# Patient Record
Sex: Male | Born: 1955
Health system: Southern US, Community
[De-identification: ages and names within clinical notes are randomized; demographics above are authoritative.]

## PROBLEM LIST (undated history)

## (undated) DIAGNOSIS — T7840XA Allergy, unspecified, initial encounter: Secondary | ICD-10-CM

## (undated) DIAGNOSIS — I10 Essential (primary) hypertension: Secondary | ICD-10-CM

## (undated) DIAGNOSIS — I700 Atherosclerosis of aorta: Secondary | ICD-10-CM

## (undated) DIAGNOSIS — IMO0002 Reserved for concepts with insufficient information to code with codable children: Secondary | ICD-10-CM

## (undated) HISTORY — DX: Allergy, unspecified, initial encounter: T78.40XA

## (undated) HISTORY — DX: Reserved for concepts with insufficient information to code with codable children: IMO0002

## (undated) HISTORY — DX: Atherosclerosis of aorta: I70.0

## (undated) HISTORY — DX: Essential (primary) hypertension: I10

---
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
## 2016-01-31 DIAGNOSIS — E785 Hyperlipidemia, unspecified: Secondary | ICD-10-CM

## 2016-01-31 HISTORY — DX: Hyperlipidemia, unspecified: E78.5

## 2020-11-24 ENCOUNTER — Ambulatory Visit (INDEPENDENT_AMBULATORY_CARE_PROVIDER_SITE_OTHER): Payer: Medicare HMO | Admitting: Family Medicine

## 2020-11-24 ENCOUNTER — Other Ambulatory Visit: Payer: Self-pay

## 2020-11-24 ENCOUNTER — Ambulatory Visit (INDEPENDENT_AMBULATORY_CARE_PROVIDER_SITE_OTHER): Payer: Medicare HMO

## 2020-11-24 ENCOUNTER — Encounter: Payer: Self-pay | Admitting: Family Medicine

## 2020-11-24 VITALS — BP 132/86 | HR 70 | Temp 98.5°F | Ht 69.0 in | Wt 209.6 lb

## 2020-11-24 DIAGNOSIS — R252 Cramp and spasm: Secondary | ICD-10-CM

## 2020-11-24 DIAGNOSIS — M19012 Primary osteoarthritis, left shoulder: Secondary | ICD-10-CM | POA: Diagnosis not present

## 2020-11-24 DIAGNOSIS — G5603 Carpal tunnel syndrome, bilateral upper limbs: Secondary | ICD-10-CM | POA: Diagnosis not present

## 2020-11-24 DIAGNOSIS — K219 Gastro-esophageal reflux disease without esophagitis: Secondary | ICD-10-CM

## 2020-11-24 DIAGNOSIS — G8929 Other chronic pain: Secondary | ICD-10-CM

## 2020-11-24 DIAGNOSIS — E785 Hyperlipidemia, unspecified: Secondary | ICD-10-CM | POA: Diagnosis not present

## 2020-11-24 DIAGNOSIS — Z114 Encounter for screening for human immunodeficiency virus [HIV]: Secondary | ICD-10-CM

## 2020-11-24 DIAGNOSIS — M25512 Pain in left shoulder: Secondary | ICD-10-CM | POA: Diagnosis not present

## 2020-11-24 DIAGNOSIS — Z125 Encounter for screening for malignant neoplasm of prostate: Secondary | ICD-10-CM | POA: Diagnosis not present

## 2020-11-24 DIAGNOSIS — Z1159 Encounter for screening for other viral diseases: Secondary | ICD-10-CM | POA: Diagnosis not present

## 2020-11-24 DIAGNOSIS — M1990 Unspecified osteoarthritis, unspecified site: Secondary | ICD-10-CM | POA: Diagnosis not present

## 2020-11-24 HISTORY — DX: Carpal tunnel syndrome, bilateral upper limbs: G56.03

## 2020-11-24 HISTORY — DX: Cramp and spasm: R25.2

## 2020-11-24 HISTORY — DX: Unspecified osteoarthritis, unspecified site: M19.90

## 2020-11-24 HISTORY — DX: Gastro-esophageal reflux disease without esophagitis: K21.9

## 2020-11-24 HISTORY — DX: Other chronic pain: G89.29

## 2020-11-24 MED ORDER — PANTOPRAZOLE SODIUM 40 MG PO TBEC
40.0000 mg | DELAYED_RELEASE_TABLET | Freq: Every day | ORAL | 1 refills | Status: DC
Start: 2020-11-24 — End: 2021-01-29

## 2020-11-24 MED ORDER — CYCLOBENZAPRINE HCL 10 MG PO TABS: 10.0000 mg | ORAL_TABLET | Freq: Every day | ORAL | 2 refills | Status: DC | PRN

## 2020-11-24 MED ORDER — MELOXICAM 15 MG PO TABS: 15.0000 mg | ORAL_TABLET | Freq: Every day | ORAL | 1 refills | Status: DC

## 2020-11-24 NOTE — Progress Notes (Signed)
New Patient Office Visit  Assessment & Plan:  1. Gastroesophageal reflux disease, unspecified whether esophagitis present Well controlled on current regimen.  - pantoprazole (PROTONIX) 40 MG tablet; Take 1 tablet (40 mg total) by mouth daily.  Dispense: 90 tablet; Refill: 1 - CMP14+EGFR; Future  2. Arthritis Well controlled on current regimen.  - meloxicam (MOBIC) 15 MG tablet; Take 1 tablet (15 mg total) by mouth daily.  Dispense: 90 tablet; Refill: 1 - CMP14+EGFR; Future  3. Leg cramps - cyclobenzaprine (FLEXERIL) 10 MG tablet; Take 1 tablet (10 mg total) by mouth daily as needed for muscle spasms.  Dispense: 30 tablet; Refill: 2 - CMP14+EGFR; Future - TSH; Future - Magnesium; Future - Anemia Profile B; Future  4. Bilateral carpal tunnel syndrome - Ambulatory referral to Orthopedic Surgery  5. Chronic left shoulder pain Exercises provided for patient to complete at home. - DG Shoulder Left - Ambulatory referral to Orthopedic Surgery - Ambulatory referral to Physical Therapy  6. Hyperlipidemia LDL goal <100 Labs to assess. - Lipid panel; Future  7. Prostate cancer screening - PSA, total and free; Future  8. Encounter for hepatitis C screening test for low risk patient - Hepatitis C antibody; Future  9. Encounter for screening for HIV - HIV antibody (with reflex); Future   Follow-up: Return in about 6 weeks (around 01/05/2021) for follow-up of chronic medication conditions.   Hendricks Limes, MSN, APRN, FNP-C [facility]  Subjective:  [name], male    DOB: July 21, 1956  Age: 65 y.o. MRN: [id number]  Patient Care Team: Loman Brooklyn, FNP as PCP - General (Family Medicine)  CC:  Chief Complaint  Patient presents with  . New Patient (Initial Visit)    [facility]   . Establish Care  . Shoulder Pain    Patient states he has been having ongoing left shoulder pain that has been ongoing- no known injury      HPI James Davenport presents to establish care.   GERD: Controlled with Protonix.  Patient states he takes Flexeril due to leg cramps that have been going on for years.  He takes meloxicam for arthritis pains in his back, shoulders, hands, and knees.  He does report his knees have improved greatly as he has lost 32 pounds in the past 3 months.  He has done this by cutting back on soda and portion control.  Patient reports he has carpal tunnel syndrome bilaterally but has never seen an orthopedic and was unaware surgery was an option.  He is also having chronic left shoulder pain that has been going on for years.  He reports he has probably fallen on it 10 times over the years working in [employer].  He has pain with both resting and activity.  He reports the day before yesterday was the worst day ever for this pain.  He has never done physical therapy.   Review of Systems  Constitutional: Positive for weight loss (intentional). Negative for chills, fever and malaise/fatigue.  HENT: Negative for congestion, ear discharge, ear pain, nosebleeds, sinus pain, sore throat and tinnitus.   Eyes: Negative for blurred vision, double vision, pain, discharge and redness.  Respiratory: Negative for cough, shortness of breath and wheezing.   Cardiovascular: Negative for chest pain, palpitations and leg swelling.  Gastrointestinal: Positive for heartburn. Negative for abdominal pain, constipation, diarrhea, nausea and vomiting.  Genitourinary: Negative for dysuria, frequency and urgency.       Denies trouble initiating a  urine stream, weak stream, split stream, nocturia, and dribbling.   Musculoskeletal: Positive for back pain and joint pain. Negative for myalgias.  Skin: Negative for rash.  Neurological: Negative for dizziness, seizures, weakness and headaches.  Psychiatric/Behavioral: Negative for depression, substance abuse and suicidal ideas. The patient is not nervous/anxious.     Current  Outpatient Medications:  .  acetaminophen (TYLENOL) 500 MG tablet, Take 500 mg by mouth every 6 (six) hours as needed., Disp: , Rfl:  .  aspirin 81 MG chewable tablet, Chew 1 tablet by mouth daily at 2 PM., Disp: , Rfl:  .  cyclobenzaprine (FLEXERIL) 10 MG tablet, Take 1 tablet (10 mg total) by mouth daily as needed for muscle spasms., Disp: 30 tablet, Rfl: 2 .  meloxicam (MOBIC) 15 MG tablet, Take 1 tablet (15 mg total) by mouth daily., Disp: 90 tablet, Rfl: 1 .  pantoprazole (PROTONIX) 40 MG tablet, Take 1 tablet (40 mg total) by mouth daily., Disp: 90 tablet, Rfl: 1  Allergies  Allergen Reactions  . Tramadol Nausea And Vomiting    Past Medical History:  Diagnosis Date  . Hypertension     Past Surgical History:  Procedure Laterality Date  . LEG SURGERY Right 1975    Family History  Problem Relation Age of Onset  . Lung disease Mother   . Hypertension Mother   . Asthma Daughter     Social History   Socioeconomic History  . Marital status: Single    Spouse name: Not on file  . Number of children: Not on file  . Years of education: Not on file  . Highest education level: Not on file  Occupational History  . Not on file  Tobacco Use  . Smoking status: Former Smoker    Types: Cigarettes    Quit date: 1990    Years since quitting: 32.1  . Smokeless tobacco: Never Used  Vaping Use  . Vaping Use: Never used  Substance and Sexual Activity  . Alcohol use: Yes    Comment: occ  . Drug use: Never  . Sexual activity: Not on file  Other Topics Concern  . Not on file  Social History Narrative  . Not on file   Social Determinants of Health   Financial Resource Strain: Not on file  Food Insecurity: Not on file  Transportation Needs: Not on file  Physical Activity: Not on file  Stress: Not on file  Social Connections: Not on file  Intimate Partner Violence: Not on file    Objective:   Today's Vitals: BP 132/86 Comment: manual  Pulse 70   Temp 98.5 F (36.9 C)  (Temporal)   Ht 5' 9" (1.753 m)   Wt 209 lb 9.6 oz (95.1 kg)   SpO2 93%   BMI 30.95 kg/m   Physical Exam Vitals reviewed.  Constitutional:      General: He is not in acute distress.    Appearance: Normal appearance. He is obese. He is not ill-appearing, toxic-appearing or diaphoretic.  HENT:     Head: Normocephalic and atraumatic.  Eyes:     General: No scleral icterus.       Right eye: No discharge.        Left eye: No discharge.     Conjunctiva/sclera: Conjunctivae normal.  Cardiovascular:     Rate and Rhythm: Normal rate and regular rhythm.     Heart sounds: Normal heart sounds. No murmur heard. No friction rub. No gallop.   Pulmonary:     Effort:  Pulmonary effort is normal. No respiratory distress.     Breath sounds: Normal breath sounds. No stridor. No wheezing, rhonchi or rales.  Musculoskeletal:     Left shoulder: Tenderness present. No swelling, deformity, effusion or laceration. Decreased range of motion.     Cervical back: Normal range of motion.  Skin:    General: Skin is warm and dry.  Neurological:     Mental Status: He is alert and oriented to person, place, and time. Mental status is at baseline.  Psychiatric:        Mood and Affect: Mood normal.        Behavior: Behavior normal.        Thought Content: Thought content normal.        Judgment: Judgment normal.

## 2020-11-24 NOTE — Patient Instructions (Signed)
Shoulder Exercises Ask your health care provider which exercises are safe for you. Do exercises exactly as told by your health care provider and adjust them as directed. It is normal to feel mild stretching, pulling, tightness, or discomfort as you do these exercises. Stop right away if you feel sudden pain or your pain gets worse. Do not begin these exercises until told by your health care provider. Stretching exercises External rotation and abduction This exercise is sometimes called corner stretch. This exercise rotates your arm outward (external rotation) and moves your arm out from your body (abduction). 1. Stand in a doorway with one of your feet slightly in front of the other. This is called a staggered stance. If you cannot reach your forearms to the door frame, stand facing a corner of a room. 2. Choose one of the following positions as told by your health care provider: ? Place your hands and forearms on the door frame above your head. ? Place your hands and forearms on the door frame at the height of your head. ? Place your hands on the door frame at the height of your elbows. 3. Slowly move your weight onto your front foot until you feel a stretch across your chest and in the front of your shoulders. Keep your head and chest upright and keep your abdominal muscles tight. 4. Hold for __________ seconds. 5. To release the stretch, shift your weight to your back foot. Repeat __________ times. Complete this exercise __________ times a day.   Extension, standing 1. Stand and hold a broomstick, a cane, or a similar object behind your back. ? Your hands should be a little wider than shoulder width apart. ? Your palms should face away from your back. 2. Keeping your elbows straight and your shoulder muscles relaxed, move the stick away from your body until you feel a stretch in your shoulders (extension). ? Avoid shrugging your shoulders while you move the stick. Keep your shoulder blades  tucked down toward the middle of your back. 3. Hold for __________ seconds. 4. Slowly return to the starting position. Repeat __________ times. Complete this exercise __________ times a day. Range-of-motion exercises Pendulum 1. Stand near a wall or a surface that you can hold onto for balance. 2. Bend at the waist and let your left / right arm hang straight down. Use your other arm to support you. Keep your back straight and do not lock your knees. 3. Relax your left / right arm and shoulder muscles, and move your hips and your trunk so your left / right arm swings freely. Your arm should swing because of the motion of your body, not because you are using your arm or shoulder muscles. 4. Keep moving your hips and trunk so your arm swings in the following directions, as told by your health care provider: ? Side to side. ? Forward and backward. ? In clockwise and counterclockwise circles. 5. Continue each motion for __________ seconds, or for as long as told by your health care provider. 6. Slowly return to the starting position. Repeat __________ times. Complete this exercise __________ times a day.   Shoulder flexion, standing 1. Stand and hold a broomstick, a cane, or a similar object. Place your hands a little more than shoulder width apart on the object. Your left / right hand should be palm up, and your other hand should be palm down. 2. Keep your elbow straight and your shoulder muscles relaxed. Push the stick up with your healthy   arm to raise your left / right arm in front of your body, and then over your head until you feel a stretch in your shoulder (flexion). ? Avoid shrugging your shoulder while you raise your arm. Keep your shoulder blade tucked down toward the middle of your back. 3. Hold for __________ seconds. 4. Slowly return to the starting position. Repeat __________ times. Complete this exercise __________ times a day.   Shoulder abduction, standing 1. Stand and hold a  broomstick, a cane, or a similar object. Place your hands a little more than shoulder width apart on the object. Your left / right hand should be palm up, and your other hand should be palm down. 2. Keep your elbow straight and your shoulder muscles relaxed. Push the object across your body toward your left / right side. Raise your left / right arm to the side of your body (abduction) until you feel a stretch in your shoulder. ? Do not raise your arm above shoulder height unless your health care provider tells you to do that. ? If directed, raise your arm over your head. ? Avoid shrugging your shoulder while you raise your arm. Keep your shoulder blade tucked down toward the middle of your back. 3. Hold for __________ seconds. 4. Slowly return to the starting position. Repeat __________ times. Complete this exercise __________ times a day. Internal rotation 1. Place your left / right hand behind your back, palm up. 2. Use your other hand to dangle an exercise band, a towel, or a similar object over your shoulder. Grasp the band with your left / right hand so you are holding on to both ends. 3. Gently pull up on the band until you feel a stretch in the front of your left / right shoulder. The movement of your arm toward the center of your body is called internal rotation. ? Avoid shrugging your shoulder while you raise your arm. Keep your shoulder blade tucked down toward the middle of your back. 4. Hold for __________ seconds. 5. Release the stretch by letting go of the band and lowering your hands. Repeat __________ times. Complete this exercise __________ times a day.   Strengthening exercises External rotation 1. Sit in a stable chair without armrests. 2. Secure an exercise band to a stable object at elbow height on your left / right side. 3. Place a soft object, such as a folded towel or a small pillow, between your left / right upper arm and your body to move your elbow about 4 inches (10  cm) away from your side. 4. Hold the end of the exercise band so it is tight and there is no slack. 5. Keeping your elbow pressed against the soft object, slowly move your forearm out, away from your abdomen (external rotation). Keep your body steady so only your forearm moves. 6. Hold for __________ seconds. 7. Slowly return to the starting position. Repeat __________ times. Complete this exercise __________ times a day.   Shoulder abduction 1. Sit in a stable chair without armrests, or stand up. 2. Hold a __________ weight in your left / right hand, or hold an exercise band with both hands. 3. Start with your arms straight down and your left / right palm facing in, toward your body. 4. Slowly lift your left / right hand out to your side (abduction). Do not lift your hand above shoulder height unless your health care provider tells you that this is safe. ? Keep your arms straight. ? Avoid   shrugging your shoulder while you do this movement. Keep your shoulder blade tucked down toward the middle of your back. 5. Hold for __________ seconds. 6. Slowly lower your arm, and return to the starting position. Repeat __________ times. Complete this exercise __________ times a day.   Shoulder extension 1. Sit in a stable chair without armrests, or stand up. 2. Secure an exercise band to a stable object in front of you so it is at shoulder height. 3. Hold one end of the exercise band in each hand. Your palms should face each other. 4. Straighten your elbows and lift your hands up to shoulder height. 5. Step back, away from the secured end of the exercise band, until the band is tight and there is no slack. 6. Squeeze your shoulder blades together as you pull your hands down to the sides of your thighs (extension). Stop when your hands are straight down by your sides. Do not let your hands go behind your body. 7. Hold for __________ seconds. 8. Slowly return to the starting position. Repeat __________  times. Complete this exercise __________ times a day. Shoulder row 1. Sit in a stable chair without armrests, or stand up. 2. Secure an exercise band to a stable object in front of you so it is at waist height. 3. Hold one end of the exercise band in each hand. Position your palms so that your thumbs are facing the ceiling (neutral position). 4. Bend each of your elbows to a 90-degree angle (right angle) and keep your upper arms at your sides. 5. Step back until the band is tight and there is no slack. 6. Slowly pull your elbows back behind you. 7. Hold for __________ seconds. 8. Slowly return to the starting position. Repeat __________ times. Complete this exercise __________ times a day. Shoulder press-ups 1. Sit in a stable chair that has armrests. Sit upright, with your feet flat on the floor. 2. Put your hands on the armrests so your elbows are bent and your fingers are pointing forward. Your hands should be about even with the sides of your body. 3. Push down on the armrests and use your arms to lift yourself off the chair. Straighten your elbows and lift yourself up as much as you comfortably can. ? Move your shoulder blades down, and avoid letting your shoulders move up toward your ears. ? Keep your feet on the ground. As you get stronger, your feet should support less of your body weight as you lift yourself up. 4. Hold for __________ seconds. 5. Slowly lower yourself back into the chair. Repeat __________ times. Complete this exercise __________ times a day.   Wall push-ups 1. Stand so you are facing a stable wall. Your feet should be about one arm-length away from the wall. 2. Lean forward and place your palms on the wall at shoulder height. 3. Keep your feet flat on the floor as you bend your elbows and lean forward toward the wall. 4. Hold for __________ seconds. 5. Straighten your elbows to push yourself back to the starting position. Repeat __________ times. Complete this  exercise __________ times a day.   This information is not intended to replace advice given to you by your health care provider. Make sure you discuss any questions you have with your health care provider. Document Revised: 01/15/2019 Document Reviewed: 10/23/2018 Elsevier Patient Education  2021 [employer]  

## 2020-11-26 ENCOUNTER — Encounter: Payer: Self-pay | Admitting: Family Medicine

## 2020-11-26 DIAGNOSIS — I700 Atherosclerosis of aorta: Secondary | ICD-10-CM | POA: Insufficient documentation

## 2020-11-29 ENCOUNTER — Other Ambulatory Visit: Payer: Self-pay

## 2020-11-29 ENCOUNTER — Other Ambulatory Visit: Payer: Medicare HMO

## 2020-11-29 DIAGNOSIS — Z1159 Encounter for screening for other viral diseases: Secondary | ICD-10-CM

## 2020-11-29 DIAGNOSIS — Z114 Encounter for screening for human immunodeficiency virus [HIV]: Secondary | ICD-10-CM | POA: Diagnosis not present

## 2020-11-29 DIAGNOSIS — Z125 Encounter for screening for malignant neoplasm of prostate: Secondary | ICD-10-CM | POA: Diagnosis not present

## 2020-11-29 DIAGNOSIS — R252 Cramp and spasm: Secondary | ICD-10-CM

## 2020-11-29 DIAGNOSIS — M1990 Unspecified osteoarthritis, unspecified site: Secondary | ICD-10-CM | POA: Diagnosis not present

## 2020-11-29 DIAGNOSIS — K219 Gastro-esophageal reflux disease without esophagitis: Secondary | ICD-10-CM

## 2020-11-29 DIAGNOSIS — E785 Hyperlipidemia, unspecified: Secondary | ICD-10-CM | POA: Diagnosis not present

## 2020-11-30 LAB — ANEMIA PROFILE B
Basophils Absolute: 0.1 10*3/uL (ref 0.0–0.2)
Basos: 1 %
EOS (ABSOLUTE): 0.2 10*3/uL (ref 0.0–0.4)
Eos: 2 %
Ferritin: 104 ng/mL (ref 30–400)
Folate: 4.5 ng/mL (ref >3.0)
Hematocrit: 48.7 % (ref 37.5–51.0)
Hemoglobin: 16.4 g/dL (ref 13.0–17.7)
Immature Grans (Abs): 0 10*3/uL (ref 0.0–0.1)
Immature Granulocytes: 0 %
Iron Saturation: 28 % (ref 15–55)
Iron: 83 ug/dL (ref 38–169)
Lymphocytes Absolute: 2.2 10*3/uL (ref 0.7–3.1)
Lymphs: 33 %
MCH: 29.1 pg (ref 26.6–33.0)
MCHC: 33.7 g/dL (ref 31.5–35.7)
MCV: 87 fL (ref 79–97)
Monocytes Absolute: 0.6 10*3/uL (ref 0.1–0.9)
Monocytes: 9 %
Neutrophils Absolute: 3.6 10*3/uL (ref 1.4–7.0)
Neutrophils: 55 %
Platelets: 219 10*3/uL (ref 150–450)
RBC: 5.63 x10E6/uL (ref 4.14–5.80)
RDW: 13 % (ref 11.6–15.4)
Retic Ct Pct: 1.8 % (ref 0.6–2.6)
Total Iron Binding Capacity: 297 ug/dL (ref 250–450)
UIBC: 214 ug/dL (ref 111–343)
Vitamin B-12: 375 pg/mL (ref 232–1245)
WBC: 6.7 10*3/uL (ref 3.4–10.8)

## 2020-11-30 LAB — LIPID PANEL
Chol/HDL Ratio: 3.8 ratio (ref 0.0–5.0)
Cholesterol, Total: 188 mg/dL (ref 100–199)
HDL: 50 mg/dL (ref >39)
LDL Chol Calc (NIH): 119 mg/dL — ABNORMAL HIGH (ref 0–99)
Triglycerides: 105 mg/dL (ref 0–149)
VLDL Cholesterol Cal: 19 mg/dL (ref 5–40)

## 2020-11-30 LAB — PSA, TOTAL AND FREE
PSA, Free Pct: 24.3 %
PSA, Free: 0.17 ng/mL
Prostate Specific Ag, Serum: 0.7 ng/mL (ref 0.0–4.0)

## 2020-11-30 LAB — CMP14+EGFR
ALT: 27 IU/L (ref 0–44)
AST: 27 IU/L (ref 0–40)
Albumin/Globulin Ratio: 1.7 (ref 1.2–2.2)
Albumin: 4.2 g/dL (ref 3.8–4.8)
Alkaline Phosphatase: 81 IU/L (ref 44–121)
BUN/Creatinine Ratio: 20 (ref 10–24)
BUN: 20 mg/dL (ref 8–27)
Bilirubin Total: 0.4 mg/dL (ref 0.0–1.2)
CO2: 20 mmol/L (ref 20–29)
Calcium: 8.8 mg/dL (ref 8.6–10.2)
Chloride: 104 mmol/L (ref 96–106)
Creatinine, Ser: 0.98 mg/dL (ref 0.76–1.27)
GFR calc Af Amer: 93 mL/min/{1.73_m2} (ref >59)
GFR calc non Af Amer: 81 mL/min/{1.73_m2} (ref >59)
Globulin, Total: 2.5 g/dL (ref 1.5–4.5)
Glucose: 100 mg/dL — ABNORMAL HIGH (ref 65–99)
Potassium: 4.5 mmol/L (ref 3.5–5.2)
Sodium: 142 mmol/L (ref 134–144)
Total Protein: 6.7 g/dL (ref 6.0–8.5)

## 2020-11-30 LAB — TSH: TSH: 1.26 u[IU]/mL (ref 0.450–4.500)

## 2020-11-30 LAB — HIV ANTIBODY (ROUTINE TESTING W REFLEX): HIV Screen 4th Generation wRfx: NONREACTIVE

## 2020-11-30 LAB — HEPATITIS C ANTIBODY: Hep C Virus Ab: <0.1 s/co ratio (ref 0.0–0.9)

## 2020-11-30 LAB — MAGNESIUM: Magnesium: 1.9 mg/dL (ref 1.6–2.3)

## 2020-12-01 ENCOUNTER — Telehealth: Payer: Self-pay | Admitting: Family Medicine

## 2020-12-01 DIAGNOSIS — E785 Hyperlipidemia, unspecified: Secondary | ICD-10-CM

## 2020-12-01 MED ORDER — ATORVASTATIN CALCIUM 10 MG PO TABS: 10.0000 mg | ORAL_TABLET | Freq: Every day | ORAL | 2 refills | Status: DC

## 2020-12-01 NOTE — Telephone Encounter (Signed)
Rx'd Atorvastatin.

## 2020-12-01 NOTE — Telephone Encounter (Signed)
Lmtcb.

## 2020-12-01 NOTE — Telephone Encounter (Signed)
Pt calling to let Britney know that he is willing to try whichever cholesterol medication she thinks what be best for him

## 2020-12-05 ENCOUNTER — Encounter: Payer: Self-pay | Admitting: Orthopedic Surgery

## 2020-12-05 ENCOUNTER — Ambulatory Visit (INDEPENDENT_AMBULATORY_CARE_PROVIDER_SITE_OTHER): Payer: Medicare HMO | Admitting: Orthopedic Surgery

## 2020-12-05 ENCOUNTER — Other Ambulatory Visit: Payer: Self-pay

## 2020-12-05 VITALS — BP 175/101 | HR 79 | Ht 69.0 in | Wt 210.0 lb

## 2020-12-05 DIAGNOSIS — M19012 Primary osteoarthritis, left shoulder: Secondary | ICD-10-CM

## 2020-12-05 DIAGNOSIS — G5603 Carpal tunnel syndrome, bilateral upper limbs: Secondary | ICD-10-CM

## 2020-12-05 NOTE — Progress Notes (Signed)
New Patient Visit  Assessment: James Davenport is a 65 y.o. male with the following: Left shoulder glenohumeral arthritis Bilateral carpal tunnel syndrome; right worse than left, symptoms currently mild  Plan: James Davenport has multiple complaints in clinic today.  He states that his left shoulder is bothering him the most.  We reviewed the radiographs which demonstrates glenohumeral arthritis.  He is taking the appropriate medications, and continues to be active.  We briefly discussed proceeding with a steroid injection should his symptoms worsen.  He can contact the clinic if he wishes to proceed with an injection at any time.  Regarding his numbness and tingling in bilateral hands, the right is currently worse than the left.  His description of symptoms are overall mild, but consistent with carpal tunnel syndrome.  We discussed trying a cock-up wrist splint to be worn at night, and he is elected to proceed.  He was fitted for this brace today.  Should he have any issues in the future, I have asked him to contact the clinic for follow-up appointment.   Follow-up: No follow-ups on file.  Subjective:  Chief Complaint  Patient presents with  . Hand Pain    Bilateral hand pain, Patient reports hands have been worse in last few weeks.   . Shoulder Pain    Left shoulder pain, Patient reports that left shoulder has been having some numbness and tingling worse in the last week,     History of Present Illness: James Davenport is a 65 y.o. male who has been referred to clinic today by Delight Ovens, [place] for evaluation of bilateral hand pain.  In addition, he has been having left shoulder pain.  His left shoulder is currently most problematic.  He states approximately 1 week ago, he he had an acute worsening.  Otherwise, he has had pain in his left shoulder for several years.  No specific injury.  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  He has been taking NSAIDs, as well as Tylenol,  which does help with some of his pain.  Occasionally, will keep him up at night.  He localizes the pain to the superior aspect of the shoulder, and notes that it is deep within the shoulder joint.  In regards to his hands, he notes some sharp pains, specifically when he is doing lots of work with his hands.  In addition, he has some numbness and tingling.  Occasionally, this will be worse at night.  He also notes worsening of his symptoms if he is driving for extended periods of time.  If he is doing work, he has been wearing a brace, primarily stabilizing his right thumb.  No brace at nighttime.   Review of Systems: No fevers or chills + numbness or tingling No chest pain No shortness of breath No bowel or bladder dysfunction No GI distress No headaches   Medical History:  Past Medical History:  Diagnosis Date  . Aortic atherosclerosis ([place])   . Arthritis 11/24/2020  . Bilateral carpal tunnel syndrome 11/24/2020  . Chronic left shoulder pain 11/24/2020  . GERD (gastroesophageal reflux disease) 11/24/2020  . Hyperlipidemia LDL goal <100 01/31/2016  . Hypertension   . Leg cramps 11/24/2020    Past Surgical History:  Procedure Laterality Date  . LEG SURGERY Right 1975    Family History  Problem Relation Age of Onset  . Lung disease Mother   . Hypertension Mother   . Asthma Daughter    Social History   Tobacco  Use  . Smoking status: Former Smoker    Types: Cigarettes    Quit date: 1990    Years since quitting: 32.1  . Smokeless tobacco: Never Used  Vaping Use  . Vaping Use: Never used  Substance Use Topics  . Alcohol use: Yes    Comment: occ  . Drug use: Never    Allergies  Allergen Reactions  . Tramadol Nausea And Vomiting    Current Meds  Medication Sig  . acetaminophen (TYLENOL) 500 MG tablet Take 500 mg by mouth every 6 (six) hours as needed.  Marland Kitchen aspirin 81 MG chewable tablet Chew 1 tablet by mouth daily at 2 PM.  . atorvastatin (LIPITOR) 10 MG tablet Take 1  tablet (10 mg total) by mouth daily.  . cyclobenzaprine (FLEXERIL) 10 MG tablet Take 1 tablet (10 mg total) by mouth daily as needed for muscle spasms.  . meloxicam (MOBIC) 15 MG tablet Take 1 tablet (15 mg total) by mouth daily.  . pantoprazole (PROTONIX) 40 MG tablet Take 1 tablet (40 mg total) by mouth daily.    Objective: BP (!) 175/101   Pulse 79   Ht 5\' 9"  (1.753 m)   Wt 210 lb (95.3 kg)   BMI 31.01 kg/m   Physical Exam:  General: Alert and oriented, no acute distress Gait: Normal  Evaluation of his left shoulder demonstrates no deformity.  No atrophy is appreciated.  Forward flexion limited to 130 degrees, with some discomfort.  Internal rotation of the lumbar spine.  External rotation at his side limited to 25 degrees with some obvious discomfort.  Negative belly press.  5/5 infraspinatus.  4+/5 supraspinatus.  Evaluation of bilateral wrist demonstrates no deformity.  Grip strength is even bilaterally.  Mild positive Tinel's bilaterally.  Mild symptoms elicited with carpal tunnel compression testing.  He does have some arthritis throughout his fingers.  Sensation is intact otherwise.  Fingers are warm and well perfused.    IMAGING: I personally reviewed images previously obtained in clinic   A single AP view of the left shoulder was evaluated and demonstrates moderate glenohumeral arthritis with severe loss of joint space in the inferior aspect of the glenohumeral joint.  There is also inferior osteophytes on the humeral head, as well as the glenoid.   New Medications:  No orders of the defined types were placed in this encounter.     Mordecai Rasmussen, MD  12/05/2020 9:44 AM

## 2020-12-26 ENCOUNTER — Other Ambulatory Visit: Payer: Self-pay | Admitting: Family Medicine

## 2020-12-26 DIAGNOSIS — E785 Hyperlipidemia, unspecified: Secondary | ICD-10-CM

## 2020-12-26 NOTE — Telephone Encounter (Signed)
Pt has been seen since this telephone call, will close encounter.

## 2021-01-09 ENCOUNTER — Ambulatory Visit (INDEPENDENT_AMBULATORY_CARE_PROVIDER_SITE_OTHER): Payer: Medicare HMO | Admitting: Family Medicine

## 2021-01-09 ENCOUNTER — Encounter: Payer: Self-pay | Admitting: Family Medicine

## 2021-01-09 ENCOUNTER — Other Ambulatory Visit: Payer: Self-pay

## 2021-01-09 VITALS — BP 132/84 | HR 68 | Temp 97.9°F | Ht 69.0 in | Wt 214.6 lb

## 2021-01-09 DIAGNOSIS — Z1211 Encounter for screening for malignant neoplasm of colon: Secondary | ICD-10-CM | POA: Diagnosis not present

## 2021-01-09 DIAGNOSIS — L219 Seborrheic dermatitis, unspecified: Secondary | ICD-10-CM | POA: Diagnosis not present

## 2021-01-09 DIAGNOSIS — Z86010 Personal history of colonic polyps: Secondary | ICD-10-CM

## 2021-01-09 DIAGNOSIS — J302 Other seasonal allergic rhinitis: Secondary | ICD-10-CM

## 2021-01-09 DIAGNOSIS — E785 Hyperlipidemia, unspecified: Secondary | ICD-10-CM

## 2021-01-09 DIAGNOSIS — I700 Atherosclerosis of aorta: Secondary | ICD-10-CM | POA: Diagnosis not present

## 2021-01-09 MED ORDER — KETOCONAZOLE 2 % EX CREA
1.0000 | TOPICAL_CREAM | Freq: Every day | CUTANEOUS | 0 refills | Status: DC
Start: 2021-01-09 — End: 2021-02-09

## 2021-01-09 MED ORDER — FLUTICASONE PROPIONATE 50 MCG/ACT NA SUSP: 2.0000 | Freq: Every day | NASAL | 6 refills | Status: DC

## 2021-01-09 MED ORDER — KETOCONAZOLE 2 % EX SHAM: 1.0000 "application " | MEDICATED_SHAMPOO | CUTANEOUS | 0 refills | Status: DC

## 2021-01-09 MED ORDER — PRAVASTATIN SODIUM 40 MG PO TABS: 40.0000 mg | ORAL_TABLET | Freq: Every day | ORAL | 2 refills | Status: DC

## 2021-01-09 MED ORDER — LEVOCETIRIZINE DIHYDROCHLORIDE 5 MG PO TABS: 5.0000 mg | ORAL_TABLET | Freq: Every evening | ORAL | 2 refills | Status: DC

## 2021-01-09 NOTE — Patient Instructions (Signed)
Seborrheic Dermatitis, Adult Seborrheic dermatitis is a skin disease that causes red, scaly patches. It usually occurs on the scalp, and it is often called dandruff. The patches may appear on other parts of the body. Skin patches tend to appear where there are many oil glands in the skin. Areas of the body that are commonly affected include the:  Scalp.  Ears.  Eyebrows.  Face.  Bearded area of [employer].  Skin folds of the body, such as the armpits, groin, and buttocks.  Chest. The condition may come and go for no known reason, and it is often long-lasting (chronic). What are the causes? The cause of this condition is not known. What increases the risk? The following factors may make you more likely to develop this condition:  Having certain conditions, such as: ? HIV (human immunodeficiency virus). ? AIDS (acquired immunodeficiency syndrome). ? Parkinson's disease. ? Mood disorders, such as depression.  Being 72-23 years old. What are the signs or symptoms? Symptoms of this condition include:  Thick scales on the scalp.  Redness on the face or in the armpits.  Skin that is flaky. The flakes may be white or yellow.  Skin that seems oily or dry but is not helped with moisturizers.  Itching or burning in the affected areas.   How is this diagnosed? This condition is diagnosed with a medical history and physical exam. A sample of your skin may be tested (skin biopsy). You may need to see a skin specialist (dermatologist). How is this treated? There is no cure for this condition, but treatment can help to manage the symptoms. You may get treatment to remove scales, lower the risk of skin infection, and reduce swelling or itching. Treatment may include:  Creams that reduce skin yeast.  Medicated shampoo.  Moisturizing creams or ointments.  Creams that reduce swelling and irritation (steroids). Follow these instructions at home:  Apply over-the-counter and  prescription medicines only as told by your health care provider.  Use any medicated shampoo, skin creams, or ointments only as told by your health care provider.  Keep all follow-up visits as told by your health care provider. This is important. Contact a health care provider if:  Your symptoms do not improve with treatment.  Your symptoms get worse.  You have new symptoms. Get help right away if:  Your condition rapidly worsens with treatment. Summary  Seborrheic dermatitis is a skin disease that causes red, scaly patches.  Seborrheic dermatitis commonly affects the scalp, face, and skin folds.  There is no cure for this condition, but treatment can help to manage the symptoms. This information is not intended to replace advice given to you by your health care provider. Make sure you discuss any questions you have with your health care provider. Document Revised: 07/01/2019 Document Reviewed: 07/01/2019 Elsevier Patient Education  [place].

## 2021-01-09 NOTE — Progress Notes (Signed)
Assessment & Plan:  1. Hyperlipidemia LDL goal <100 Lipid panel 11/29/2020 with an elevated LDL of 119.  Will repeat at his next visit.  He was unable to tolerate atorvastatin due to increased joint pains: Advised to discontinue.  Started patient on pravastatin. - pravastatin (PRAVACHOL) 40 MG tablet; Take 1 tablet (40 mg total) by mouth daily.  Dispense: 30 tablet; Refill: 2  2. Aortic atherosclerosis ([place]) He was unable to tolerate atorvastatin due to increased joint pains: Advised to discontinue.  Started patient on pravastatin. - pravastatin (PRAVACHOL) 40 MG tablet; Take 1 tablet (40 mg total) by mouth daily.  Dispense: 30 tablet; Refill: 2  3. Seasonal allergies Uncontrolled.  Encouraged Xyzal and Flonase both once daily with 1 in the AM and 1 in the PM.  Discussed possibility of adding Singulair at his next visit if his allergies are not controlled. - levocetirizine (XYZAL) 5 MG tablet; Take 1 tablet (5 mg total) by mouth every evening.  Dispense: 30 tablet; Refill: 2 - fluticasone (FLONASE) 50 MCG/ACT nasal spray; Place 2 sprays into both nostrils daily.  Dispense: 16 g; Refill: 6  4. Seborrheic dermatitis Education provided on seborrheic dermatitis. - ketoconazole (NIZORAL) 2 % cream; Apply 1 application topically daily.  Dispense: 30 g; Refill: 0 - ketoconazole (NIZORAL) 2 % shampoo; Apply 1 application topically 2 (two) times a week. Leave on for 3-5 minutes before rinsing.  Dispense: 120 mL; Refill: 0  5. Colon cancer screening - Ambulatory referral to Gastroenterology  6. History of colon polyps Patient reports a previous colonoscopy 8 to 10 years ago at a place across from [facility] in [place] where he did have a polyp removed.  Record request signed for [facility], although he is not positive this is the name of the place. - Ambulatory referral to Gastroenterology   Return in about 2 months (around 03/11/2021) for follow-up of chronic medication  conditions.  Hendricks Limes, MSN, APRN, FNP-C [facility]  Subjective:    [name], male    DOB: 1955-10-27, 65 y.o.   MRN: [id number]  Patient Care Team: Loman Brooklyn, FNP as PCP - General (Family Medicine)   Chief Complaint:  Chief Complaint  Patient presents with  . Gastroesophageal Reflux  . Hyperlipidemia    Check up of chronic medical conditions.  Patient states that he has been having more joint pain- could it be from cholesterol medication ?   Marland Kitchen Allergies    OTC not working    HPI: James Davenport is a 65 y.o. male presenting on 01/09/2021 for Gastroesophageal Reflux, Hyperlipidemia (Check up of chronic medical conditions.  Patient states that he has been having more joint pain- could it be from cholesterol medication ? ), and Allergies (OTC not working)  Hyperlipidemia/aortic atherosclerosis: Patient was previously started on atorvastatin and reports since starting the medication he has had an increase in joint pains.  Allergies: Patient is currently taking Zyrtec for his allergies which he reports gives him a fair amount of relief from his occasional cough, itchy throat, and sensation of having sand in his eyes.  He is hoping for something better.  New complaints: Patient is concerned about places on his face.  He reports in the past he has froze them with refrigerant coolant.  They are also in his beard and on his scalp.  Social history:  Relevant past medical, surgical, family and social history reviewed and updated as indicated. Interim medical history since our last visit  reviewed.  Allergies and medications reviewed and updated.  DATA REVIEWED: CHART IN EPIC  ROS: Negative unless specifically indicated above in HPI.    Current Outpatient Medications:  .  acetaminophen (TYLENOL) 500 MG tablet, Take 500 mg by mouth every 6 (six) hours as needed., Disp: , Rfl:  .  aspirin 81 MG chewable tablet, Chew 1 tablet by  mouth daily at 2 PM., Disp: , Rfl:  .  atorvastatin (LIPITOR) 10 MG tablet, TAKE 1 TABLET BY MOUTH EVERY DAY, Disp: 30 tablet, Rfl: 2 .  cyclobenzaprine (FLEXERIL) 10 MG tablet, Take 1 tablet (10 mg total) by mouth daily as needed for muscle spasms., Disp: 30 tablet, Rfl: 2 .  meloxicam (MOBIC) 15 MG tablet, Take 1 tablet (15 mg total) by mouth daily., Disp: 90 tablet, Rfl: 1 .  pantoprazole (PROTONIX) 40 MG tablet, Take 1 tablet (40 mg total) by mouth daily., Disp: 90 tablet, Rfl: 1   Allergies  Allergen Reactions  . Tramadol Nausea And Vomiting   Past Medical History:  Diagnosis Date  . Aortic atherosclerosis ([place])   . Arthritis 11/24/2020  . Bilateral carpal tunnel syndrome 11/24/2020  . Chronic left shoulder pain 11/24/2020  . GERD (gastroesophageal reflux disease) 11/24/2020  . Hyperlipidemia LDL goal <100 01/31/2016  . Hypertension   . Leg cramps 11/24/2020    Past Surgical History:  Procedure Laterality Date  . LEG SURGERY Right 1975    Social History   Socioeconomic History  . Marital status: Single    Spouse name: Not on file  . Number of children: Not on file  . Years of education: Not on file  . Highest education level: Not on file  Occupational History  . Not on file  Tobacco Use  . Smoking status: Former Smoker    Types: Cigarettes    Quit date: 1990    Years since quitting: 32.2  . Smokeless tobacco: Never Used  Vaping Use  . Vaping Use: Never used  Substance and Sexual Activity  . Alcohol use: Yes    Comment: occ  . Drug use: Never  . Sexual activity: Not on file  Other Topics Concern  . Not on file  Social History Narrative  . Not on file   Social Determinants of Health   Financial Resource Strain: Not on file  Food Insecurity: Not on file  Transportation Needs: Not on file  Physical Activity: Not on file  Stress: Not on file  Social Connections: Not on file  Intimate Partner Violence: Not on file        Objective:    BP 132/84   Pulse 68    Temp 97.9 F (36.6 C) (Temporal)   Ht 5\' 9"  (1.753 m)   Wt 214 lb 9.6 oz (97.3 kg)   SpO2 94%   BMI 31.69 kg/m   Wt Readings from Last 3 Encounters:  01/09/21 214 lb 9.6 oz (97.3 kg)  12/05/20 210 lb (95.3 kg)  11/24/20 209 lb 9.6 oz (95.1 kg)    Physical Exam Vitals reviewed.  Constitutional:      General: He is not in acute distress.    Appearance: Normal appearance. He is obese. He is not ill-appearing, toxic-appearing or diaphoretic.  HENT:     Head: Normocephalic and atraumatic.  Eyes:     General: No scleral icterus.       Right eye: No discharge.        Left eye: No discharge.     Conjunctiva/sclera: Conjunctivae normal.  Cardiovascular:     Rate and Rhythm: Normal rate.  Pulmonary:     Effort: Pulmonary effort is normal. No respiratory distress.  Musculoskeletal:        General: Normal range of motion.     Cervical back: Normal range of motion.  Skin:    General: Skin is warm and dry.     Comments: Mildly erythematous patches with flaky skin on his face, and his beard, and on his scalp.  Neurological:     Mental Status: He is alert and oriented to person, place, and time. Mental status is at baseline.  Psychiatric:        Mood and Affect: Mood normal.        Behavior: Behavior normal.        Thought Content: Thought content normal.        Judgment: Judgment normal.     Lab Results  Component Value Date   TSH 1.260 11/29/2020   Lab Results  Component Value Date   WBC 6.7 11/29/2020   HGB 16.4 11/29/2020   HCT 48.7 11/29/2020   MCV 87 11/29/2020   PLT 219 11/29/2020   Lab Results  Component Value Date   NA 142 11/29/2020   K 4.5 11/29/2020   CO2 20 11/29/2020   GLUCOSE 100 (H) 11/29/2020   BUN 20 11/29/2020   CREATININE 0.98 11/29/2020   BILITOT 0.4 11/29/2020   ALKPHOS 81 11/29/2020   AST 27 11/29/2020   ALT 27 11/29/2020   PROT 6.7 11/29/2020   ALBUMIN 4.2 11/29/2020   CALCIUM 8.8 11/29/2020   Lab Results  Component Value Date    CHOL 188 11/29/2020   Lab Results  Component Value Date   HDL 50 11/29/2020   Lab Results  Component Value Date   LDLCALC 119 (H) 11/29/2020   Lab Results  Component Value Date   TRIG 105 11/29/2020   Lab Results  Component Value Date   CHOLHDL 3.8 11/29/2020   No results found for: HGBA1C

## 2021-01-15 ENCOUNTER — Encounter: Payer: Self-pay | Admitting: Internal Medicine

## 2021-01-24 ENCOUNTER — Encounter: Payer: Self-pay | Admitting: *Deleted

## 2021-01-26 ENCOUNTER — Encounter: Payer: Self-pay | Admitting: Family Medicine

## 2021-01-29 ENCOUNTER — Other Ambulatory Visit: Payer: Self-pay

## 2021-01-29 DIAGNOSIS — M1990 Unspecified osteoarthritis, unspecified site: Secondary | ICD-10-CM

## 2021-01-29 DIAGNOSIS — K219 Gastro-esophageal reflux disease without esophagitis: Secondary | ICD-10-CM

## 2021-01-29 MED ORDER — PANTOPRAZOLE SODIUM 40 MG PO TBEC: 40.0000 mg | DELAYED_RELEASE_TABLET | Freq: Every day | ORAL | 1 refills | Status: DC

## 2021-01-29 MED ORDER — MELOXICAM 15 MG PO TABS: 15.0000 mg | ORAL_TABLET | Freq: Every day | ORAL | 1 refills | Status: DC

## 2021-02-09 ENCOUNTER — Other Ambulatory Visit: Payer: Self-pay

## 2021-02-09 DIAGNOSIS — L219 Seborrheic dermatitis, unspecified: Secondary | ICD-10-CM

## 2021-02-09 MED ORDER — KETOCONAZOLE 2 % EX CREA: 1.0000 "application " | TOPICAL_CREAM | Freq: Every day | CUTANEOUS | 0 refills | Status: DC

## 2021-02-12 ENCOUNTER — Other Ambulatory Visit: Payer: Self-pay | Admitting: *Deleted

## 2021-02-12 DIAGNOSIS — E785 Hyperlipidemia, unspecified: Secondary | ICD-10-CM

## 2021-02-12 DIAGNOSIS — K219 Gastro-esophageal reflux disease without esophagitis: Secondary | ICD-10-CM

## 2021-02-12 DIAGNOSIS — I700 Atherosclerosis of aorta: Secondary | ICD-10-CM

## 2021-02-12 DIAGNOSIS — M1990 Unspecified osteoarthritis, unspecified site: Secondary | ICD-10-CM

## 2021-02-12 DIAGNOSIS — J302 Other seasonal allergic rhinitis: Secondary | ICD-10-CM

## 2021-02-12 MED ORDER — MELOXICAM 15 MG PO TABS: 15.0000 mg | ORAL_TABLET | Freq: Every day | ORAL | 0 refills | Status: DC

## 2021-02-12 MED ORDER — PRAVASTATIN SODIUM 40 MG PO TABS: 40.0000 mg | ORAL_TABLET | Freq: Every day | ORAL | 0 refills | Status: DC

## 2021-02-12 MED ORDER — PANTOPRAZOLE SODIUM 40 MG PO TBEC: 40.0000 mg | DELAYED_RELEASE_TABLET | Freq: Every day | ORAL | 0 refills | Status: DC

## 2021-02-12 MED ORDER — LEVOCETIRIZINE DIHYDROCHLORIDE 5 MG PO TABS: 5.0000 mg | ORAL_TABLET | Freq: Every evening | ORAL | 0 refills | Status: DC

## 2021-02-12 NOTE — Addendum Note (Signed)
Addended by: Antonietta Barcelona D on: 02/12/2021 04:16 PM   Modules accepted: Orders

## 2021-02-27 ENCOUNTER — Other Ambulatory Visit: Payer: Self-pay | Admitting: Family Medicine

## 2021-02-27 DIAGNOSIS — J302 Other seasonal allergic rhinitis: Secondary | ICD-10-CM

## 2021-02-27 DIAGNOSIS — I700 Atherosclerosis of aorta: Secondary | ICD-10-CM

## 2021-02-27 DIAGNOSIS — E785 Hyperlipidemia, unspecified: Secondary | ICD-10-CM

## 2021-02-27 DIAGNOSIS — L219 Seborrheic dermatitis, unspecified: Secondary | ICD-10-CM

## 2021-02-28 ENCOUNTER — Ambulatory Visit: Payer: Medicare HMO

## 2021-03-09 ENCOUNTER — Encounter: Payer: Self-pay | Admitting: Family Medicine

## 2021-03-09 ENCOUNTER — Ambulatory Visit (INDEPENDENT_AMBULATORY_CARE_PROVIDER_SITE_OTHER): Payer: Medicare HMO | Admitting: Family Medicine

## 2021-03-09 ENCOUNTER — Other Ambulatory Visit: Payer: Self-pay

## 2021-03-09 VITALS — BP 137/84 | HR 75 | Temp 98.0°F | Ht 69.0 in | Wt 213.4 lb

## 2021-03-09 DIAGNOSIS — E785 Hyperlipidemia, unspecified: Secondary | ICD-10-CM

## 2021-03-09 DIAGNOSIS — G8929 Other chronic pain: Secondary | ICD-10-CM | POA: Diagnosis not present

## 2021-03-09 DIAGNOSIS — M25512 Pain in left shoulder: Secondary | ICD-10-CM | POA: Diagnosis not present

## 2021-03-09 DIAGNOSIS — G5603 Carpal tunnel syndrome, bilateral upper limbs: Secondary | ICD-10-CM | POA: Diagnosis not present

## 2021-03-09 DIAGNOSIS — I700 Atherosclerosis of aorta: Secondary | ICD-10-CM | POA: Diagnosis not present

## 2021-03-09 DIAGNOSIS — Z91030 Bee allergy status: Secondary | ICD-10-CM

## 2021-03-09 DIAGNOSIS — R252 Cramp and spasm: Secondary | ICD-10-CM

## 2021-03-09 MED ORDER — GABAPENTIN 300 MG PO CAPS: 300.0000 mg | ORAL_CAPSULE | Freq: Every day | ORAL | 2 refills | Status: DC

## 2021-03-09 MED ORDER — CYCLOBENZAPRINE HCL 10 MG PO TABS: 10.0000 mg | ORAL_TABLET | Freq: Every day | ORAL | 2 refills | Status: DC | PRN

## 2021-03-09 MED ORDER — EPINEPHRINE 0.3 MG/0.3ML IJ SOAJ: 0.3000 mg | INTRAMUSCULAR | 1 refills | Status: DC | PRN

## 2021-03-09 NOTE — Progress Notes (Signed)
Assessment & Plan:  1-2. Hyperlipidemia LDL goal <100/Aortic atherosclerosis (HCC) Continue current statin.  Labs today to reassess.  Encouraged to add co-Q10 to help with joint pains related to statin. - Lipid panel - CMP14+EGFR  3. Leg cramps Encourage patient to stay well-hydrated throughout the day.  Started gabapentin 300 mg at bedtime to see if this helps with the cramping. - cyclobenzaprine (FLEXERIL) 10 MG tablet; Take 1 tablet (10 mg total) by mouth daily as needed for muscle spasms.  Dispense: 30 tablet; Refill: 2 - gabapentin (NEURONTIN) 300 MG capsule; Take 1 capsule (300 mg total) by mouth at bedtime.  Dispense: 30 capsule; Refill: 2  4. Bilateral carpal tunnel syndrome Controlled with wrist splints.  5. Chronic left shoulder pain Managed by orthopedic.  6. Bee sting allergy Prescribed EpiPen. - EPINEPHrine 0.3 mg/0.3 mL IJ SOAJ injection; Inject 0.3 mg into the muscle as needed for anaphylaxis.  Dispense: 2 each; Refill: 1   Return in about 4 weeks (around 04/06/2021) for cramping (may be telephone); 6 months CPE.  Hendricks Limes, MSN, APRN, FNP-C [facility]  Subjective:    [name], male    DOB: 1956-04-29, 65 y.o.   MRN: [id number]  Patient Care Team: Loman Brooklyn, FNP as PCP - General (Family Medicine) Gala Romney Cristopher Estimable, MD as Consulting Physician (Gastroenterology)   Chief Complaint:  Chief Complaint  Patient presents with  . Hypertension    2 month follow up   . Joint Pain    Patient states he is still having joint pain since being on the cholesterol medication     HPI: James Davenport is a 65 y.o. male presenting on 03/09/2021 for Hypertension (2 month follow up ) and Joint Pain (Patient states he is still having joint pain since being on the cholesterol medication )  Hyperlipidemia/aortic atherosclerosis: Patient previously failed treatment with atorvastatin due to an increase in joint pain.  He was  switched to pravastatin which she reports is much better, although he still has some joint pain.  Leg cramping: Patient reports he continues to have leg cramping, mostly at night.  When this happens he takes a fourth of a Flexeril and Midol which is typically effective.  Carpal tunnel: Significantly improved with wrist splints at night.  Chronic left shoulder pain: Patient did see orthopedics.  He states they told him if his pain worsens to let them know and they will give him an intra-articular joint injection under ultrasound.  New complaints: Patient is requesting an EpiPen due to an allergic reaction to [place] hornets.  States he got stung by 1 and his throat immediately swelled shut.  States of his neighbor had not stuck him with an EpiPen he would be dead.  Social history:  Relevant past medical, surgical, family and social history reviewed and updated as indicated. Interim medical history since our last visit reviewed.  Allergies and medications reviewed and updated.  DATA REVIEWED: CHART IN EPIC  ROS: Negative unless specifically indicated above in HPI.    Current Outpatient Medications:  .  acetaminophen (TYLENOL) 500 MG tablet, Take 500 mg by mouth every 6 (six) hours as needed., Disp: , Rfl:  .  aspirin 81 MG chewable tablet, Chew 1 tablet by mouth daily at 2 PM., Disp: , Rfl:  .  cyclobenzaprine (FLEXERIL) 10 MG tablet, Take 1 tablet (10 mg total) by mouth daily as needed for muscle spasms., Disp: 30 tablet, Rfl: 2 .  fluticasone (FLONASE) 50 MCG/ACT  nasal spray, Place 2 sprays into both nostrils daily., Disp: 16 g, Rfl: 6 .  ketoconazole (NIZORAL) 2 % cream, Apply 1 application topically daily., Disp: 90 g, Rfl: 0 .  ketoconazole (NIZORAL) 2 % shampoo, Apply 1 application topically 2 (two) times a week. Leave on for 3-5 minutes before rinsing., Disp: 120 mL, Rfl: 0 .  levocetirizine (XYZAL) 5 MG tablet, Take 1 tablet (5 mg total) by mouth every evening., Disp: 90 tablet,  Rfl: 0 .  meloxicam (MOBIC) 15 MG tablet, Take 1 tablet (15 mg total) by mouth daily., Disp: 90 tablet, Rfl: 0 .  pantoprazole (PROTONIX) 40 MG tablet, Take 1 tablet (40 mg total) by mouth daily., Disp: 90 tablet, Rfl: 0 .  pravastatin (PRAVACHOL) 40 MG tablet, Take 1 tablet (40 mg total) by mouth daily., Disp: 90 tablet, Rfl: 0   Allergies  Allergen Reactions  . Atorvastatin Other (See Comments)    Joint pains  . Tramadol Nausea And Vomiting   Past Medical History:  Diagnosis Date  . Aortic atherosclerosis ([place])   . Arthritis 11/24/2020  . Bilateral carpal tunnel syndrome 11/24/2020  . Chronic left shoulder pain 11/24/2020  . GERD (gastroesophageal reflux disease) 11/24/2020  . Hyperlipidemia LDL goal <100 01/31/2016  . Hypertension   . Leg cramps 11/24/2020    Past Surgical History:  Procedure Laterality Date  . LEG SURGERY Right 1975    Social History   Socioeconomic History  . Marital status: Single    Spouse name: Not on file  . Number of children: Not on file  . Years of education: Not on file  . Highest education level: Not on file  Occupational History  . Not on file  Tobacco Use  . Smoking status: Former Smoker    Types: Cigarettes    Quit date: 1990    Years since quitting: 32.4  . Smokeless tobacco: Never Used  Vaping Use  . Vaping Use: Never used  Substance and Sexual Activity  . Alcohol use: Yes    Comment: occ  . Drug use: Never  . Sexual activity: Not on file  Other Topics Concern  . Not on file  Social History Narrative  . Not on file   Social Determinants of Health   Financial Resource Strain: Not on file  Food Insecurity: Not on file  Transportation Needs: Not on file  Physical Activity: Not on file  Stress: Not on file  Social Connections: Not on file  Intimate Partner Violence: Not on file        Objective:    BP 137/84   Pulse 75   Temp 98 F (36.7 C) (Temporal)   Ht _0  (1.753 m)   Wt 213 lb 6.4 oz (96.8 kg)   SpO2 93%   BMI  31.51 kg/m   Wt Readings from Last 3 Encounters:  03/09/21 213 lb 6.4 oz (96.8 kg)  01/09/21 214 lb 9.6 oz (97.3 kg)  12/05/20 210 lb (95.3 kg)    Physical Exam Vitals reviewed.  Constitutional:      General: He is not in acute distress.    Appearance: Normal appearance. He is obese. He is not ill-appearing, toxic-appearing or diaphoretic.  HENT:     Head: Normocephalic and atraumatic.  Eyes:     General: No scleral icterus.       Right eye: No discharge.        Left eye: No discharge.     Conjunctiva/sclera: Conjunctivae normal.  Cardiovascular:  Rate and Rhythm: Normal rate and regular rhythm.     Heart sounds: Normal heart sounds. No murmur heard. No friction rub. No gallop.   Pulmonary:     Effort: Pulmonary effort is normal. No respiratory distress.     Breath sounds: Normal breath sounds. No stridor. No wheezing, rhonchi or rales.  Musculoskeletal:        General: Normal range of motion.     Cervical back: Normal range of motion.  Skin:    General: Skin is warm and dry.  Neurological:     Mental Status: He is alert and oriented to person, place, and time. Mental status is at baseline.  Psychiatric:        Mood and Affect: Mood normal.        Behavior: Behavior normal.        Thought Content: Thought content normal.        Judgment: Judgment normal.     Lab Results  Component Value Date   TSH 1.260 11/29/2020   Lab Results  Component Value Date   WBC 6.7 11/29/2020   HGB 16.4 11/29/2020   HCT 48.7 11/29/2020   MCV 87 11/29/2020   PLT 219 11/29/2020   Lab Results  Component Value Date   NA 142 11/29/2020   K 4.5 11/29/2020   CO2 20 11/29/2020   GLUCOSE 100 (H) 11/29/2020   BUN 20 11/29/2020   CREATININE 0.98 11/29/2020   BILITOT 0.4 11/29/2020   ALKPHOS 81 11/29/2020   AST 27 11/29/2020   ALT 27 11/29/2020   PROT 6.7 11/29/2020   ALBUMIN 4.2 11/29/2020   CALCIUM 8.8 11/29/2020   Lab Results  Component Value Date   CHOL 188 11/29/2020    Lab Results  Component Value Date   HDL 50 11/29/2020   Lab Results  Component Value Date   LDLCALC 119 (H) 11/29/2020   Lab Results  Component Value Date   TRIG 105 11/29/2020   Lab Results  Component Value Date   CHOLHDL 3.8 11/29/2020   No results found for: HGBA1C

## 2021-03-09 NOTE — Patient Instructions (Signed)
Increase hydration during the day to help with leg cramps.  Add CoQ10 to help with joint pains related to pravastatin.

## 2021-03-10 LAB — CMP14+EGFR
ALT: 32 IU/L (ref 0–44)
AST: 26 IU/L (ref 0–40)
Albumin/Globulin Ratio: 1.6 (ref 1.2–2.2)
Albumin: 4.2 g/dL (ref 3.8–4.8)
Alkaline Phosphatase: 92 IU/L (ref 44–121)
BUN/Creatinine Ratio: 19 (ref 10–24)
BUN: 17 mg/dL (ref 8–27)
Bilirubin Total: 0.3 mg/dL (ref 0.0–1.2)
CO2: 19 mmol/L — ABNORMAL LOW (ref 20–29)
Calcium: 9 mg/dL (ref 8.6–10.2)
Chloride: 105 mmol/L (ref 96–106)
Creatinine, Ser: 0.91 mg/dL (ref 0.76–1.27)
Globulin, Total: 2.7 g/dL (ref 1.5–4.5)
Glucose: 128 mg/dL — ABNORMAL HIGH (ref 65–99)
Potassium: 4.2 mmol/L (ref 3.5–5.2)
Sodium: 139 mmol/L (ref 134–144)
Total Protein: 6.9 g/dL (ref 6.0–8.5)
eGFR: 94 mL/min/{1.73_m2} (ref >59)

## 2021-03-10 LAB — LIPID PANEL
Chol/HDL Ratio: 3.5 ratio (ref 0.0–5.0)
Cholesterol, Total: 169 mg/dL (ref 100–199)
HDL: 48 mg/dL (ref >39)
LDL Chol Calc (NIH): 96 mg/dL (ref 0–99)
Triglycerides: 140 mg/dL (ref 0–149)
VLDL Cholesterol Cal: 25 mg/dL (ref 5–40)

## 2021-03-13 ENCOUNTER — Ambulatory Visit: Payer: Medicare HMO | Admitting: Family Medicine

## 2021-03-16 ENCOUNTER — Other Ambulatory Visit: Payer: Self-pay | Admitting: Family Medicine

## 2021-03-16 DIAGNOSIS — E785 Hyperlipidemia, unspecified: Secondary | ICD-10-CM

## 2021-03-16 DIAGNOSIS — I700 Atherosclerosis of aorta: Secondary | ICD-10-CM

## 2021-03-16 DIAGNOSIS — J302 Other seasonal allergic rhinitis: Secondary | ICD-10-CM

## 2021-04-10 ENCOUNTER — Encounter: Payer: Self-pay | Admitting: Family Medicine

## 2021-04-10 ENCOUNTER — Telehealth (INDEPENDENT_AMBULATORY_CARE_PROVIDER_SITE_OTHER): Payer: Medicare HMO | Admitting: Family Medicine

## 2021-04-10 DIAGNOSIS — E785 Hyperlipidemia, unspecified: Secondary | ICD-10-CM | POA: Diagnosis not present

## 2021-04-10 DIAGNOSIS — R252 Cramp and spasm: Secondary | ICD-10-CM | POA: Diagnosis not present

## 2021-04-10 DIAGNOSIS — J302 Other seasonal allergic rhinitis: Secondary | ICD-10-CM | POA: Diagnosis not present

## 2021-04-10 DIAGNOSIS — Z789 Other specified health status: Secondary | ICD-10-CM | POA: Diagnosis not present

## 2021-04-10 DIAGNOSIS — I700 Atherosclerosis of aorta: Secondary | ICD-10-CM

## 2021-04-10 MED ORDER — LOVASTATIN 40 MG PO TABS: 40.0000 mg | ORAL_TABLET | Freq: Every day | ORAL | 1 refills | Status: DC

## 2021-04-10 MED ORDER — FLUTICASONE PROPIONATE 50 MCG/ACT NA SUSP: 2.0000 | Freq: Every day | NASAL | 6 refills | Status: DC

## 2021-04-10 MED ORDER — GABAPENTIN 300 MG PO CAPS: 300.0000 mg | ORAL_CAPSULE | Freq: Every day | ORAL | 1 refills | Status: DC

## 2021-04-10 NOTE — Progress Notes (Signed)
Virtual Visit via Telephone Note  I connected with James Davenport on 04/10/21 at 9:44 AM by telephone and verified that I am speaking with the correct person using two identifiers. James Davenport is currently located at home and nobody is currently with him during this visit. The provider, Loman Brooklyn, FNP is located in their office at time of visit.  I discussed the limitations, risks, security and privacy concerns of performing an evaluation and management service by telephone and the availability of in person appointments. I also discussed with the patient that there may be a patient responsible charge related to this service. The patient expressed understanding and agreed to proceed.  Subjective: PCP: Loman Brooklyn, FNP  Chief Complaint  Patient presents with   Leg Cramps   Patient was given a prescription for gabapentin to take at bedtime to help with leg cramping.  He states it is very helpful and he rests a lot better.  He is not taking it every night, but does take it about 5 times per week.  Patient reports he is still having significant joint pain in his fingers, wrists, and knees.  This originally started when he was started on atorvastatin.  He has not had an improvement with the change to pravastatin.  He has been taking Aleve once daily in the evenings, as well as his meloxicam in the mornings.   ROS: Per HPI  Current Outpatient Medications:    acetaminophen (TYLENOL) 500 MG tablet, Take 500 mg by mouth every 6 (six) hours as needed., Disp: , Rfl:    aspirin 81 MG chewable tablet, Chew 1 tablet by mouth daily at 2 PM., Disp: , Rfl:    cyclobenzaprine (FLEXERIL) 10 MG tablet, Take 1 tablet (10 mg total) by mouth daily as needed for muscle spasms., Disp: 30 tablet, Rfl: 2   EPINEPHrine 0.3 mg/0.3 mL IJ SOAJ injection, Inject 0.3 mg into the muscle as needed for anaphylaxis., Disp: 2 each, Rfl: 1   fluticasone (FLONASE) 50 MCG/ACT nasal spray, Place 2 sprays into  both nostrils daily., Disp: 16 g, Rfl: 6   gabapentin (NEURONTIN) 300 MG capsule, Take 1 capsule (300 mg total) by mouth at bedtime., Disp: 30 capsule, Rfl: 2   ketoconazole (NIZORAL) 2 % cream, Apply 1 application topically daily., Disp: 90 g, Rfl: 0   ketoconazole (NIZORAL) 2 % shampoo, Apply 1 application topically 2 (two) times a week. Leave on for 3-5 minutes before rinsing., Disp: 120 mL, Rfl: 0   levocetirizine (XYZAL) 5 MG tablet, TAKE 1 TABLET EVERY EVENING, Disp: 90 tablet, Rfl: 0   meloxicam (MOBIC) 15 MG tablet, Take 1 tablet (15 mg total) by mouth daily., Disp: 90 tablet, Rfl: 0   pantoprazole (PROTONIX) 40 MG tablet, Take 1 tablet (40 mg total) by mouth daily., Disp: 90 tablet, Rfl: 0   pravastatin (PRAVACHOL) 40 MG tablet, TAKE 1 TABLET EVERY DAY, Disp: 90 tablet, Rfl: 0  Allergies  Allergen Reactions   Atorvastatin Other (See Comments)    Joint pains   Tramadol Nausea And Vomiting   Past Medical History:  Diagnosis Date   Aortic atherosclerosis ([place])    Arthritis 11/24/2020   Bilateral carpal tunnel syndrome 11/24/2020   Chronic left shoulder pain 11/24/2020   GERD (gastroesophageal reflux disease) 11/24/2020   Hyperlipidemia LDL goal <100 01/31/2016   Hypertension    Leg cramps 11/24/2020    Observations/Objective: A&O  No respiratory distress or wheezing audible over the phone Mood, judgement, and thought  processes all WNL   Assessment and Plan: 1. Leg cramps Well controlled on current regimen.  - gabapentin (NEURONTIN) 300 MG capsule; Take 1 capsule (300 mg total) by mouth at bedtime.  Dispense: 90 capsule; Refill: 1  2-4. Aortic atherosclerosis ([facility])/Hyperlipidemia LDL goal <100/Statin intolerance Intolerant of atorvastatin and pravastatin.  Rx'd lovastatin.  Discussed if he still does not tolerate this we will not try any more statins.  Plan to try the Zetia next. - lovastatin (MEVACOR) 40 MG tablet; Take 1 tablet (40 mg total) by mouth at bedtime.  Dispense: 90  tablet; Refill: 1  5. Seasonal allergies Well controlled on current regimen.  - fluticasone (FLONASE) 50 MCG/ACT nasal spray; Place 2 sprays into both nostrils daily.  Dispense: 16 g; Refill: 6   Follow Up Instructions: Return as scheduled.  I discussed the assessment and treatment plan with the patient. The patient was provided an opportunity to ask questions and all were answered. The patient agreed with the plan and demonstrated an understanding of the instructions.   The patient was advised to call back or seek an in-person evaluation if the symptoms worsen or if the condition fails to improve as anticipated.  The above assessment and management plan was discussed with the patient. The patient verbalized understanding of and has agreed to the management plan. Patient is aware to call the clinic if symptoms persist or worsen. Patient is aware when to return to the clinic for a follow-up visit. Patient educated on when it is appropriate to go to the emergency department.   Time call ended: 10:00 AM  I provided 16 minutes of non-face-to-face time during this encounter.  Hendricks Limes, MSN, APRN, FNP-C [facility] 04/10/21

## 2021-04-21 ENCOUNTER — Other Ambulatory Visit: Payer: Self-pay | Admitting: Family Medicine

## 2021-04-21 DIAGNOSIS — K219 Gastro-esophageal reflux disease without esophagitis: Secondary | ICD-10-CM

## 2021-04-21 DIAGNOSIS — M1990 Unspecified osteoarthritis, unspecified site: Secondary | ICD-10-CM

## 2021-05-18 ENCOUNTER — Encounter: Payer: Self-pay | Admitting: *Deleted

## 2021-07-12 ENCOUNTER — Encounter: Payer: Self-pay | Admitting: Family Medicine

## 2021-07-12 ENCOUNTER — Ambulatory Visit (INDEPENDENT_AMBULATORY_CARE_PROVIDER_SITE_OTHER): Payer: Medicare HMO | Admitting: Family Medicine

## 2021-07-12 ENCOUNTER — Other Ambulatory Visit: Payer: Self-pay

## 2021-07-12 VITALS — BP 128/75 | HR 58 | Temp 98.0°F | Ht 69.0 in | Wt 210.2 lb

## 2021-07-12 DIAGNOSIS — Z0000 Encounter for general adult medical examination without abnormal findings: Secondary | ICD-10-CM | POA: Diagnosis not present

## 2021-07-12 NOTE — Progress Notes (Signed)
Subjective:   James Davenport is a 65 y.o. male who presents for a Welcome to Medicare exam.   Review of Systems: WNL  Cardiac Risk Factors include: advanced age (>63men, >57 women);male gender     Objective:    Today's Vitals   07/12/21 0909 07/12/21 0910  BP: (!) 144/93 128/75  Pulse: (!) 58   Temp: 98 F (36.7 C)   TempSrc: Temporal   SpO2: 92%   Weight: 210 lb 3.2 oz (95.3 kg)   Height: 5\' 9"  (1.753 m)    Body mass index is 31.04 kg/m.  Medications Outpatient Encounter Medications as of 07/12/2021  Medication Sig   acetaminophen (TYLENOL) 500 MG tablet Take 500 mg by mouth every 6 (six) hours as needed.   aspirin 81 MG chewable tablet Chew 1 tablet by mouth daily at 2 PM.   cyclobenzaprine (FLEXERIL) 10 MG tablet Take 1 tablet (10 mg total) by mouth daily as needed for muscle spasms.   EPINEPHrine 0.3 mg/0.3 mL IJ SOAJ injection Inject 0.3 mg into the muscle as needed for anaphylaxis.   fluticasone (FLONASE) 50 MCG/ACT nasal spray Place 2 sprays into both nostrils daily.   gabapentin (NEURONTIN) 300 MG capsule Take 1 capsule (300 mg total) by mouth at bedtime.   ketoconazole (NIZORAL) 2 % cream Apply 1 application topically daily.   ketoconazole (NIZORAL) 2 % shampoo Apply 1 application topically 2 (two) times a week. Leave on for 3-5 minutes before rinsing.   levocetirizine (XYZAL) 5 MG tablet TAKE 1 TABLET EVERY EVENING   lovastatin (MEVACOR) 40 MG tablet Take 1 tablet (40 mg total) by mouth at bedtime.   meloxicam (MOBIC) 15 MG tablet TAKE 1 TABLET EVERY DAY   pantoprazole (PROTONIX) 40 MG tablet TAKE 1 TABLET EVERY DAY   No facility-administered encounter medications on file as of 07/12/2021.     History: Past Medical History:  Diagnosis Date   Aortic atherosclerosis ([place])    Arthritis 11/24/2020   Bilateral carpal tunnel syndrome 11/24/2020   Chronic left shoulder pain 11/24/2020   GERD (gastroesophageal reflux disease) 11/24/2020   Hyperlipidemia LDL goal  <100 01/31/2016   Hypertension    Leg cramps 11/24/2020   Past Surgical History:  Procedure Laterality Date   LEG SURGERY Right 1975    Family History  Problem Relation Age of Onset   Lung disease Mother    Hypertension Mother    Asthma Daughter    Social History   Occupational History   Not on file  Tobacco Use   Smoking status: Former    Types: Cigarettes    Quit date: 1990    Years since quitting: 32.7   Smokeless tobacco: Never  Vaping Use   Vaping Use: Never used  Substance and Sexual Activity   Alcohol use: Yes    Comment: occ   Drug use: Never   Sexual activity: Not on file   Tobacco Counseling Counseling given: Not Answered  Immunizations and Health Maintenance Immunization History  Administered Date(s) Administered   Tdap 10/07/2016   There are no preventive care reminders to display for this patient.  Activities of Daily Living In your present state of health, do you have any difficulty performing the following activities: 07/12/2021  Hearing? Y  Comment Difficulty hearing until he yawns and pops his ears.  Vision? N  Difficulty concentrating or making decisions? N  Walking or climbing stairs? N  Dressing or bathing? N  Doing errands, shopping? N  Preparing Food and eating ?  N  Using the Toilet? N  In the past six months, have you accidently leaked urine? N  Do you have problems with loss of bowel control? N  Managing your Medications? N  Managing your Finances? N  Housekeeping or managing your Housekeeping? N  Some recent data might be hidden   Advanced Directives: Does Patient Have a Medical Advance Directive?: No Would patient like information on creating a medical advance directive?: No - Patient declined    Assessment:    This is a routine wellness  examination for this patient .   Vision/Hearing screen No results found.  Established with optometrist, Dr. Marin Comment.  Dietary issues and exercise activities discussed:  Current Exercise  Habits: Home exercise routine, Type of exercise: walking;strength training/weights, Time (Minutes): 10, Frequency (Times/Week): 7, Weekly Exercise (Minutes/Week): 70, Intensity: Moderate   Goals      Client will report no fall or injuries in the next 12 months.        Depression Screen PHQ 2/9 Scores 07/12/2021 03/09/2021 01/09/2021 11/24/2020  PHQ - 2 Score 0 0 0 0  PHQ- 9 Score 0 2 - -     Fall Risk Fall Risk  07/12/2021  Falls in the past year? 1  Number falls in past yr: 0  Injury with Fall? 0  Follow up Falls prevention discussed    Cognitive Function MMSE - Mini Mental State Exam 07/12/2021  Orientation to time 5  Orientation to Place 5  Registration 3  Attention/ Calculation 5  Recall 1  Language- name 2 objects 2  Language- repeat 1  Language- follow 3 step command 3  Language- read & follow direction 1  Write a sentence 1  Copy design 1  Total score 28    EKG Sinus bradycardia; rate of 57.       Patient Care Team: Loman Brooklyn, FNP as PCP - General (Family Medicine) Gala Romney, Cristopher Estimable, MD as Consulting Physician (Gastroenterology)     Plan:    I have personally reviewed and noted the following in the patient's chart:   Medical and social history Use of alcohol, tobacco or illicit drugs  Current medications and supplements Functional ability and status Nutritional status Physical activity Advanced directives List of other physicians Hospitalizations, surgeries, and ER visits in previous 12 months Vitals Screenings to include cognitive, depression, and falls Referrals and appointments  In addition, I have reviewed and discussed with patient certain preventive protocols, quality metrics, and best practice recommendations. A written personalized care plan for preventive services as well as general preventive health recommendations were provided to patient.    Loman Brooklyn, FNP 07/12/2021

## 2021-07-22 ENCOUNTER — Encounter: Payer: Self-pay | Admitting: Family Medicine

## 2021-07-23 ENCOUNTER — Telehealth (INDEPENDENT_AMBULATORY_CARE_PROVIDER_SITE_OTHER): Payer: Medicare HMO | Admitting: Family Medicine

## 2021-07-23 DIAGNOSIS — U071 COVID-19: Secondary | ICD-10-CM

## 2021-07-23 MED ORDER — DEXAMETHASONE 6 MG PO TABS: 6.0000 mg | ORAL_TABLET | Freq: Every day | ORAL | 0 refills | Status: AC

## 2021-07-23 MED ORDER — AZITHROMYCIN 250 MG PO TABS
ORAL_TABLET | ORAL | 0 refills | Status: DC
Start: 2021-07-23 — End: 2021-08-13

## 2021-07-23 MED ORDER — ALBUTEROL SULFATE HFA 108 (90 BASE) MCG/ACT IN AERS: 2.0000 | INHALATION_SPRAY | Freq: Four times a day (QID) | RESPIRATORY_TRACT | 1 refills | Status: DC | PRN

## 2021-07-23 MED ORDER — MOLNUPIRAVIR EUA 200MG CAPSULE
4.0000 | ORAL_CAPSULE | Freq: Two times a day (BID) | ORAL | 0 refills | Status: AC
Start: 2021-07-23 — End: 2021-07-28

## 2021-07-23 NOTE — Progress Notes (Signed)
Virtual Visit via Telephone Note  I connected with James Davenport on 07/23/21 at 1:16 PM by telephone and verified that I am speaking with the correct person using two identifiers. James Davenport is currently located at home and nobody is currently with him during this visit. The provider, Loman Brooklyn, FNP is located in their office at time of visit.  I discussed the limitations, risks, security and privacy concerns of performing an evaluation and management service by telephone and the availability of in person appointments. I also discussed with the patient that there may be a patient responsible charge related to this service. The patient expressed understanding and agreed to proceed.  Subjective: PCP: Loman Brooklyn, FNP  Chief Complaint  Patient presents with   Covid Positive   Patient complains of head/chest congestion, headache, fever, and shortness of breath. Onset of symptoms was 2 days ago, gradually improving since that time. He is drinking plenty of fluids. Evaluation to date: at home COVID test positive. Treatment to date:  Theraflu . He does not smoke.    ROS: Per HPI  Current Outpatient Medications:    acetaminophen (TYLENOL) 500 MG tablet, Take 500 mg by mouth every 6 (six) hours as needed., Disp: , Rfl:    aspirin 81 MG chewable tablet, Chew 1 tablet by mouth daily at 2 PM., Disp: , Rfl:    cyclobenzaprine (FLEXERIL) 10 MG tablet, Take 1 tablet (10 mg total) by mouth daily as needed for muscle spasms., Disp: 30 tablet, Rfl: 2   EPINEPHrine 0.3 mg/0.3 mL IJ SOAJ injection, Inject 0.3 mg into the muscle as needed for anaphylaxis., Disp: 2 each, Rfl: 1   fluticasone (FLONASE) 50 MCG/ACT nasal spray, Place 2 sprays into both nostrils daily., Disp: 16 g, Rfl: 6   gabapentin (NEURONTIN) 300 MG capsule, Take 1 capsule (300 mg total) by mouth at bedtime., Disp: 90 capsule, Rfl: 1   ketoconazole (NIZORAL) 2 % cream, Apply 1 application topically daily., Disp: 90 g,  Rfl: 0   ketoconazole (NIZORAL) 2 % shampoo, Apply 1 application topically 2 (two) times a week. Leave on for 3-5 minutes before rinsing., Disp: 120 mL, Rfl: 0   levocetirizine (XYZAL) 5 MG tablet, TAKE 1 TABLET EVERY EVENING, Disp: 90 tablet, Rfl: 0   lovastatin (MEVACOR) 40 MG tablet, Take 1 tablet (40 mg total) by mouth at bedtime., Disp: 90 tablet, Rfl: 1   meloxicam (MOBIC) 15 MG tablet, TAKE 1 TABLET EVERY DAY, Disp: 90 tablet, Rfl: 0   pantoprazole (PROTONIX) 40 MG tablet, TAKE 1 TABLET EVERY DAY, Disp: 90 tablet, Rfl: 0  Allergies  Allergen Reactions   Atorvastatin Other (See Comments)    Joint pains   Tramadol Nausea And Vomiting   Past Medical History:  Diagnosis Date   Aortic atherosclerosis ([place])    Arthritis 11/24/2020   Bilateral carpal tunnel syndrome 11/24/2020   Chronic left shoulder pain 11/24/2020   GERD (gastroesophageal reflux disease) 11/24/2020   Hyperlipidemia LDL goal <100 01/31/2016   Hypertension    Leg cramps 11/24/2020    Observations/Objective: A&O  No respiratory distress or wheezing audible over the phone Mood, judgement, and thought processes all WNL  Assessment and Plan: 1. COVID-19 Continue symptom management. - molnupiravir EUA (LAGEVRIO) 200 mg CAPS capsule; Take 4 capsules (800 mg total) by mouth 2 (two) times daily for 5 days.  Dispense: 40 capsule; Refill: 0 - dexamethasone (DECADRON) 6 MG tablet; Take 1 tablet (6 mg total) by mouth daily for 5  days.  Dispense: 5 tablet; Refill: 0 - azithromycin (ZITHROMAX Z-PAK) 250 MG tablet; Take 2 tablets (500 mg) PO today, then 1 tablet (250 mg) PO daily x4 days.  Dispense: 6 tablet; Refill: 0 - albuterol (VENTOLIN HFA) 108 (90 Base) MCG/ACT inhaler; Inhale 2 puffs into the lungs every 6 (six) hours as needed.  Dispense: 18 g; Refill: 1   Follow Up Instructions:  I discussed the assessment and treatment plan with the patient. The patient was provided an opportunity to ask questions and all were answered.  The patient agreed with the plan and demonstrated an understanding of the instructions.   The patient was advised to call back or seek an in-person evaluation if the symptoms worsen or if the condition fails to improve as anticipated.  The above assessment and management plan was discussed with the patient. The patient verbalized understanding of and has agreed to the management plan. Patient is aware to call the clinic if symptoms persist or worsen. Patient is aware when to return to the clinic for a follow-up visit. Patient educated on when it is appropriate to go to the emergency department.   Time call ended: 1:27 PM  I provided 11 minutes of non-face-to-face time during this encounter.  Hendricks Limes, MSN, APRN, FNP-C [facility] 07/23/21

## 2021-08-10 ENCOUNTER — Encounter: Payer: Self-pay | Admitting: Family Medicine

## 2021-08-10 ENCOUNTER — Telehealth: Payer: Self-pay

## 2021-08-10 NOTE — Telephone Encounter (Signed)
Spoke with patient and advised him to use inhaler 4 times per day per PCP.  Go to ER if he has SOB or O2 under 90. Patient verbalizes understanding. Scheduled on Monday

## 2021-08-13 ENCOUNTER — Other Ambulatory Visit: Payer: Self-pay

## 2021-08-13 ENCOUNTER — Ambulatory Visit (INDEPENDENT_AMBULATORY_CARE_PROVIDER_SITE_OTHER): Payer: Medicare HMO | Admitting: Nurse Practitioner

## 2021-08-13 ENCOUNTER — Encounter: Payer: Self-pay | Admitting: Nurse Practitioner

## 2021-08-13 VITALS — BP 125/74 | HR 73 | Temp 98.0°F | Ht 69.0 in | Wt 210.1 lb

## 2021-08-13 DIAGNOSIS — R0602 Shortness of breath: Secondary | ICD-10-CM | POA: Insufficient documentation

## 2021-08-13 NOTE — Assessment & Plan Note (Signed)
Patient is in clinic today following up for shortness of breath in the last few days worsened over the weekend.  Patient is reporting better symptoms and on assessment all symptoms of shortness of breath resolved.  Oxygen saturation in clinic this morning 96%.  Pulse 73, blood pressure 125/74.  Patient was recently diagnosed with COVID-19 about a month ago.  I continue to provide education to patient to monitor oxygen saturation at home with a pulse ox meter.  And to follow-up with unresolved symptoms.  Patient verbalized understanding.

## 2021-08-13 NOTE — Progress Notes (Signed)
Acute Office Visit  Subjective:    [name], male    DOB: 1956/04/25, 65 y.o.   MRN: [id number]  Chief Complaint  Patient presents with   Shortness of Breath    Shortness of Breath This is a new problem. The current episode started in the past 7 days. The problem occurs intermittently. The problem has been resolved. Pertinent negatives include no abdominal pain, claudication, leg pain, leg swelling or sore throat. Nothing aggravates the symptoms. Risk factors: recent covid 19 positive. He has tried nothing for the symptoms.     Past Medical History:  Diagnosis Date   Aortic atherosclerosis ([place])    Arthritis 11/24/2020   Bilateral carpal tunnel syndrome 11/24/2020   Chronic left shoulder pain 11/24/2020   GERD (gastroesophageal reflux disease) 11/24/2020   Hyperlipidemia LDL goal <100 01/31/2016   Hypertension    Leg cramps 11/24/2020    Past Surgical History:  Procedure Laterality Date   LEG SURGERY Right 1975    Family History  Problem Relation Age of Onset   Lung disease Mother    Hypertension Mother    Asthma Daughter     Social History   Socioeconomic History   Marital status: Single    Spouse name: Not on file   Number of children: Not on file   Years of education: Not on file   Highest education level: Not on file  Occupational History   Not on file  Tobacco Use   Smoking status: Former    Types: Cigarettes    Quit date: 1990    Years since quitting: 32.8   Smokeless tobacco: Never  Vaping Use   Vaping Use: Never used  Substance and Sexual Activity   Alcohol use: Yes    Comment: occ   Drug use: Never   Sexual activity: Not on file  Other Topics Concern   Not on file  Social History Narrative   Not on file   Social Determinants of Health   Financial Resource Strain: Not on file  Food Insecurity: Not on file  Transportation Needs: Not on file  Physical Activity: Not on file  Stress: Not on file  Social Connections: Not  on file  Intimate Partner Violence: Not on file    Outpatient Medications Prior to Visit  Medication Sig Dispense Refill   acetaminophen (TYLENOL) 500 MG tablet Take 500 mg by mouth every 6 (six) hours as needed.     albuterol (VENTOLIN HFA) 108 (90 Base) MCG/ACT inhaler Inhale 2 puffs into the lungs every 6 (six) hours as needed. 18 g 1   aspirin 81 MG chewable tablet Chew 1 tablet by mouth daily at 2 PM.     cyclobenzaprine (FLEXERIL) 10 MG tablet Take 1 tablet (10 mg total) by mouth daily as needed for muscle spasms. 30 tablet 2   fluticasone (FLONASE) 50 MCG/ACT nasal spray Place 2 sprays into both nostrils daily. 16 g 6   gabapentin (NEURONTIN) 300 MG capsule Take 1 capsule (300 mg total) by mouth at bedtime. 90 capsule 1   ketoconazole (NIZORAL) 2 % cream Apply 1 application topically daily. 90 g 0   ketoconazole (NIZORAL) 2 % shampoo Apply 1 application topically 2 (two) times a week. Leave on for 3-5 minutes before rinsing. 120 mL 0   levocetirizine (XYZAL) 5 MG tablet TAKE 1 TABLET EVERY EVENING 90 tablet 0   lovastatin (MEVACOR) 40 MG tablet Take 1 tablet (40 mg total) by mouth at bedtime. 90 tablet  1   meloxicam (MOBIC) 15 MG tablet TAKE 1 TABLET EVERY DAY 90 tablet 0   pantoprazole (PROTONIX) 40 MG tablet TAKE 1 TABLET EVERY DAY 90 tablet 0   EPINEPHrine 0.3 mg/0.3 mL IJ SOAJ injection Inject 0.3 mg into the muscle as needed for anaphylaxis. (Patient not taking: Reported on 08/13/2021) 2 each 1   azithromycin (ZITHROMAX Z-PAK) 250 MG tablet Take 2 tablets (500 mg) PO today, then 1 tablet (250 mg) PO daily x4 days. 6 tablet 0   No facility-administered medications prior to visit.    Allergies  Allergen Reactions   Atorvastatin Other (See Comments)    Joint pains   Tramadol Nausea And Vomiting    Review of Systems  HENT: Negative.  Negative for sore throat.   Eyes: Negative.   Respiratory:  Positive for shortness of breath.   Cardiovascular:  Negative for claudication and  leg swelling.  Gastrointestinal:  Negative for abdominal pain.  All other systems reviewed and are negative.     Objective:    Physical Exam Vitals reviewed.  Constitutional:      Appearance: He is well-developed.  HENT:     Head: Normocephalic.     Mouth/Throat:     Mouth: Mucous membranes are moist.     Pharynx: Oropharynx is clear.  Eyes:     Pupils: Pupils are equal, round, and reactive to light.  Cardiovascular:     Rate and Rhythm: Normal rate and regular rhythm.     Pulses: Normal pulses.     Heart sounds: Normal heart sounds.  Pulmonary:     Effort: Pulmonary effort is normal.     Breath sounds: Normal breath sounds.  Chest:     Chest wall: No tenderness.  Musculoskeletal:     Cervical back: Normal range of motion.  Skin:    General: Skin is warm.     Findings: No rash.  Neurological:     Mental Status: He is alert and oriented to person, place, and time.  Psychiatric:        Mood and Affect: Mood normal.        Behavior: Behavior normal.    BP 125/74   Pulse 73   Temp 98 F (36.7 C) (Temporal)   Ht 5' 9" (1.753 m)   Wt 210 lb 2 oz (95.3 kg)   SpO2 96%   BMI 31.03 kg/m  Wt Readings from Last 3 Encounters:  08/13/21 210 lb 2 oz (95.3 kg)  07/12/21 210 lb 3.2 oz (95.3 kg)  03/09/21 213 lb 6.4 oz (96.8 kg)    Health Maintenance Due  Topic Date Due   Pneumonia Vaccine 43+ Years old (1 - PCV) Never done    There are no preventive care reminders to display for this patient.   Lab Results  Component Value Date   TSH 1.260 11/29/2020   Lab Results  Component Value Date   WBC 6.7 11/29/2020   HGB 16.4 11/29/2020   HCT 48.7 11/29/2020   MCV 87 11/29/2020   PLT 219 11/29/2020   Lab Results  Component Value Date   NA 139 03/09/2021   K 4.2 03/09/2021   CO2 19 (L) 03/09/2021   GLUCOSE 128 (H) 03/09/2021   BUN 17 03/09/2021   CREATININE 0.91 03/09/2021   BILITOT 0.3 03/09/2021   ALKPHOS 92 03/09/2021   AST 26 03/09/2021   ALT 32  03/09/2021   PROT 6.9 03/09/2021   ALBUMIN 4.2 03/09/2021   CALCIUM 9.0 03/09/2021  EGFR 94 03/09/2021   Lab Results  Component Value Date   CHOL 169 03/09/2021   Lab Results  Component Value Date   HDL 48 03/09/2021   Lab Results  Component Value Date   LDLCALC 96 03/09/2021   Lab Results  Component Value Date   TRIG 140 03/09/2021   Lab Results  Component Value Date   CHOLHDL 3.5 03/09/2021   No results found for: HGBA1C     Assessment & Plan:   Problem List Items Addressed This Visit       Other   SOB (shortness of breath) - Primary    Patient is in clinic today following up for shortness of breath in the last few days worsened over the weekend.  Patient is reporting better symptoms and on assessment all symptoms of shortness of breath resolved.  Oxygen saturation in clinic this morning 96%.  Pulse 73, blood pressure 125/74.  Patient was recently diagnosed with COVID-19 about a month ago.  I continue to provide education to patient to monitor oxygen saturation at home with a pulse ox meter.  And to follow-up with unresolved symptoms.  Patient verbalized understanding.        No orders of the defined types were placed in this encounter.    Ivy Lynn, NP

## 2021-08-13 NOTE — Patient Instructions (Signed)
Shortness of Breath, Adult °Shortness of breath means you have trouble breathing. Shortness of breath could be a sign of a medical problem. °Follow these instructions at home: °Pollution °Do not smoke or use any products that contain nicotine or tobacco. If you need help quitting, ask your doctor. °Avoid things that can make it harder to breathe, such as: °Smoke of all kinds. This includes smoke from campfires or forest fires. Do not smoke or allow others to smoke in your home. °Mold. °Dust. °Air pollution. °Chemical smells. °Things that can give you an allergic reaction (allergens) if you have allergies. °Keep your living space clean. Use products that help remove mold and dust. °General instructions °Watch for any changes in your symptoms. °Take over-the-counter and prescription medicines only as told by your doctor. This includes oxygen therapy and inhaled medicines. °Rest as needed. °Return to your normal activities when your doctor says that it is safe. °Keep all follow-up visits. °Contact a doctor if: °Your condition does not get better as soon as expected. °You have a hard time doing your normal activities, even after you rest. °You have new symptoms. °You cannot walk up stairs. °You cannot exercise the way you normally do. °Get help right away if: °Your shortness of breath gets worse. °You have trouble breathing when you are resting. °You feel light-headed or you faint. °You have a cough that is not helped by medicines. °You cough up blood. °You have pain with breathing. °You have pain in your chest, arms, shoulders, or belly (abdomen). °You have a fever. °These symptoms may be an emergency. Get help right away. Call 911. °Do not wait to see if the symptoms will go away. °Do not drive yourself to the hospital. °Summary °Shortness of breath is when you have trouble breathing enough air. It can be a sign of a medical problem. °Avoid things that make it hard for you to breathe, such as smoking, pollution, mold,  and dust. °Watch for any changes in your symptoms. Contact your doctor if you do not get better or you get worse. °This information is not intended to replace advice given to you by your health care provider. Make sure you discuss any questions you have with your health care provider. °Document Revised: 05/12/2021 Document Reviewed: 05/12/2021 °Elsevier Patient Education © 2022 [employer] ° °

## 2021-08-31 ENCOUNTER — Other Ambulatory Visit: Payer: Self-pay | Admitting: Family Medicine

## 2021-08-31 DIAGNOSIS — J302 Other seasonal allergic rhinitis: Secondary | ICD-10-CM

## 2021-08-31 DIAGNOSIS — I700 Atherosclerosis of aorta: Secondary | ICD-10-CM

## 2021-08-31 DIAGNOSIS — E785 Hyperlipidemia, unspecified: Secondary | ICD-10-CM

## 2021-09-11 ENCOUNTER — Encounter: Payer: Medicare HMO | Admitting: Family Medicine

## 2021-09-12 ENCOUNTER — Ambulatory Visit (INDEPENDENT_AMBULATORY_CARE_PROVIDER_SITE_OTHER): Payer: Medicare HMO

## 2021-09-12 ENCOUNTER — Ambulatory Visit (INDEPENDENT_AMBULATORY_CARE_PROVIDER_SITE_OTHER): Payer: Medicare HMO | Admitting: Family Medicine

## 2021-09-12 ENCOUNTER — Encounter: Payer: Self-pay | Admitting: Family Medicine

## 2021-09-12 VITALS — BP 133/76 | HR 83 | Temp 98.1°F | Ht 69.0 in | Wt 212.6 lb

## 2021-09-12 DIAGNOSIS — Z0001 Encounter for general adult medical examination with abnormal findings: Secondary | ICD-10-CM

## 2021-09-12 DIAGNOSIS — M1990 Unspecified osteoarthritis, unspecified site: Secondary | ICD-10-CM | POA: Diagnosis not present

## 2021-09-12 DIAGNOSIS — R0602 Shortness of breath: Secondary | ICD-10-CM

## 2021-09-12 DIAGNOSIS — Z0000 Encounter for general adult medical examination without abnormal findings: Secondary | ICD-10-CM

## 2021-09-12 DIAGNOSIS — Z860100 Personal history of colon polyps, unspecified: Secondary | ICD-10-CM

## 2021-09-12 DIAGNOSIS — L989 Disorder of the skin and subcutaneous tissue, unspecified: Secondary | ICD-10-CM

## 2021-09-12 DIAGNOSIS — R252 Cramp and spasm: Secondary | ICD-10-CM | POA: Diagnosis not present

## 2021-09-12 DIAGNOSIS — K219 Gastro-esophageal reflux disease without esophagitis: Secondary | ICD-10-CM

## 2021-09-12 DIAGNOSIS — J302 Other seasonal allergic rhinitis: Secondary | ICD-10-CM

## 2021-09-12 DIAGNOSIS — Z136 Encounter for screening for cardiovascular disorders: Secondary | ICD-10-CM

## 2021-09-12 DIAGNOSIS — E785 Hyperlipidemia, unspecified: Secondary | ICD-10-CM

## 2021-09-12 DIAGNOSIS — I700 Atherosclerosis of aorta: Secondary | ICD-10-CM

## 2021-09-12 LAB — CBC WITH DIFFERENTIAL/PLATELET
Basophils Absolute: 0.1 10*3/uL (ref 0.0–0.2)
Basos: 1 %
EOS (ABSOLUTE): 0.1 10*3/uL (ref 0.0–0.4)
Eos: 2 %
Hematocrit: 48.7 % (ref 37.5–51.0)
Hemoglobin: 16.6 g/dL (ref 13.0–17.7)
Immature Grans (Abs): 0 10*3/uL (ref 0.0–0.1)
Immature Granulocytes: 1 %
Lymphocytes Absolute: 2.2 10*3/uL (ref 0.7–3.1)
Lymphs: 33 %
MCH: 29 pg (ref 26.6–33.0)
MCHC: 34.1 g/dL (ref 31.5–35.7)
MCV: 85 fL (ref 79–97)
Monocytes Absolute: 0.7 10*3/uL (ref 0.1–0.9)
Monocytes: 10 %
Neutrophils Absolute: 3.6 10*3/uL (ref 1.4–7.0)
Neutrophils: 53 %
Platelets: 207 10*3/uL (ref 150–450)
RBC: 5.73 x10E6/uL (ref 4.14–5.80)
RDW: 12.8 % (ref 11.6–15.4)
WBC: 6.6 10*3/uL (ref 3.4–10.8)

## 2021-09-12 LAB — CMP14+EGFR
ALT: 24 IU/L (ref 0–44)
AST: 22 IU/L (ref 0–40)
Albumin/Globulin Ratio: 1.5 (ref 1.2–2.2)
Albumin: 4.3 g/dL (ref 3.8–4.8)
Alkaline Phosphatase: 81 IU/L (ref 44–121)
BUN/Creatinine Ratio: 18 (ref 10–24)
BUN: 18 mg/dL (ref 8–27)
Bilirubin Total: 0.2 mg/dL (ref 0.0–1.2)
CO2: 20 mmol/L (ref 20–29)
Calcium: 9 mg/dL (ref 8.6–10.2)
Chloride: 104 mmol/L (ref 96–106)
Creatinine, Ser: 1 mg/dL (ref 0.76–1.27)
Globulin, Total: 2.8 g/dL (ref 1.5–4.5)
Glucose: 98 mg/dL (ref 70–99)
Potassium: 4.5 mmol/L (ref 3.5–5.2)
Sodium: 137 mmol/L (ref 134–144)
Total Protein: 7.1 g/dL (ref 6.0–8.5)
eGFR: 83 mL/min/{1.73_m2} (ref >59)

## 2021-09-12 LAB — LIPID PANEL
Chol/HDL Ratio: 4 ratio (ref 0.0–5.0)
Cholesterol, Total: 197 mg/dL (ref 100–199)
HDL: 49 mg/dL (ref >39)
LDL Chol Calc (NIH): 126 mg/dL — ABNORMAL HIGH (ref 0–99)
Triglycerides: 122 mg/dL (ref 0–149)
VLDL Cholesterol Cal: 22 mg/dL (ref 5–40)

## 2021-09-12 MED ORDER — LEVOCETIRIZINE DIHYDROCHLORIDE 5 MG PO TABS: 5.0000 mg | ORAL_TABLET | Freq: Every evening | ORAL | 3 refills | Status: DC

## 2021-09-12 MED ORDER — PANTOPRAZOLE SODIUM 40 MG PO TBEC: 40.0000 mg | DELAYED_RELEASE_TABLET | Freq: Every day | ORAL | 3 refills | Status: DC

## 2021-09-12 MED ORDER — CYCLOBENZAPRINE HCL 10 MG PO TABS: 10.0000 mg | ORAL_TABLET | Freq: Every day | ORAL | 2 refills | Status: DC | PRN

## 2021-09-12 MED ORDER — MELOXICAM 15 MG PO TABS: 15.0000 mg | ORAL_TABLET | Freq: Every day | ORAL | 3 refills | Status: DC

## 2021-09-12 MED ORDER — LOVASTATIN 40 MG PO TABS: 40.0000 mg | ORAL_TABLET | ORAL | 1 refills | Status: DC

## 2021-09-12 MED ORDER — GABAPENTIN 300 MG PO CAPS: 300.0000 mg | ORAL_CAPSULE | Freq: Two times a day (BID) | ORAL | 1 refills | Status: DC

## 2021-09-12 MED ORDER — ALBUTEROL SULFATE HFA 108 (90 BASE) MCG/ACT IN AERS: 2.0000 | INHALATION_SPRAY | Freq: Four times a day (QID) | RESPIRATORY_TRACT | 2 refills | Status: DC | PRN

## 2021-09-12 NOTE — Progress Notes (Signed)
Assessment & Plan:  1. Well adult exam - printed wellness information provided - encouraged healthy diet and lifestyle changes - CBC with Differential/Platelet - CMP14+EGFR - Lipid panel  2. Hyperlipidemia LDL goal <100 - continue statin - encouraged healthy diet and exercise - lovastatin (MEVACOR) 40 MG tablet; Take 1 tablet (40 mg total) by mouth every other day.  Dispense: 45 tablet; Refill: 1 - CMP14+EGFR - Lipid panel  3. Aortic atherosclerosis ([place]) - continue statin - encouraged healthy diet and exercise - lovastatin (MEVACOR) 40 MG tablet; Take 1 tablet (40 mg total) by mouth every other day.  Dispense: 45 tablet; Refill: 1 - CMP14+EGFR - Lipid panel  4. Leg cramps - improving on gabapentin, will increase from daily to BID - cyclobenzaprine (FLEXERIL) 10 MG tablet; Take 1 tablet (10 mg total) by mouth daily as needed for muscle spasms.  Dispense: 30 tablet; Refill: 2 - gabapentin (NEURONTIN) 300 MG capsule; Take 1 capsule (300 mg total) by mouth 2 (two) times daily.  Dispense: 180 capsule; Refill: 1 - CMP14+EGFR  5. Seasonal allergies - controlled with Xyzal - levocetirizine (XYZAL) 5 MG tablet; Take 1 tablet (5 mg total) by mouth every evening.  Dispense: 90 tablet; Refill: 3  6. Arthritis - controlled with meloxicam - meloxicam (MOBIC) 15 MG tablet; Take 1 tablet (15 mg total) by mouth daily.  Dispense: 90 tablet; Refill: 3 - CMP14+EGFR  7. Gastroesophageal reflux disease, unspecified whether esophagitis present - pantoprazole (PROTONIX) 40 MG tablet; Take 1 tablet (40 mg total) by mouth daily.  Dispense: 90 tablet; Refill: 3 - CMP14+EGFR  8. History of colon polyps - encouraged to schedule colonoscopy, not agreeable at this time. Phone # provided for if he changes his mind  9. SOB (shortness of breath) - increased use of albuterol, will obtain CXR  - albuterol (VENTOLIN HFA) 108 (90 Base) MCG/ACT inhaler; Inhale 2 puffs into the lungs every 6 (six) hours  as needed.  Dispense: 18 g; Refill: 2 - DG Chest 2 View; Future  10. Screening for AAA (abdominal aortic aneurysm) - US AORTA; Future  11. Skin lesion of neck - Referral placed to dermatology   Follow-up: Return in about 4 weeks (around 10/10/2021) for leg cramps (may be telephone).   Lucile Crater, NP Student  I personally was present during the history, physical exam, and medical decision-making activities of this service and have verified that the service and findings are accurately documented in the nurse practitioner student's note.  Hendricks Limes, MSN, APRN, FNP-C [facility]   Subjective:  [name], male    DOB: Jun 21, 1956  Age: 65 y.o. MRN: [id number]  Patient Care Team: Loman Brooklyn, FNP as PCP - General (Family Medicine) Daneil Dolin, MD as Consulting Physician (Gastroenterology)   CC:  Chief Complaint  Patient presents with   Annual Exam    Has bump on left side of his neck that has been there for awhile but now is painful    Shortness of Breath    Patient states that he is better but still has SOB - has to use his inhaler 4 times per day.    Joint Pain    Ongoing but has gotten worse     HPI James Davenport presents for annual physical and follow up for chronic medical conditions.  HLD: has previously failed atorvastatin and pravastatin, was initiated on lovastatin at his last visit. He states he still experiences some leg cramping and joint pain,  but this has been better since starting gabapentin. He is taking the lovastatin every other day and tolerating it.  Leg cramps: he was started on gabapentin at his last visit. He states this is significantly helping his leg cramps.   GERD: controlled with Protonix. He states occasionally he will wake in the night with sharp mid epigastric pain, but he believes it is related to his hernia. He declined surgery to repair.  Chronic allergies: he states he is taking Xyzal  about every 3 days and this is effective.   Heath Maintenance: Occupation: retired, Marital status: long-term relationship, Substance use: none Diet: in general, a healthy diet, Exercise: walking, housework Last eye exam: 2 years ago, Dr. Marin Comment at [facility] exam: never Last colonoscopy: he states his last required anesthesia and he does not want to experience this again. He was noted in 2007 to have an adenoma that could lead to cancer and was recommended to follow up in 5 years.  AAA Screening: never done, agreeable to do. Hepatitis C Screening: completed 2/22 PSA: 2/22 Immunizations: Flu Vaccine: declined Tdap Vaccine: up to date  Shingrix Vaccine: declined  COVID-19 Vaccine: declined Pneumonia Vaccine: declined  Advanced Directives Patient does not have advanced directives including DNR, living will, healthcare power of attorney, financial power of attorney, and MOST form. He does not have a copy in the electronic medical record. He would like information.  DEPRESSION SCREENING PHQ 2/9 Scores 09/12/2021 08/13/2021 07/12/2021 03/09/2021 01/09/2021 11/24/2020  PHQ - 2 Score 0 - 0 0 0 0  PHQ- 9 Score 0 - 0 2 - -  Exception Documentation - Patient refusal - - - -    New complaints: He had COVID about 4-6 weeks ago and has been experiencing intermittent shortness of breath requiring his albuterol up to 4 times per day.   He reports a lump behind his ear that has been present for years. He states it can be painful and is sometimes irritated by shaving. He has not seen anyone for this.    Review of Systems  Constitutional: Negative.  Negative for chills, fever, malaise/fatigue and weight loss.  HENT: Negative.  Negative for congestion, ear discharge, ear pain, hearing loss and sinus pain.   Eyes: Negative.  Negative for blurred vision, pain, discharge and redness.  Respiratory:  Positive for cough and shortness of breath. Negative for sputum production and wheezing.   Cardiovascular:  Negative.  Negative for chest pain, palpitations, orthopnea and leg swelling.  Gastrointestinal:  Positive for heartburn. Negative for abdominal pain, constipation, diarrhea, nausea and vomiting.  Genitourinary: Negative.  Negative for dysuria, frequency and urgency.  Musculoskeletal:  Positive for back pain and joint pain. Negative for falls, myalgias and neck pain.  Skin: Negative.  Negative for itching and rash.  Neurological: Negative.  Negative for dizziness, tremors, weakness and headaches.  Endo/Heme/Allergies: Negative.  Negative for environmental allergies and polydipsia. Does not bruise/bleed easily.  Psychiatric/Behavioral: Negative.  Negative for depression, memory loss and substance abuse. The patient is not nervous/anxious and does not have insomnia.     Current Outpatient Medications:    acetaminophen (TYLENOL) 500 MG tablet, Take 500 mg by mouth every 6 (six) hours as needed., Disp: , Rfl:    aspirin 81 MG chewable tablet, Chew 1 tablet by mouth daily at 2 PM., Disp: , Rfl:    EPINEPHrine 0.3 mg/0.3 mL IJ SOAJ injection, Inject 0.3 mg into the muscle as needed for anaphylaxis., Disp: 2 each, Rfl: 1  fluticasone (FLONASE) 50 MCG/ACT nasal spray, Place 2 sprays into both nostrils daily., Disp: 16 g, Rfl: 6   ketoconazole (NIZORAL) 2 % cream, Apply 1 application topically daily., Disp: 90 g, Rfl: 0   ketoconazole (NIZORAL) 2 % shampoo, Apply 1 application topically 2 (two) times a week. Leave on for 3-5 minutes before rinsing., Disp: 120 mL, Rfl: 0   albuterol (VENTOLIN HFA) 108 (90 Base) MCG/ACT inhaler, Inhale 2 puffs into the lungs every 6 (six) hours as needed., Disp: 18 g, Rfl: 2   cyclobenzaprine (FLEXERIL) 10 MG tablet, Take 1 tablet (10 mg total) by mouth daily as needed for muscle spasms., Disp: 30 tablet, Rfl: 2   gabapentin (NEURONTIN) 300 MG capsule, Take 1 capsule (300 mg total) by mouth 2 (two) times daily., Disp: 180 capsule, Rfl: 1   levocetirizine (XYZAL) 5 MG  tablet, Take 1 tablet (5 mg total) by mouth every evening., Disp: 90 tablet, Rfl: 3   lovastatin (MEVACOR) 40 MG tablet, Take 1 tablet (40 mg total) by mouth every other day., Disp: 45 tablet, Rfl: 1   meloxicam (MOBIC) 15 MG tablet, Take 1 tablet (15 mg total) by mouth daily., Disp: 90 tablet, Rfl: 3   pantoprazole (PROTONIX) 40 MG tablet, Take 1 tablet (40 mg total) by mouth daily., Disp: 90 tablet, Rfl: 3  Allergies  Allergen Reactions   Atorvastatin Other (See Comments)    Joint pains   Tramadol Nausea And Vomiting    Past Medical History:  Diagnosis Date   Aortic atherosclerosis ([place])    Arthritis 11/24/2020   Bilateral carpal tunnel syndrome 11/24/2020   Chronic left shoulder pain 11/24/2020   GERD (gastroesophageal reflux disease) 11/24/2020   Hyperlipidemia LDL goal <100 01/31/2016   Hypertension    Leg cramps 11/24/2020    Past Surgical History:  Procedure Laterality Date   LEG SURGERY Right 1975    Family History  Problem Relation Age of Onset   Lung disease Mother    Hypertension Mother    Asthma Daughter     Social History   Socioeconomic History   Marital status: Single    Spouse name: Not on file   Number of children: Not on file   Years of education: Not on file   Highest education level: Not on file  Occupational History   Not on file  Tobacco Use   Smoking status: Former    Types: Cigarettes    Quit date: 1990    Years since quitting: 32.9   Smokeless tobacco: Never  Vaping Use   Vaping Use: Never used  Substance and Sexual Activity   Alcohol use: Yes    Comment: occ   Drug use: Never   Sexual activity: Not on file  Other Topics Concern   Not on file  Social History Narrative   Not on file   Social Determinants of Health   Financial Resource Strain: Not on file  Food Insecurity: Not on file  Transportation Needs: Not on file  Physical Activity: Not on file  Stress: Not on file  Social Connections: Not on file  Intimate Partner  Violence: Not on file      Objective:    BP 133/76   Pulse 83   Temp 98.1 F (36.7 C) (Temporal)   Ht _0  (1.753 m)   Wt 96.4 kg   SpO2 96%   BMI 31.40 kg/m   Wt Readings from Last 3 Encounters:  09/12/21 212 lb 9.6 oz (96.4 kg)  08/13/21 210 lb 2 oz (95.3 kg)  07/12/21 210 lb 3.2 oz (95.3 kg)    Physical Exam Vitals reviewed.  Constitutional:      General: He is not in acute distress.    Appearance: Normal appearance. He is obese. He is not ill-appearing, toxic-appearing or diaphoretic.  HENT:     Head: Normocephalic and atraumatic.     Right Ear: Tympanic membrane, ear canal and external ear normal. There is no impacted cerumen.     Left Ear: Tympanic membrane, ear canal and external ear normal. There is no impacted cerumen.     Nose: Nose normal. No congestion or rhinorrhea.     Mouth/Throat:     Mouth: Mucous membranes are moist.     Pharynx: Oropharynx is clear. No oropharyngeal exudate or posterior oropharyngeal erythema.  Eyes:     General: No scleral icterus.       Right eye: No discharge.        Left eye: No discharge.     Extraocular Movements: Extraocular movements intact.     Conjunctiva/sclera: Conjunctivae normal.     Pupils: Pupils are equal, round, and reactive to light.  Cardiovascular:     Rate and Rhythm: Normal rate and regular rhythm.     Pulses: Normal pulses.     Heart sounds: Normal heart sounds. No murmur heard.   No friction rub. No gallop.  Pulmonary:     Effort: Pulmonary effort is normal. No respiratory distress.     Breath sounds: No stridor. Decreased breath sounds present. No wheezing, rhonchi or rales.  Abdominal:     General: Bowel sounds are normal. There is no distension.     Palpations: Abdomen is soft. There is no mass.     Tenderness: There is no abdominal tenderness.     Hernia: A hernia (diastasis recti) is present.  Musculoskeletal:        General: Normal range of motion.     Cervical back: Normal range of motion.      Right lower leg: No edema.     Left lower leg: No edema.  Skin:    General: Skin is warm and dry.     Capillary Refill: Capillary refill takes less than 2 seconds.     Comments: Small mobile nodule below left year, moves with skin. Skin scaly, pinker, and shinier than surround skin.  Neurological:     General: No focal deficit present.     Mental Status: He is alert and oriented to person, place, and time. Mental status is at baseline.     Motor: No weakness.  Psychiatric:        Mood and Affect: Mood normal.        Behavior: Behavior normal.        Thought Content: Thought content normal.        Judgment: Judgment normal.    Lab Results  Component Value Date   TSH 1.260 11/29/2020   Lab Results  Component Value Date   WBC 6.7 11/29/2020   HGB 16.4 11/29/2020   HCT 48.7 11/29/2020   MCV 87 11/29/2020   PLT 219 11/29/2020   Lab Results  Component Value Date   NA 139 03/09/2021   K 4.2 03/09/2021   CO2 19 (L) 03/09/2021   GLUCOSE 128 (H) 03/09/2021   BUN 17 03/09/2021   CREATININE 0.91 03/09/2021   BILITOT 0.3 03/09/2021   ALKPHOS 92 03/09/2021   AST 26 03/09/2021   ALT 32 03/09/2021  PROT 6.9 03/09/2021   ALBUMIN 4.2 03/09/2021   CALCIUM 9.0 03/09/2021   EGFR 94 03/09/2021   Lab Results  Component Value Date   CHOL 169 03/09/2021   Lab Results  Component Value Date   HDL 48 03/09/2021   Lab Results  Component Value Date   LDLCALC 96 03/09/2021   Lab Results  Component Value Date   TRIG 140 03/09/2021   Lab Results  Component Value Date   CHOLHDL 3.5 03/09/2021   No results found for: HGBA1C

## 2021-09-12 NOTE — Patient Instructions (Addendum)
Please schedule your colonoscopy [facility] Phone: [phone]

## 2021-09-13 ENCOUNTER — Encounter: Payer: Self-pay | Admitting: Family Medicine

## 2021-09-13 DIAGNOSIS — E785 Hyperlipidemia, unspecified: Secondary | ICD-10-CM

## 2021-09-13 DIAGNOSIS — I700 Atherosclerosis of aorta: Secondary | ICD-10-CM

## 2021-09-13 MED ORDER — EZETIMIBE 10 MG PO TABS: 10.0000 mg | ORAL_TABLET | Freq: Every day | ORAL | 1 refills | Status: DC

## 2021-09-17 MED ORDER — NEXLIZET 180-10 MG PO TABS: 1.0000 | ORAL_TABLET | Freq: Every day | ORAL | 1 refills | Status: DC

## 2021-09-17 NOTE — Addendum Note (Signed)
Addended by: Loman Brooklyn on: 09/17/2021 03:47 PM   Modules accepted: Orders

## 2021-09-18 ENCOUNTER — Other Ambulatory Visit: Payer: Self-pay

## 2021-09-18 ENCOUNTER — Ambulatory Visit (HOSPITAL_COMMUNITY)
Admission: RE | Admit: 2021-09-18 | Discharge: 2021-09-18 | Disposition: A | Discharge status: Home / Self Care | Payer: Medicare HMO | Source: Ambulatory Visit | Attending: Family Medicine | Admitting: Family Medicine

## 2021-09-18 DIAGNOSIS — Z136 Encounter for screening for cardiovascular disorders: Secondary | ICD-10-CM | POA: Diagnosis not present

## 2021-09-18 DIAGNOSIS — I714 Abdominal aortic aneurysm, without rupture, unspecified: Secondary | ICD-10-CM | POA: Diagnosis not present

## 2021-09-18 DIAGNOSIS — E785 Hyperlipidemia, unspecified: Secondary | ICD-10-CM | POA: Diagnosis not present

## 2021-09-19 ENCOUNTER — Encounter: Payer: Self-pay | Admitting: Family Medicine

## 2021-09-19 DIAGNOSIS — I714 Abdominal aortic aneurysm, without rupture, unspecified: Secondary | ICD-10-CM

## 2021-09-19 HISTORY — DX: Abdominal aortic aneurysm, without rupture, unspecified: I71.40

## 2021-09-20 ENCOUNTER — Telehealth: Payer: Self-pay

## 2021-09-20 NOTE — Telephone Encounter (Signed)
Nexlizet 180-10MG  tablets  Key: [id number] - PA Case ID: [id number] - Rx #: [id number]  Approvedtoday PA Case: [id number], Status: Approved, Coverage Starts on: 10/07/2020 12:00:00 AM, Coverage Ends on: 10/06/2022 12:00:00 AM. Questions? Contact [phone].  Pharmacy aware

## 2021-10-22 ENCOUNTER — Encounter: Payer: Self-pay | Admitting: Family Medicine

## 2021-10-23 ENCOUNTER — Telehealth: Payer: Self-pay | Admitting: Family Medicine

## 2021-10-30 ENCOUNTER — Telehealth: Payer: Self-pay | Admitting: Family Medicine

## 2021-10-30 NOTE — Telephone Encounter (Signed)
Called and lmom to schedule AWV

## 2021-11-15 ENCOUNTER — Telehealth: Payer: Self-pay | Admitting: Family Medicine

## 2021-12-04 ENCOUNTER — Telehealth: Payer: Self-pay | Admitting: Family Medicine

## 2022-01-05 ENCOUNTER — Other Ambulatory Visit: Payer: Self-pay | Admitting: Family Medicine

## 2022-01-05 DIAGNOSIS — R252 Cramp and spasm: Secondary | ICD-10-CM

## 2022-01-05 DIAGNOSIS — R0602 Shortness of breath: Secondary | ICD-10-CM

## 2022-01-07 NOTE — Telephone Encounter (Signed)
He needs an appointment if he is going through Albuterol this quickly.  ?

## 2022-01-08 ENCOUNTER — Other Ambulatory Visit: Payer: Self-pay | Admitting: Family Medicine

## 2022-01-08 NOTE — Telephone Encounter (Signed)
Brintey NTBS per Britney on refill response for inhaler on 01/07/22. Refill sent to pharmacy ?

## 2022-01-08 NOTE — Telephone Encounter (Signed)
Appt made on 01-23-2022 with Christy ?

## 2022-01-23 ENCOUNTER — Encounter: Payer: Self-pay | Admitting: Family Medicine

## 2022-01-23 ENCOUNTER — Ambulatory Visit (INDEPENDENT_AMBULATORY_CARE_PROVIDER_SITE_OTHER): Payer: Medicare HMO | Admitting: Family Medicine

## 2022-01-23 VITALS — BP 130/84 | HR 66 | Temp 97.7°F | Ht 69.0 in | Wt 214.8 lb

## 2022-01-23 DIAGNOSIS — K219 Gastro-esophageal reflux disease without esophagitis: Secondary | ICD-10-CM

## 2022-01-23 DIAGNOSIS — R252 Cramp and spasm: Secondary | ICD-10-CM | POA: Diagnosis not present

## 2022-01-23 DIAGNOSIS — R0602 Shortness of breath: Secondary | ICD-10-CM

## 2022-01-23 DIAGNOSIS — L219 Seborrheic dermatitis, unspecified: Secondary | ICD-10-CM

## 2022-01-23 DIAGNOSIS — E785 Hyperlipidemia, unspecified: Secondary | ICD-10-CM | POA: Diagnosis not present

## 2022-01-23 DIAGNOSIS — L255 Unspecified contact dermatitis due to plants, except food: Secondary | ICD-10-CM

## 2022-01-23 DIAGNOSIS — I700 Atherosclerosis of aorta: Secondary | ICD-10-CM

## 2022-01-23 DIAGNOSIS — J302 Other seasonal allergic rhinitis: Secondary | ICD-10-CM

## 2022-01-23 DIAGNOSIS — Z91030 Bee allergy status: Secondary | ICD-10-CM

## 2022-01-23 DIAGNOSIS — M1990 Unspecified osteoarthritis, unspecified site: Secondary | ICD-10-CM | POA: Diagnosis not present

## 2022-01-23 MED ORDER — SPIRIVA RESPIMAT 1.25 MCG/ACT IN AERS: 2.0000 | INHALATION_SPRAY | Freq: Every day | RESPIRATORY_TRACT | 2 refills | Status: DC

## 2022-01-23 MED ORDER — KETOCONAZOLE 2 % EX SHAM: 1.0000 "application " | MEDICATED_SHAMPOO | CUTANEOUS | 0 refills | Status: DC

## 2022-01-23 MED ORDER — PREDNISONE 10 MG (21) PO TBPK: ORAL_TABLET | ORAL | 0 refills | Status: DC

## 2022-01-23 MED ORDER — EPINEPHRINE 0.3 MG/0.3ML IJ SOAJ: 0.3000 mg | INTRAMUSCULAR | 1 refills | Status: DC | PRN

## 2022-01-23 NOTE — Progress Notes (Signed)
? ?Assessment & Plan:  ?1. Aortic atherosclerosis ([place]) ?Unable to tolerate statins. Continue Nexlizet and aspirin.  ?- CMP14+EGFR ?- Lipid panel ? ?2. Hyperlipidemia LDL goal <100 ?Unable to tolerate statins. Continue Nexlizet and aspirin.  ?- CMP14+EGFR ?- Lipid panel ? ?3. Seborrheic dermatitis ?Well controlled on current regimen.  ?- ketoconazole (NIZORAL) 2 % shampoo; Apply 1 application. topically 2 (two) times a week. Leave on for 3-5 minutes before rinsing.  Dispense: 120 mL; Refill: 0 ? ?4. Bee sting allergy ?- EPINEPHrine 0.3 mg/0.3 mL IJ SOAJ injection; Inject 0.3 mg into the muscle as needed for anaphylaxis.  Dispense: 2 each; Refill: 1 ? ?5. Gastroesophageal reflux disease, unspecified whether esophagitis present ?Well controlled on current regimen.  ?- CMP14+EGFR ? ?6. Arthritis ?Well controlled on current regimen.  ?- CMP14+EGFR ? ?7. Leg cramps ?Well controlled on current regimen.  ?- CMP14+EGFR ? ?8. Seasonal allergies ?Well controlled on current regimen.  ? ?9. Shortness of breath ?Offered referral to pulmonology; patient declined at this time. Starting Spiriva daily. Continue Albuterol as needed.  ?- Tiotropium Bromide Monohydrate (SPIRIVA RESPIMAT) 1.25 MCG/ACT AERS; Inhale 2 puffs into the lungs daily.  Dispense: 4 g; Refill: 2 ? ?10. Rhus dermatitis ?- predniSONE (STERAPRED UNI-PAK 21 TAB) 10 MG (21) TBPK tablet; As directed x 6 days  Dispense: 21 tablet; Refill: 0 ? ? ?Return in about 6 weeks (around 03/06/2022) for breathing. ? ?Hendricks Limes, MSN, APRN, FNP-C ?[place] ? ?Subjective:  ? ? [name], male    DOB: 15-Apr-1956, 66 y.o.   MRN: [id number] ? ?Patient Care Team: ?Loman Brooklyn, FNP as PCP - General (Family Medicine) ?Rourk, Cristopher Estimable, MD as Consulting Physician (Gastroenterology)  ? ?Chief Complaint:  ?Chief Complaint  ?Patient presents with  ? Medical Management of Chronic Issues  ? leg cramps  ?  Patient states that he is still having  leg cramps but not as bad.    ? ? ?HPI: ?James Davenport is a 66 y.o. male presenting on 01/23/2022 for Medical Management of Chronic Issues and leg cramps (Patient states that he is still having leg cramps but not as bad.  ) ? ?Hyperlipidemia/aortic atherosclerosis: Patient previously failed treatment with atorvastatin, pravastatin, and lovastatin due to an increase in joint pain.  He is currently tolerating Nexlizet twice weekly which he is tolerating; this was started in December 2022. ? ?The 10-year ASCVD risk score (Arnett DK, et al., 2019) is: 15.1% ?  Values used to calculate the score: ?    Age: 79 years ?    Sex: Male ?    Is Non-Hispanic African American: No ?    Diabetic: No ?    Tobacco smoker: No ?    Systolic Blood Pressure: 332 mmHg ?    Is BP treated: No ?    HDL Cholesterol: 49 mg/dL ?    Total Cholesterol: 197 mg/dL ? ?Leg cramping: he was started on gabapentin which was increased to twice daily at our last visit. He states this is significantly helping his leg cramps. He only has two per month now. ? ?GERD: controlled with Protonix.  ? ?Allergies: taking Xyzal.  ? ?Arthritis: taking meloxicam.  ? ?Patient reports he is using Albuterol twice daily as he feels he can breath so much better when he uses it. He feels like a different person as he is able to do so much with the inhaler. ? ?New complaints: ?Patient is requesting a taper of Prednisone as he  works outside and gets into poison ivy/oak a lot.  ? ? ?Social history: ? ?Relevant past medical, surgical, family and social history reviewed and updated as indicated. Interim medical history since our last visit reviewed. ? ?Allergies and medications reviewed and updated. ? ?DATA REVIEWED: CHART IN EPIC ? ?ROS: Negative unless specifically indicated above in HPI.  ? ? ?Current Outpatient Medications:  ?  acetaminophen (TYLENOL) 500 MG tablet, Take 500 mg by mouth every 6 (six) hours as needed., Disp: , Rfl:  ?  albuterol (VENTOLIN HFA) 108 (90 Base)  MCG/ACT inhaler, INHALE 2 PUFFS INTO THE LUNGS EVERY 6 (SIX) HOURS AS NEEDED., Disp: 18 g, Rfl: 0 ?  aspirin 81 MG chewable tablet, Chew 1 tablet by mouth daily at 2 PM., Disp: , Rfl:  ?  cyclobenzaprine (FLEXERIL) 10 MG tablet, TAKE 1 TABLET DAILY AS NEEDED FOR MUSCLE SPASMS., Disp: 30 tablet, Rfl: 2 ?  EPINEPHrine 0.3 mg/0.3 mL IJ SOAJ injection, Inject 0.3 mg into the muscle as needed for anaphylaxis., Disp: 2 each, Rfl: 1 ?  ezetimibe (ZETIA) 10 MG tablet, Take 1 tablet (10 mg total) by mouth daily., Disp: 90 tablet, Rfl: 1 ?  fluticasone (FLONASE) 50 MCG/ACT nasal spray, Place 2 sprays into both nostrils daily., Disp: 16 g, Rfl: 6 ?  gabapentin (NEURONTIN) 300 MG capsule, TAKE 1 CAPSULE TWICE DAILY, Disp: 180 capsule, Rfl: 1 ?  ketoconazole (NIZORAL) 2 % cream, Apply 1 application topically daily., Disp: 90 g, Rfl: 0 ?  ketoconazole (NIZORAL) 2 % shampoo, Apply 1 application topically 2 (two) times a week. Leave on for 3-5 minutes before rinsing., Disp: 120 mL, Rfl: 0 ?  levocetirizine (XYZAL) 5 MG tablet, Take 1 tablet (5 mg total) by mouth every evening., Disp: 90 tablet, Rfl: 3 ?  meloxicam (MOBIC) 15 MG tablet, Take 1 tablet (15 mg total) by mouth daily., Disp: 90 tablet, Rfl: 3 ?  NEXLIZET 180-10 MG TABS, TAKE 1 TABLET EVERY DAY, Disp: 90 tablet, Rfl: 0 ?  pantoprazole (PROTONIX) 40 MG tablet, Take 1 tablet (40 mg total) by mouth daily., Disp: 90 tablet, Rfl: 3 ?  lovastatin (MEVACOR) 40 MG tablet, Take 1 tablet (40 mg total) by mouth every other day. (Patient not taking: Reported on 01/23/2022), Disp: 45 tablet, Rfl: 1  ? ?Allergies  ?Allergen Reactions  ? Atorvastatin Other (See Comments)  ?  Joint pains  ? Tramadol Nausea And Vomiting  ? ?Past Medical History:  ?Diagnosis Date  ? AAA (abdominal aortic aneurysm) without rupture ([place]) 09/19/2021  ? Aortic atherosclerosis ([place])   ? Arthritis 11/24/2020  ? Bilateral carpal tunnel syndrome 11/24/2020  ? Chronic left shoulder pain 11/24/2020  ? GERD  (gastroesophageal reflux disease) 11/24/2020  ? Hyperlipidemia LDL goal <100 01/31/2016  ? Hypertension   ? Leg cramps 11/24/2020  ?  ?Past Surgical History:  ?Procedure Laterality Date  ? LEG SURGERY Right 1975  ?  ?Social History  ? ?Socioeconomic History  ? Marital status: Single  ?  Spouse name: Not on file  ? Number of children: Not on file  ? Years of education: Not on file  ? Highest education level: Not on file  ?Occupational History  ? Not on file  ?Tobacco Use  ? Smoking status: Former  ?  Types: Cigarettes  ?  Quit date: 34  ?  Years since quitting: 33.3  ? Smokeless tobacco: Never  ?Vaping Use  ? Vaping Use: Never used  ?Substance and Sexual Activity  ? Alcohol use:  Yes  ?  Comment: occ  ? Drug use: Never  ? Sexual activity: Not on file  ?Other Topics Concern  ? Not on file  ?Social History Narrative  ? Not on file  ? ?Social Determinants of Health  ? ?Financial Resource Strain: Not on file  ?Food Insecurity: Not on file  ?Transportation Needs: Not on file  ?Physical Activity: Not on file  ?Stress: Not on file  ?Social Connections: Not on file  ?Intimate Partner Violence: Not on file  ?  ? ?   ?Objective:  ?  ?BP 130/84   Pulse 66   Temp 97.7 ?F (36.5 ?C) (Temporal)   Ht _0  (1.753 m)   Wt 214 lb 12.8 oz (97.4 kg)   SpO2 96%   BMI 31.72 kg/m?  ? ?Wt Readings from Last 3 Encounters:  ?01/23/22 214 lb 12.8 oz (97.4 kg)  ?09/12/21 212 lb 9.6 oz (96.4 kg)  ?08/13/21 210 lb 2 oz (95.3 kg)  ? ? ?Physical Exam ?Vitals reviewed.  ?Constitutional:   ?   General: He is not in acute distress. ?   Appearance: Normal appearance. He is obese. He is not ill-appearing, toxic-appearing or diaphoretic.  ?HENT:  ?   Head: Normocephalic and atraumatic.  ?Eyes:  ?   General: No scleral icterus.    ?   Right eye: No discharge.     ?   Left eye: No discharge.  ?   Conjunctiva/sclera: Conjunctivae normal.  ?Cardiovascular:  ?   Rate and Rhythm: Normal rate and regular rhythm.  ?   Heart sounds: Normal heart sounds. No  murmur heard. ?  No friction rub. No gallop.  ?Pulmonary:  ?   Effort: Pulmonary effort is normal. No respiratory distress.  ?   Breath sounds: Normal breath sounds. No stridor. No wheezing, rhonchi or rales.  ?Mu

## 2022-01-24 LAB — LIPID PANEL
Chol/HDL Ratio: 3.9 ratio (ref 0.0–5.0)
Cholesterol, Total: 176 mg/dL (ref 100–199)
HDL: 45 mg/dL (ref >39)
LDL Chol Calc (NIH): 114 mg/dL — ABNORMAL HIGH (ref 0–99)
Triglycerides: 94 mg/dL (ref 0–149)
VLDL Cholesterol Cal: 17 mg/dL (ref 5–40)

## 2022-01-24 LAB — CMP14+EGFR
ALT: 28 IU/L (ref 0–44)
AST: 24 IU/L (ref 0–40)
Albumin/Globulin Ratio: 1.8 (ref 1.2–2.2)
Albumin: 4.4 g/dL (ref 3.8–4.8)
Alkaline Phosphatase: 73 IU/L (ref 44–121)
BUN/Creatinine Ratio: 17 (ref 10–24)
BUN: 18 mg/dL (ref 8–27)
Bilirubin Total: 0.4 mg/dL (ref 0.0–1.2)
CO2: 20 mmol/L (ref 20–29)
Calcium: 9.2 mg/dL (ref 8.6–10.2)
Chloride: 106 mmol/L (ref 96–106)
Creatinine, Ser: 1.05 mg/dL (ref 0.76–1.27)
Globulin, Total: 2.5 g/dL (ref 1.5–4.5)
Glucose: 93 mg/dL (ref 70–99)
Potassium: 4.6 mmol/L (ref 3.5–5.2)
Sodium: 141 mmol/L (ref 134–144)
Total Protein: 6.9 g/dL (ref 6.0–8.5)
eGFR: 78 mL/min/{1.73_m2} (ref >59)

## 2022-02-11 ENCOUNTER — Other Ambulatory Visit: Payer: Self-pay | Admitting: Family Medicine

## 2022-02-11 DIAGNOSIS — J302 Other seasonal allergic rhinitis: Secondary | ICD-10-CM

## 2022-02-13 ENCOUNTER — Other Ambulatory Visit: Payer: Self-pay | Admitting: Family Medicine

## 2022-02-13 DIAGNOSIS — L219 Seborrheic dermatitis, unspecified: Secondary | ICD-10-CM

## 2022-03-07 ENCOUNTER — Encounter: Payer: Self-pay | Admitting: Family Medicine

## 2022-03-07 ENCOUNTER — Ambulatory Visit (INDEPENDENT_AMBULATORY_CARE_PROVIDER_SITE_OTHER): Payer: Medicare HMO | Admitting: Family Medicine

## 2022-03-07 VITALS — BP 141/87 | HR 69 | Temp 98.2°F | Ht 69.0 in | Wt 213.8 lb

## 2022-03-07 DIAGNOSIS — E785 Hyperlipidemia, unspecified: Secondary | ICD-10-CM

## 2022-03-07 DIAGNOSIS — R0602 Shortness of breath: Secondary | ICD-10-CM

## 2022-03-07 DIAGNOSIS — R252 Cramp and spasm: Secondary | ICD-10-CM

## 2022-03-07 MED ORDER — SPIRIVA RESPIMAT 1.25 MCG/ACT IN AERS: 2.0000 | INHALATION_SPRAY | Freq: Every day | RESPIRATORY_TRACT | 1 refills | Status: DC

## 2022-03-07 NOTE — Progress Notes (Signed)
Assessment & Plan:  1. Shortness of breath Well controlled on current regimen.  - Tiotropium Bromide Monohydrate (SPIRIVA RESPIMAT) 1.25 MCG/ACT AERS; Inhale 2 puffs into the lungs daily.  Dispense: 12 g; Refill: 1  2. Hyperlipidemia LDL goal <100 Patient is going to increase Nexlizet to every other day since he is tolerating every 3 days.  3. Leg cramps Improving with shoe inserts.   Return in about 6 months (around 09/06/2022) for annual physical.  Hendricks Limes, MSN, APRN, FNP-C [facility]  Subjective:    [name], male    DOB: 1955/12/04, 66 y.o.   MRN: [id number]  Patient Care Team: Loman Brooklyn, FNP as PCP - General (Family Medicine) Gala Romney Cristopher Estimable, MD as Consulting Physician (Gastroenterology)   Chief Complaint:  Chief Complaint  Patient presents with   Medical Management of Chronic Issues    HPI: James Davenport is a 66 y.o. male presenting on 03/07/2022 for Medical Management of Chronic Issues  At our last visit patient was started on Spiriva daily due to shortness of breath and requiring albuterol twice daily.  He reports today he is breathing much better. He is now only using his Albuterol no more than twice weekly when he is doing strenuous exercise.   He was previously only taking Nexlizet once weekly.  She has gradually been increasing this and is up to every 3 days, which she is tolerating.  He reports his leg cramps have improved significantly since putting shoe inserts for plantar fasciitis in all of his shoes.  New complaints: None   Social history:  Relevant past medical, surgical, family and social history reviewed and updated as indicated. Interim medical history since our last visit reviewed.  Allergies and medications reviewed and updated.  DATA REVIEWED: CHART IN EPIC  ROS: Negative unless specifically indicated above in HPI.    Current Outpatient Medications:    acetaminophen (TYLENOL)  500 MG tablet, Take 500 mg by mouth every 6 (six) hours as needed., Disp: , Rfl:    albuterol (VENTOLIN HFA) 108 (90 Base) MCG/ACT inhaler, INHALE 2 PUFFS INTO THE LUNGS EVERY 6 (SIX) HOURS AS NEEDED., Disp: 18 g, Rfl: 0   aspirin 81 MG chewable tablet, Chew 1 tablet by mouth daily at 2 PM., Disp: , Rfl:    cyclobenzaprine (FLEXERIL) 10 MG tablet, TAKE 1 TABLET DAILY AS NEEDED FOR MUSCLE SPASMS., Disp: 30 tablet, Rfl: 2   EPINEPHrine 0.3 mg/0.3 mL IJ SOAJ injection, Inject 0.3 mg into the muscle as needed for anaphylaxis., Disp: 2 each, Rfl: 1   fluticasone (FLONASE) 50 MCG/ACT nasal spray, SPRAY 2 SPRAYS INTO EACH NOSTRIL EVERY DAY, Disp: 16 mL, Rfl: 6   gabapentin (NEURONTIN) 300 MG capsule, TAKE 1 CAPSULE TWICE DAILY, Disp: 180 capsule, Rfl: 1   ketoconazole (NIZORAL) 2 % cream, Apply 1 application topically daily., Disp: 90 g, Rfl: 0   ketoconazole (NIZORAL) 2 % shampoo, APPLY 1 APPLICATION TOPICALLY 2 (TWO) TIMES A WEEK. LEAVE ON FOR 3-5 MINUTES BEFORE RINSING., Disp: 120 mL, Rfl: 0   levocetirizine (XYZAL) 5 MG tablet, Take 1 tablet (5 mg total) by mouth every evening., Disp: 90 tablet, Rfl: 3   meloxicam (MOBIC) 15 MG tablet, Take 1 tablet (15 mg total) by mouth daily., Disp: 90 tablet, Rfl: 3   NEXLIZET 180-10 MG TABS, TAKE 1 TABLET EVERY DAY, Disp: 90 tablet, Rfl: 0   pantoprazole (PROTONIX) 40 MG tablet, Take 1 tablet (40 mg total) by mouth daily.,  Disp: 90 tablet, Rfl: 3   Tiotropium Bromide Monohydrate (SPIRIVA RESPIMAT) 1.25 MCG/ACT AERS, Inhale 2 puffs into the lungs daily., Disp: 4 g, Rfl: 2   Allergies  Allergen Reactions   Statins Other (See Comments)    Polyarthralgia   Tramadol Nausea And Vomiting   Past Medical History:  Diagnosis Date   AAA (abdominal aortic aneurysm) without rupture ([facility]) 09/19/2021   Aortic atherosclerosis ([place])    Arthritis 11/24/2020   Bilateral carpal tunnel syndrome 11/24/2020   Chronic left shoulder pain 11/24/2020   GERD (gastroesophageal  reflux disease) 11/24/2020   Hyperlipidemia LDL goal <100 01/31/2016   Hypertension    Leg cramps 11/24/2020    Past Surgical History:  Procedure Laterality Date   LEG SURGERY Right 1975    Social History   Socioeconomic History   Marital status: Single    Spouse name: Not on file   Number of children: Not on file   Years of education: Not on file   Highest education level: Not on file  Occupational History   Not on file  Tobacco Use   Smoking status: Former    Types: Cigarettes    Quit date: 50    Years since quitting: 33.4   Smokeless tobacco: Never  Vaping Use   Vaping Use: Never used  Substance and Sexual Activity   Alcohol use: Yes    Comment: occ   Drug use: Never   Sexual activity: Not on file  Other Topics Concern   Not on file  Social History Narrative   Not on file   Social Determinants of Health   Financial Resource Strain: Not on file  Food Insecurity: Not on file  Transportation Needs: Not on file  Physical Activity: Not on file  Stress: Not on file  Social Connections: Not on file  Intimate Partner Violence: Not on file        Objective:    BP (!) 141/87   Pulse 69   Temp 98.2 F (36.8 C)   Ht _0  (1.753 m)   Wt 213 lb 12.8 oz (97 kg)   SpO2 92%   BMI 31.57 kg/m   Wt Readings from Last 3 Encounters:  03/07/22 213 lb 12.8 oz (97 kg)  01/23/22 214 lb 12.8 oz (97.4 kg)  09/12/21 212 lb 9.6 oz (96.4 kg)    Physical Exam Vitals reviewed.  Constitutional:      General: He is not in acute distress.    Appearance: Normal appearance. He is obese. He is not ill-appearing, toxic-appearing or diaphoretic.  HENT:     Head: Normocephalic and atraumatic.  Eyes:     General: No scleral icterus.       Right eye: No discharge.        Left eye: No discharge.     Conjunctiva/sclera: Conjunctivae normal.  Cardiovascular:     Rate and Rhythm: Normal rate and regular rhythm.     Heart sounds: Normal heart sounds. No murmur heard.   No  friction rub. No gallop.  Pulmonary:     Effort: Pulmonary effort is normal. No respiratory distress.     Breath sounds: Normal breath sounds. No stridor. No wheezing, rhonchi or rales.  Musculoskeletal:        General: Normal range of motion.     Cervical back: Normal range of motion.  Skin:    General: Skin is warm and dry.  Neurological:     Mental Status: He is alert and oriented to  person, place, and time. Mental status is at baseline.  Psychiatric:        Mood and Affect: Mood normal.        Behavior: Behavior normal.        Thought Content: Thought content normal.        Judgment: Judgment normal.    Lab Results  Component Value Date   TSH 1.260 11/29/2020   Lab Results  Component Value Date   WBC 6.6 09/12/2021   HGB 16.6 09/12/2021   HCT 48.7 09/12/2021   MCV 85 09/12/2021   PLT 207 09/12/2021   Lab Results  Component Value Date   NA 141 01/23/2022   K 4.6 01/23/2022   CO2 20 01/23/2022   GLUCOSE 93 01/23/2022   BUN 18 01/23/2022   CREATININE 1.05 01/23/2022   BILITOT 0.4 01/23/2022   ALKPHOS 73 01/23/2022   AST 24 01/23/2022   ALT 28 01/23/2022   PROT 6.9 01/23/2022   ALBUMIN 4.4 01/23/2022   CALCIUM 9.2 01/23/2022   EGFR 78 01/23/2022   Lab Results  Component Value Date   CHOL 176 01/23/2022   Lab Results  Component Value Date   HDL 45 01/23/2022   Lab Results  Component Value Date   LDLCALC 114 (H) 01/23/2022   Lab Results  Component Value Date   TRIG 94 01/23/2022   Lab Results  Component Value Date   CHOLHDL 3.9 01/23/2022   No results found for: HGBA1C

## 2022-03-31 ENCOUNTER — Other Ambulatory Visit: Payer: Self-pay | Admitting: Family Medicine

## 2022-03-31 DIAGNOSIS — L219 Seborrheic dermatitis, unspecified: Secondary | ICD-10-CM

## 2022-04-24 ENCOUNTER — Other Ambulatory Visit: Payer: Self-pay | Admitting: Family Medicine

## 2022-04-24 DIAGNOSIS — J302 Other seasonal allergic rhinitis: Secondary | ICD-10-CM

## 2022-05-08 IMAGING — DX DG CHEST 2V
3 series · 3 of 3 positions shown · non-contrast
Comparison: 09/12/2021

CLINICAL DATA: Shortness of breath

EXAM:
CHEST - 2 VIEW

[chest pa]
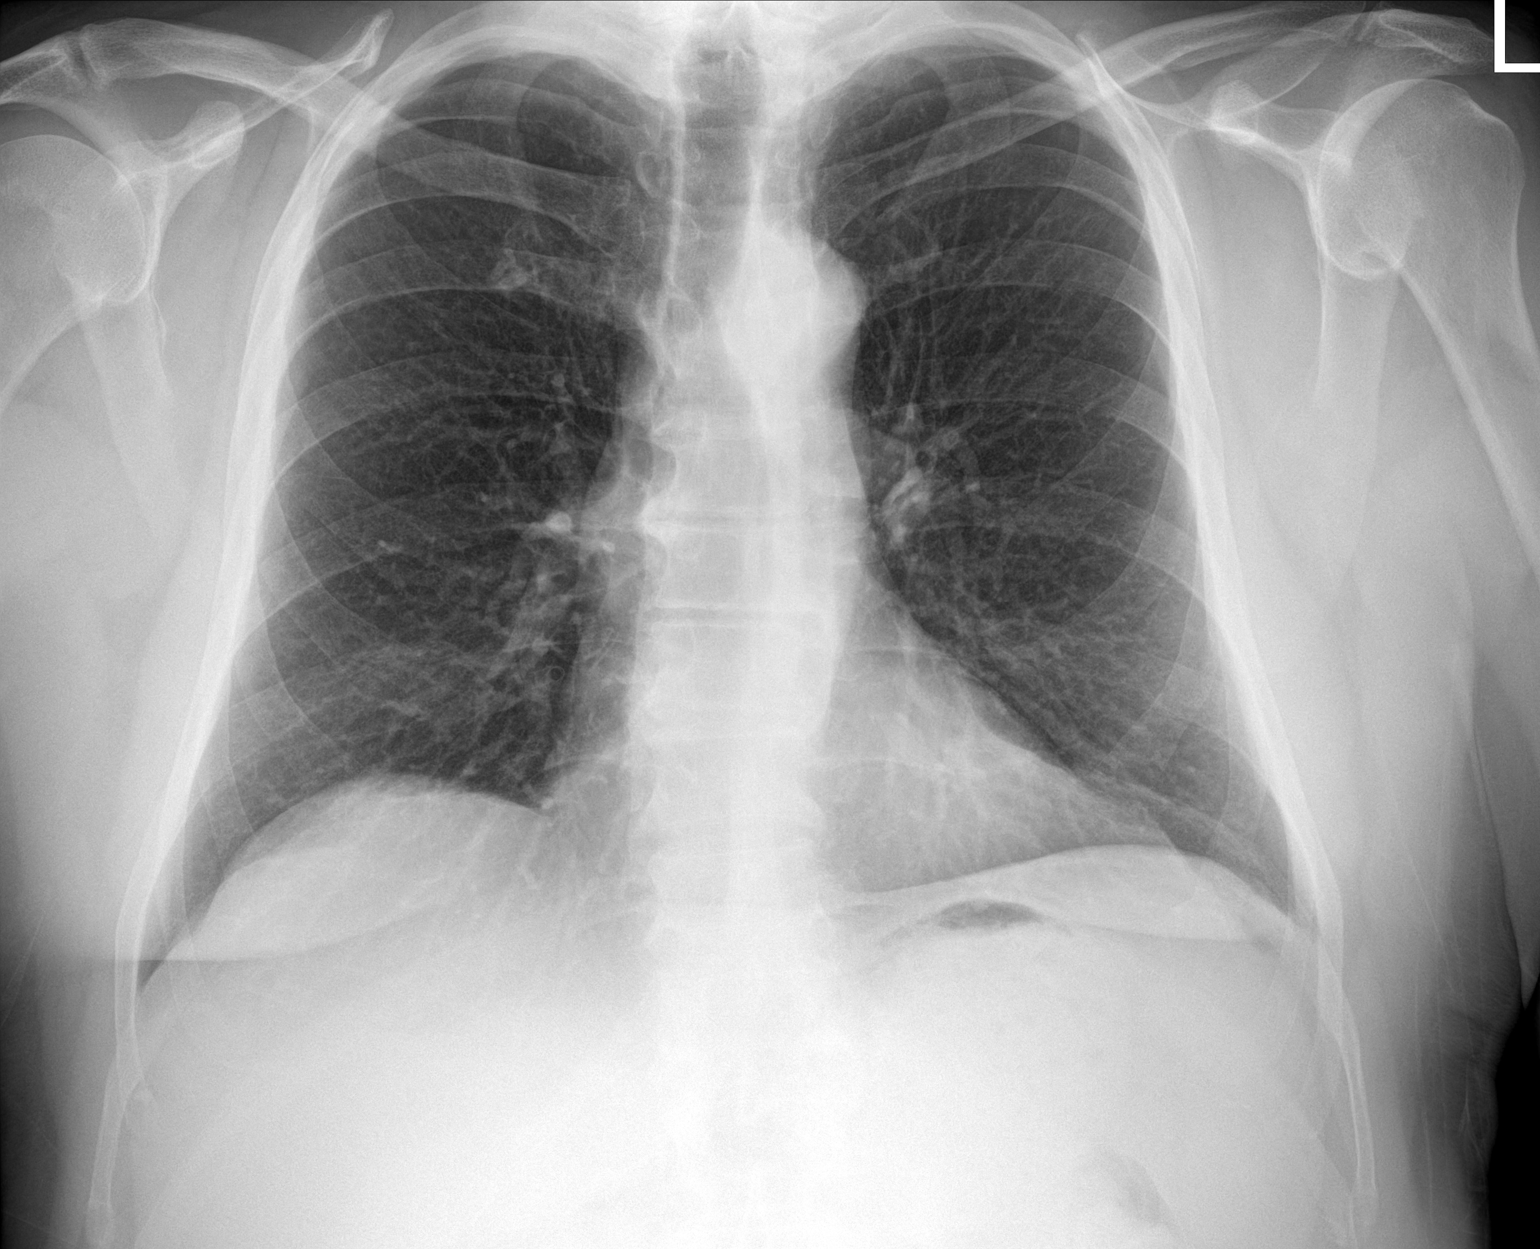

[chest lat (1 of 2)]
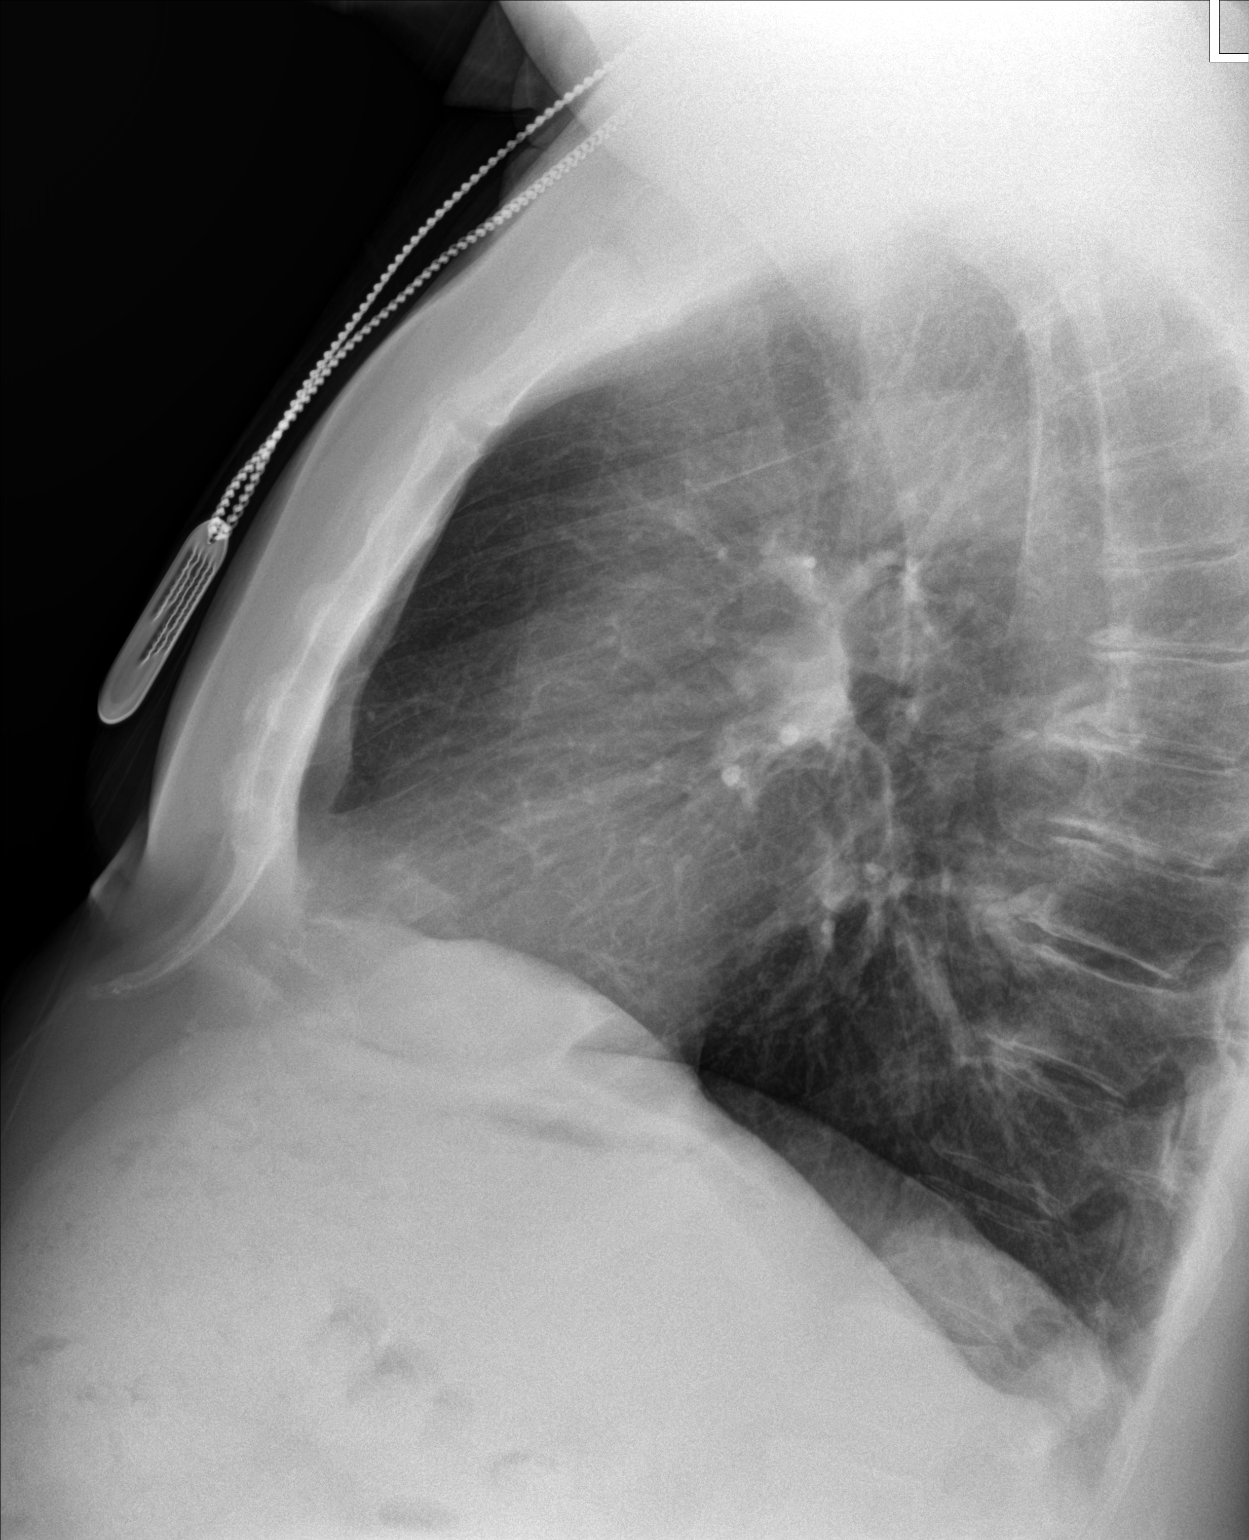

[chest lat (2 of 2)]
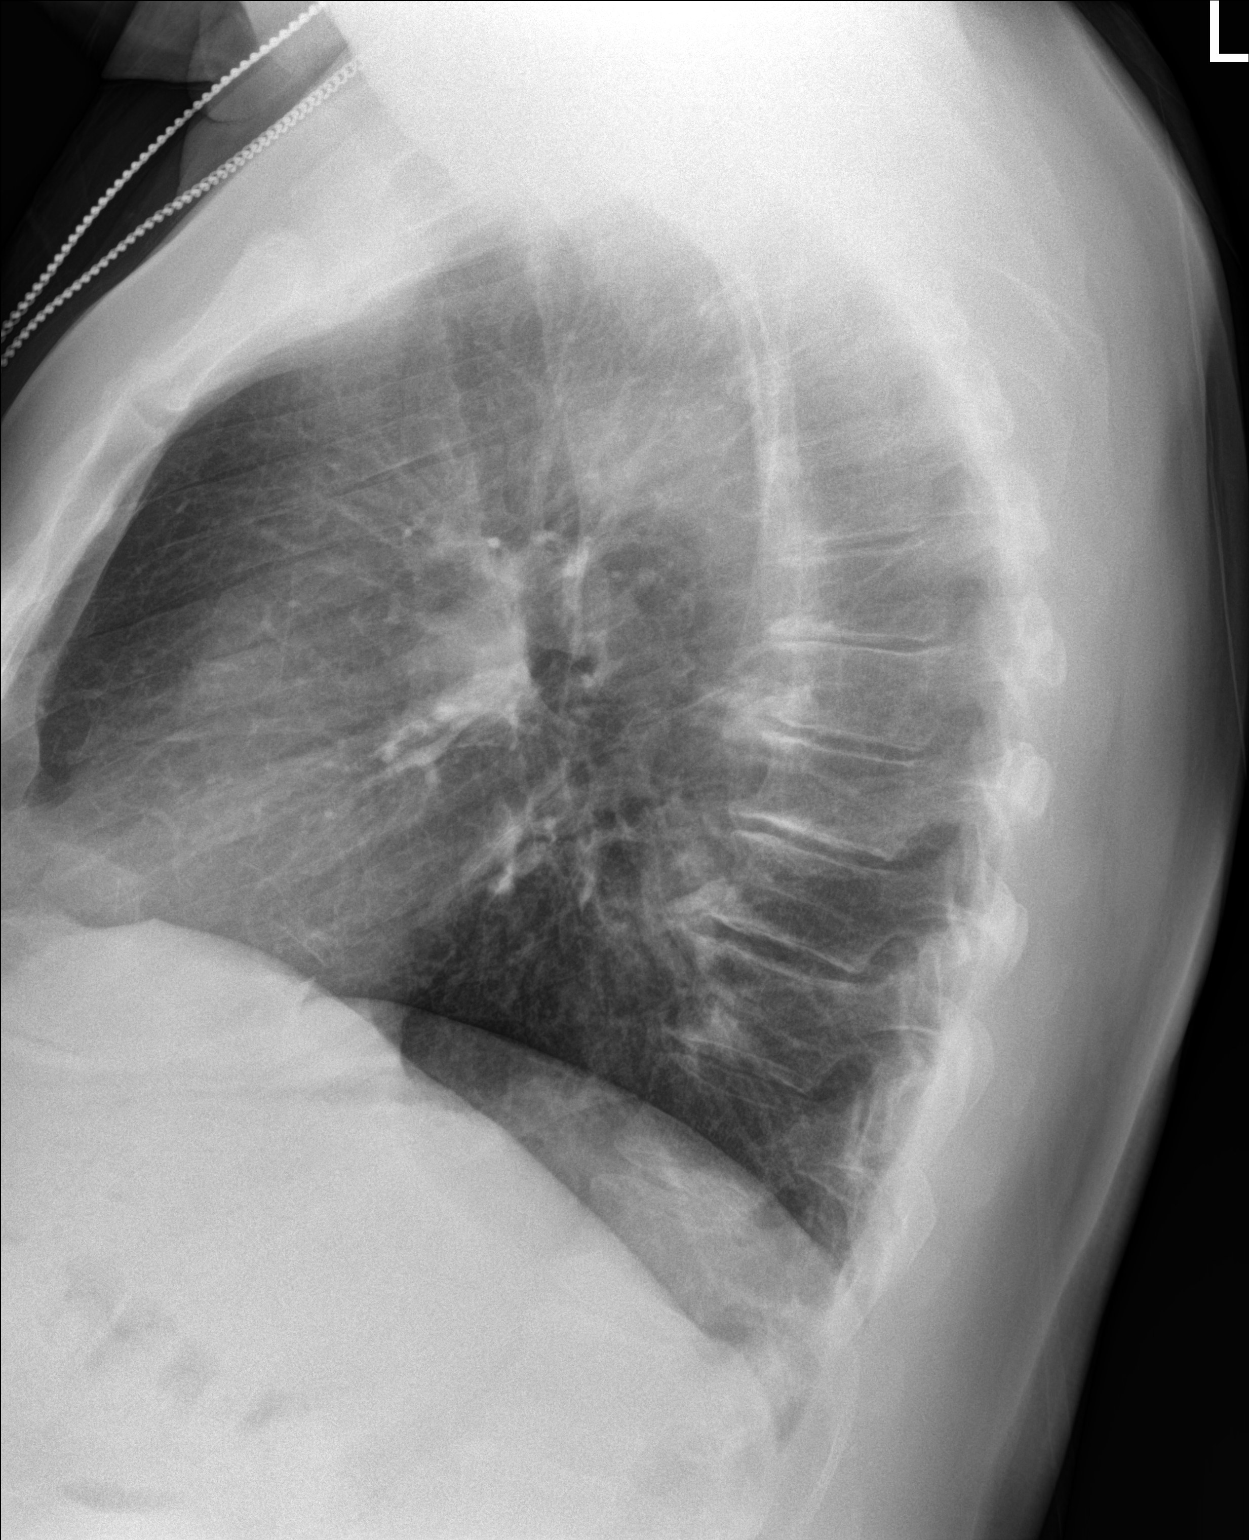

[3 of 3 positions shown; findings below may reference images not displayed]

FINDINGS: Heart size is normal. Mediastinal shadows are normal. The lungs are
clear. No infiltrate, collapse or effusion. Ordinary mild
degenerative changes affect the spine.
IMPRESSION: No active cardiopulmonary disease.

## 2022-05-14 IMAGING — US US AORTA
1 series · 14 of 25 positions shown · non-contrast
Comparison: None.

CLINICAL DATA: History of hypertension, hyperlipidemia and smoking.

EXAM:
ULTRASOUND OF ABDOMINAL AORTA
TECHNIQUE: Ultrasound examination of the abdominal aorta and proximal common
iliac arteries was performed to evaluate for aneurysm. Additional
color and Doppler images of the distal aorta were obtained to
document patency.

[Series 1: us aorta · 14 of 26 slices shown]
[im 1/26]
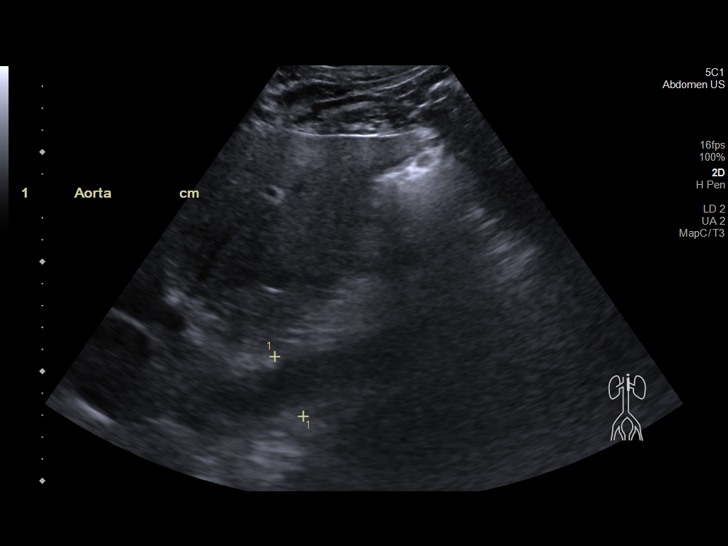
[im 3/26]
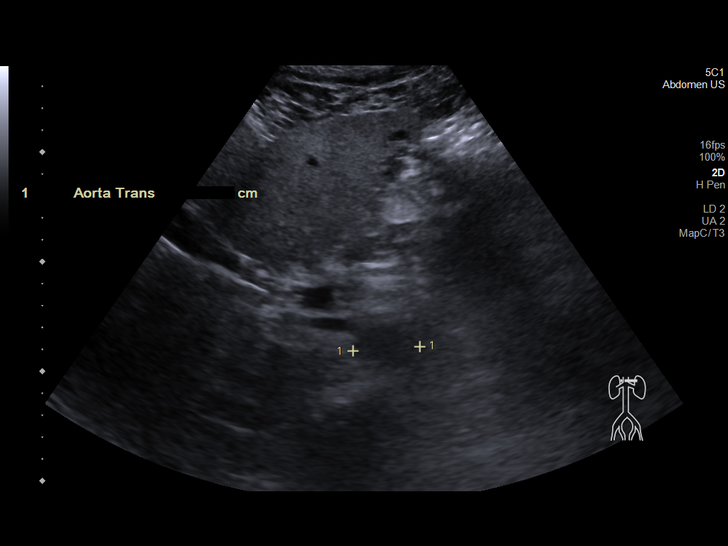
[im 5/26]
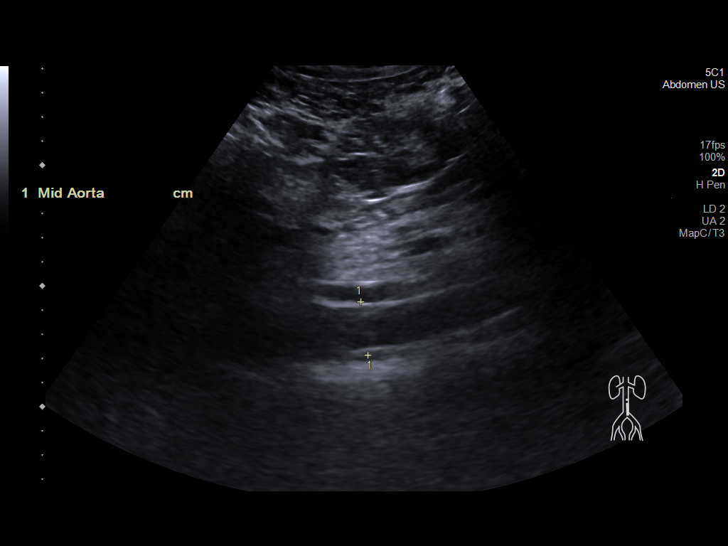
[im 7/26]
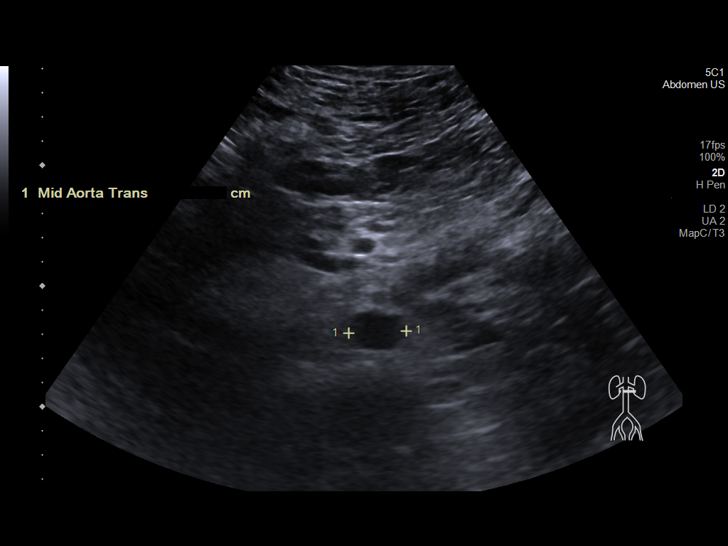
[im 9/26]
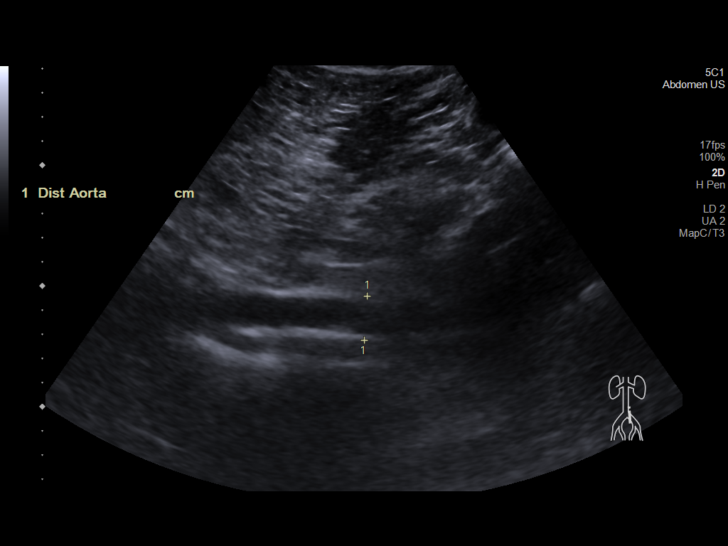
[im 10/26]
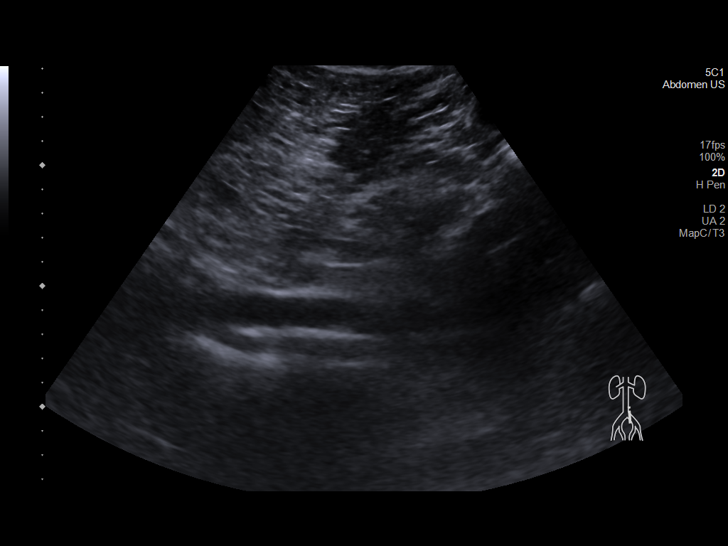
[im 12/26]
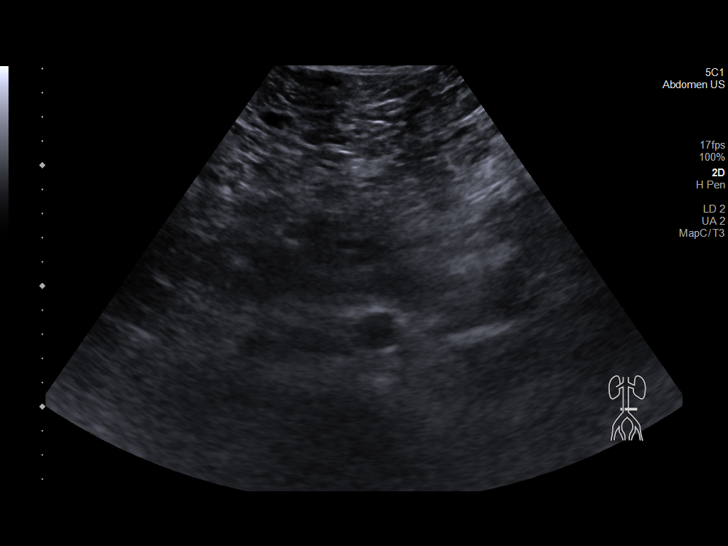
[im 14/26]
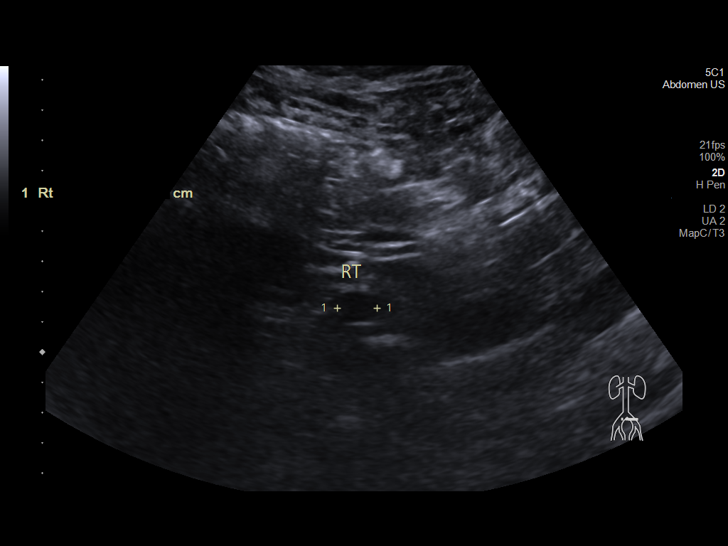
[im 16/26]
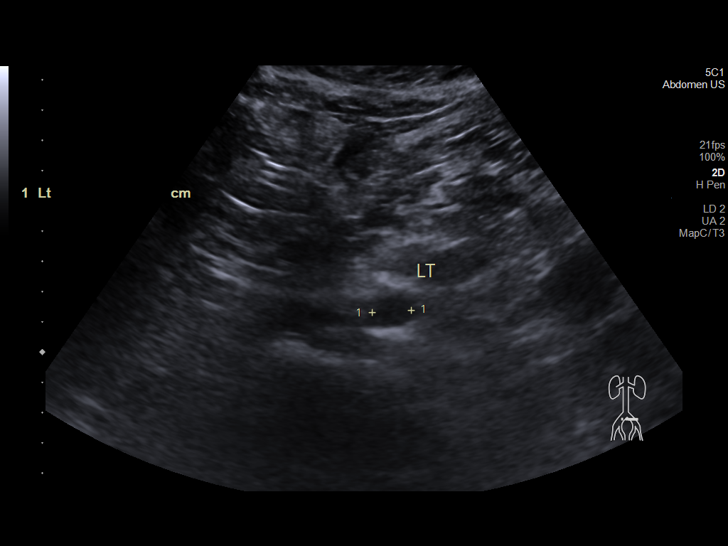
[im 17/26]
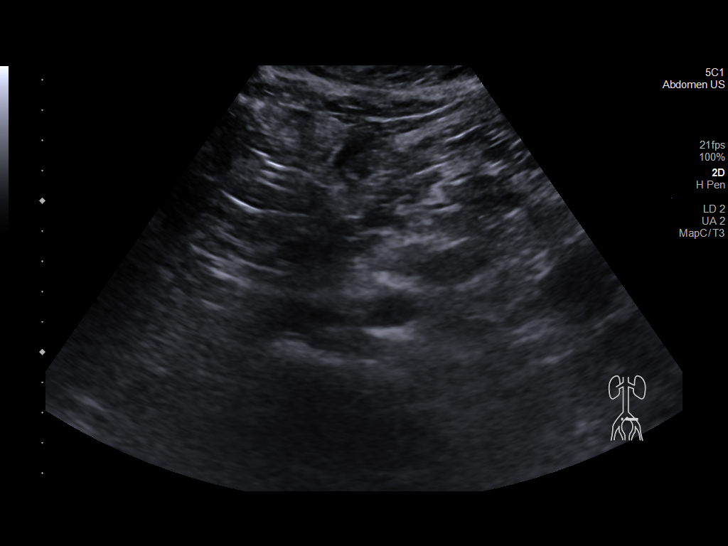
[im 19/26]
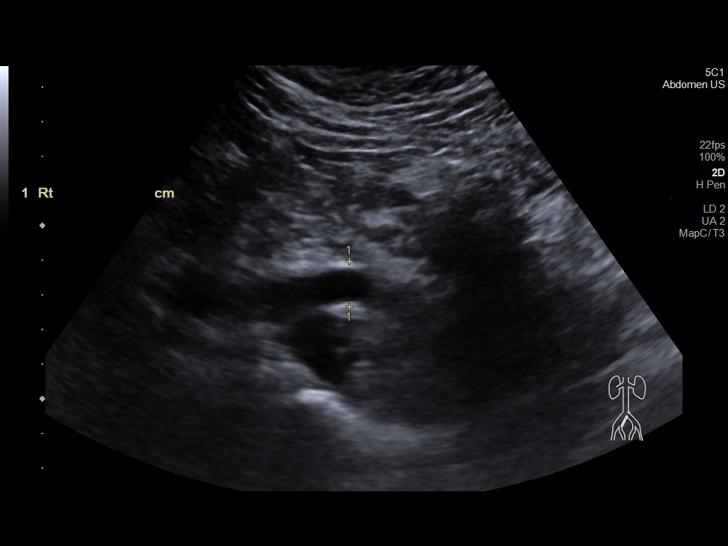
[im 21/26]
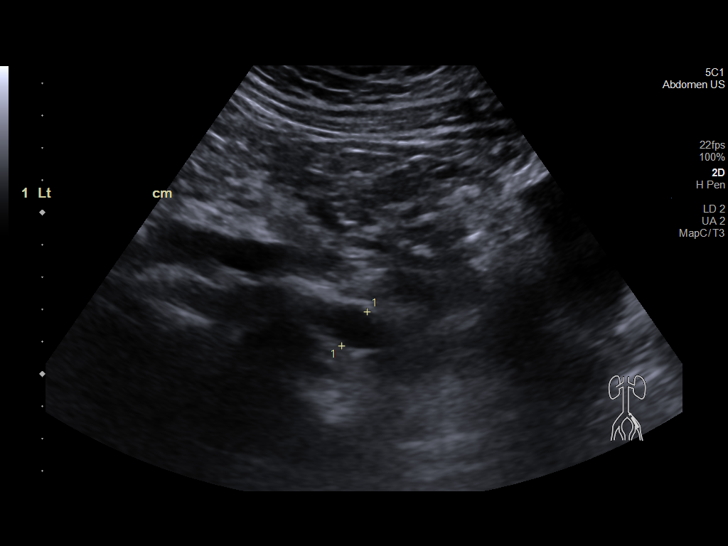
[im 23/26]
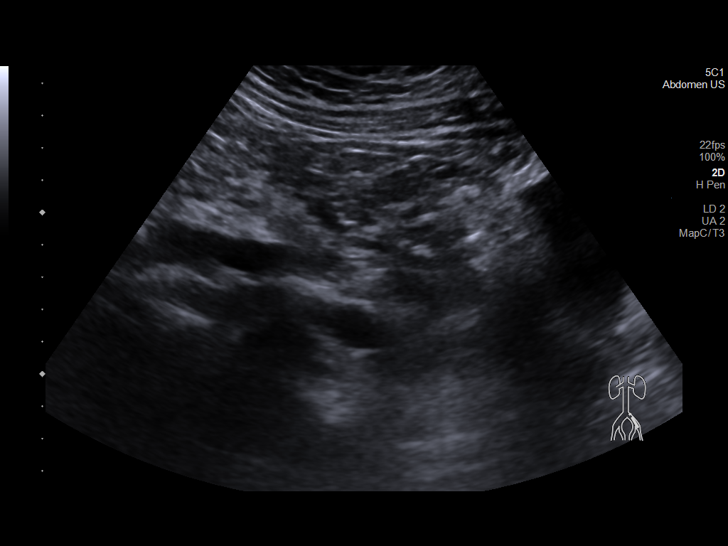
[im 26/26]
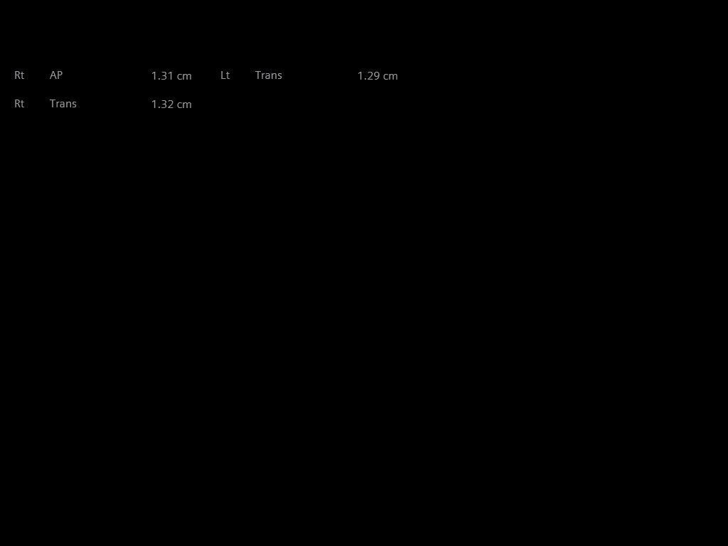

[14 of 25 positions shown; findings below may reference images not displayed]

FINDINGS: Abdominal aortic measurements as follows:

Proximal: 2.8 x 3.0 cm. The proximal abdominal aorta is not well
visualized and measurements may be somewhat inaccurate.

Mid:  2.2 x 2.4 cm

Distal:  1.8 x 2.1 cm
Patent: Yes, peak systolic velocity is 102 cm/s

Right common iliac artery: 1.3 cm

Left common iliac artery: 1.3 cm

Calcified plaque present in the abdominal aorta.
IMPRESSION: Top-normal/borderline enlarged caliber of the proximal abdominal
aorta. This segment of the abdominal aorta is not well visualized by
ultrasound and measurements may be somewhat inaccurate. Consider
follow-up ultrasound.

3 cm abdominal aortic aneurysm. Recommend follow-up every 3 years.

Reference: [HOSPITAL] 7108;[DATE].

## 2022-05-17 ENCOUNTER — Encounter: Payer: Self-pay | Admitting: Family Medicine

## 2022-05-17 ENCOUNTER — Ambulatory Visit (INDEPENDENT_AMBULATORY_CARE_PROVIDER_SITE_OTHER): Payer: Medicare HMO | Admitting: Family Medicine

## 2022-05-17 VITALS — BP 140/87 | HR 86 | Temp 98.4°F | Ht 69.0 in | Wt 217.4 lb

## 2022-05-17 DIAGNOSIS — L989 Disorder of the skin and subcutaneous tissue, unspecified: Secondary | ICD-10-CM

## 2022-05-17 DIAGNOSIS — L219 Seborrheic dermatitis, unspecified: Secondary | ICD-10-CM

## 2022-05-17 DIAGNOSIS — G250 Essential tremor: Secondary | ICD-10-CM | POA: Diagnosis not present

## 2022-05-17 DIAGNOSIS — M1990 Unspecified osteoarthritis, unspecified site: Secondary | ICD-10-CM | POA: Diagnosis not present

## 2022-05-17 MED ORDER — PREDNISONE 10 MG (21) PO TBPK: ORAL_TABLET | ORAL | 0 refills | Status: DC

## 2022-05-17 NOTE — Progress Notes (Unsigned)
Assessment & Plan:  1. Arthritis Encouraged use of Voltaren gel. Offered x-ray; patient declined.  2. Essential tremor Education provided on essential tremor. Discussed their are medications to treat if it gets worse and he feels he needs something. Advised to let us know if he starts to notice a tremor at rest, as he will need a referral to neurology.  3. Skin lesion of face - Ambulatory referral to Dermatology   Follow up plan: Return if symptoms worsen or fail to improve.  Hendricks Limes, MSN, APRN, FNP-C [facility]  Subjective:   [name], male    DOB: March 27, 1956, 66 y.o.   MRN: [id number]  HPI: James Davenport is a 66 y.o. male presenting on 05/17/2022 for Joint Pain (Ongoing joint pain in this fingers that has gotten worse ), Tremors (Bilateral hands x 6 weeks on and off), and Rash (On face. Patient is on ketoconazole and it is not helping )  Patient reports ongoing pain in his fingers that is getting worse. He reports he can't tie shoe laces or do tedious tasks due to the pain. He does not feel it is related to his cholesterol medication since in the past when it was these medications the joint pain was in multiple areas and not just his hands.   He also has a tremor of his hands that his family pointed out to him within the past six weeks. They notice it when he is eating. He is unsure if he tremors at rest. His dad does also have a tremor but he is unsure if his is at rest or with action. He does not have a known family history of Parkinson's disease.  He has a rash on his face that is getting larger and closer to his right eye. He has been applying ketoconazole, which has not been effective. He had an appointment with Dr. Tarri Glenn, but they had to cancel his appointment and never reached back out to him.    ROS: Negative unless specifically indicated above in HPI.   Relevant past medical history reviewed and updated as  indicated.   Allergies and medications reviewed and updated.   Current Outpatient Medications:    acetaminophen (TYLENOL) 500 MG tablet, Take 500 mg by mouth every 6 (six) hours as needed., Disp: , Rfl:    albuterol (VENTOLIN HFA) 108 (90 Base) MCG/ACT inhaler, INHALE 2 PUFFS INTO THE LUNGS EVERY 6 (SIX) HOURS AS NEEDED., Disp: 18 g, Rfl: 0   aspirin 81 MG chewable tablet, Chew 1 tablet by mouth daily at 2 PM., Disp: , Rfl:    cyclobenzaprine (FLEXERIL) 10 MG tablet, TAKE 1 TABLET DAILY AS NEEDED FOR MUSCLE SPASMS., Disp: 30 tablet, Rfl: 2   EPINEPHrine 0.3 mg/0.3 mL IJ SOAJ injection, Inject 0.3 mg into the muscle as needed for anaphylaxis., Disp: 2 each, Rfl: 1   fluticasone (FLONASE) 50 MCG/ACT nasal spray, SPRAY 2 SPRAYS INTO EACH NOSTRIL EVERY DAY, Disp: 16 mL, Rfl: 6   gabapentin (NEURONTIN) 300 MG capsule, TAKE 1 CAPSULE TWICE DAILY, Disp: 180 capsule, Rfl: 1   ketoconazole (NIZORAL) 2 % cream, Apply 1 application topically daily., Disp: 90 g, Rfl: 0   ketoconazole (NIZORAL) 2 % shampoo, APPLY 1 APPLICATION TOPICALLY 2 (TWO) TIMES A WEEK. LEAVE ON FOR 3-5 MINUTES BEFORE RINSING., Disp: 120 mL, Rfl: 0   levocetirizine (XYZAL) 5 MG tablet, TAKE 1 TABLET  EVERY EVENING., Disp: 90 tablet, Rfl: 3   meloxicam (MOBIC) 15 MG tablet,  Take 1 tablet (15 mg total) by mouth daily., Disp: 90 tablet, Rfl: 3   NEXLIZET 180-10 MG TABS, TAKE 1 TABLET EVERY DAY, Disp: 90 tablet, Rfl: 0   pantoprazole (PROTONIX) 40 MG tablet, Take 1 tablet (40 mg total) by mouth daily., Disp: 90 tablet, Rfl: 3   Tiotropium Bromide Monohydrate (SPIRIVA RESPIMAT) 1.25 MCG/ACT AERS, Inhale 2 puffs into the lungs daily., Disp: 12 g, Rfl: 1  Allergies  Allergen Reactions   Statins Other (See Comments)    Polyarthralgia   Tramadol Nausea And Vomiting    Objective:   BP (!) 140/87   Pulse 86   Temp 98.4 F (36.9 C) (Temporal)   Ht '5\' 9"'$  (1.753 m)   Wt 217 lb 6.4 oz (98.6 kg)   SpO2 94%   BMI 32.10 kg/m     Physical Exam Vitals reviewed.  Constitutional:      General: He is not in acute distress.    Appearance: Normal appearance. He is not ill-appearing, toxic-appearing or diaphoretic.  HENT:     Head: Normocephalic and atraumatic.  Eyes:     General: No scleral icterus.       Right eye: No discharge.        Left eye: No discharge.     Conjunctiva/sclera: Conjunctivae normal.  Cardiovascular:     Rate and Rhythm: Normal rate.  Pulmonary:     Effort: Pulmonary effort is normal. No respiratory distress.  Musculoskeletal:        General: Normal range of motion.     Right hand: Bony tenderness (fingers) present.     Left hand: Bony tenderness (fingers) present.     Cervical back: Normal range of motion.  Skin:    General: Skin is warm and dry.     Comments: Red patch on right temple.  Neurological:     Mental Status: He is alert and oriented to person, place, and time. Mental status is at baseline.     Motor: Tremor (mild with action) present.  Psychiatric:        Mood and Affect: Mood normal.        Behavior: Behavior normal.        Thought Content: Thought content normal.        Judgment: Judgment normal.

## 2022-05-17 NOTE — Patient Instructions (Signed)
Voltaren gel for your hands.

## 2022-05-18 ENCOUNTER — Other Ambulatory Visit: Payer: Self-pay | Admitting: Family Medicine

## 2022-05-18 DIAGNOSIS — R252 Cramp and spasm: Secondary | ICD-10-CM

## 2022-05-18 DIAGNOSIS — L219 Seborrheic dermatitis, unspecified: Secondary | ICD-10-CM

## 2022-06-10 ENCOUNTER — Other Ambulatory Visit: Payer: Self-pay | Admitting: Family Medicine

## 2022-06-10 DIAGNOSIS — K219 Gastro-esophageal reflux disease without esophagitis: Secondary | ICD-10-CM

## 2022-06-10 DIAGNOSIS — M1990 Unspecified osteoarthritis, unspecified site: Secondary | ICD-10-CM

## 2022-06-13 ENCOUNTER — Other Ambulatory Visit: Payer: Self-pay | Admitting: Family Medicine

## 2022-06-13 DIAGNOSIS — I700 Atherosclerosis of aorta: Secondary | ICD-10-CM

## 2022-06-13 DIAGNOSIS — Z91030 Bee allergy status: Secondary | ICD-10-CM

## 2022-06-13 DIAGNOSIS — E785 Hyperlipidemia, unspecified: Secondary | ICD-10-CM

## 2022-06-17 ENCOUNTER — Encounter: Payer: Self-pay | Admitting: Family Medicine

## 2022-06-17 DIAGNOSIS — M1990 Unspecified osteoarthritis, unspecified site: Secondary | ICD-10-CM

## 2022-06-17 DIAGNOSIS — K219 Gastro-esophageal reflux disease without esophagitis: Secondary | ICD-10-CM

## 2022-06-18 MED ORDER — MELOXICAM 15 MG PO TABS: 15.0000 mg | ORAL_TABLET | Freq: Every day | ORAL | 0 refills | Status: DC

## 2022-06-18 MED ORDER — PANTOPRAZOLE SODIUM 40 MG PO TBEC: 40.0000 mg | DELAYED_RELEASE_TABLET | Freq: Every day | ORAL | 0 refills | Status: DC

## 2022-06-25 LAB — COLOGUARD: Cologuard: NEGATIVE

## 2022-07-02 ENCOUNTER — Encounter: Payer: Self-pay | Admitting: Family Medicine

## 2022-07-02 DIAGNOSIS — H524 Presbyopia: Secondary | ICD-10-CM | POA: Diagnosis not present

## 2022-07-03 ENCOUNTER — Other Ambulatory Visit: Payer: Self-pay | Admitting: Family Medicine

## 2022-07-03 DIAGNOSIS — L219 Seborrheic dermatitis, unspecified: Secondary | ICD-10-CM

## 2022-08-18 ENCOUNTER — Other Ambulatory Visit: Payer: Self-pay | Admitting: Family Medicine

## 2022-08-18 DIAGNOSIS — R252 Cramp and spasm: Secondary | ICD-10-CM

## 2022-08-18 DIAGNOSIS — L219 Seborrheic dermatitis, unspecified: Secondary | ICD-10-CM

## 2022-08-19 NOTE — Telephone Encounter (Signed)
Last OV 05/17/22. Last RF 05/20/22 #30 2RF. Next OV 09/11/22  Patient was seeing Alona Bene, NP - will now be seeing Lilia Pro

## 2022-08-23 ENCOUNTER — Other Ambulatory Visit: Payer: Self-pay | Admitting: Family Medicine

## 2022-08-23 DIAGNOSIS — R0602 Shortness of breath: Secondary | ICD-10-CM

## 2022-09-10 ENCOUNTER — Other Ambulatory Visit: Payer: Self-pay | Admitting: Family Medicine

## 2022-09-10 DIAGNOSIS — L219 Seborrheic dermatitis, unspecified: Secondary | ICD-10-CM

## 2022-09-11 ENCOUNTER — Ambulatory Visit (INDEPENDENT_AMBULATORY_CARE_PROVIDER_SITE_OTHER): Payer: Medicare HMO | Admitting: Family Medicine

## 2022-09-11 ENCOUNTER — Encounter: Payer: Medicare HMO | Admitting: Family Medicine

## 2022-09-11 ENCOUNTER — Ambulatory Visit (INDEPENDENT_AMBULATORY_CARE_PROVIDER_SITE_OTHER): Payer: Medicare HMO

## 2022-09-11 ENCOUNTER — Encounter: Payer: Self-pay | Admitting: Family Medicine

## 2022-09-11 VITALS — BP 129/78 | HR 67 | Temp 98.0°F | Ht 69.0 in | Wt 213.2 lb

## 2022-09-11 DIAGNOSIS — L989 Disorder of the skin and subcutaneous tissue, unspecified: Secondary | ICD-10-CM | POA: Diagnosis not present

## 2022-09-11 DIAGNOSIS — M5442 Lumbago with sciatica, left side: Secondary | ICD-10-CM

## 2022-09-11 DIAGNOSIS — E538 Deficiency of other specified B group vitamins: Secondary | ICD-10-CM | POA: Diagnosis not present

## 2022-09-11 DIAGNOSIS — I700 Atherosclerosis of aorta: Secondary | ICD-10-CM | POA: Diagnosis not present

## 2022-09-11 DIAGNOSIS — R252 Cramp and spasm: Secondary | ICD-10-CM | POA: Diagnosis not present

## 2022-09-11 DIAGNOSIS — L219 Seborrheic dermatitis, unspecified: Secondary | ICD-10-CM

## 2022-09-11 DIAGNOSIS — R6889 Other general symptoms and signs: Secondary | ICD-10-CM | POA: Diagnosis not present

## 2022-09-11 DIAGNOSIS — Z0000 Encounter for general adult medical examination without abnormal findings: Secondary | ICD-10-CM | POA: Diagnosis not present

## 2022-09-11 DIAGNOSIS — G8929 Other chronic pain: Secondary | ICD-10-CM

## 2022-09-11 DIAGNOSIS — E785 Hyperlipidemia, unspecified: Secondary | ICD-10-CM

## 2022-09-11 DIAGNOSIS — B351 Tinea unguium: Secondary | ICD-10-CM

## 2022-09-11 DIAGNOSIS — R0602 Shortness of breath: Secondary | ICD-10-CM

## 2022-09-11 DIAGNOSIS — Z789 Other specified health status: Secondary | ICD-10-CM

## 2022-09-11 DIAGNOSIS — Z0001 Encounter for general adult medical examination with abnormal findings: Secondary | ICD-10-CM

## 2022-09-11 DIAGNOSIS — M1990 Unspecified osteoarthritis, unspecified site: Secondary | ICD-10-CM | POA: Diagnosis not present

## 2022-09-11 DIAGNOSIS — K219 Gastro-esophageal reflux disease without esophagitis: Secondary | ICD-10-CM | POA: Diagnosis not present

## 2022-09-11 DIAGNOSIS — J302 Other seasonal allergic rhinitis: Secondary | ICD-10-CM

## 2022-09-11 DIAGNOSIS — M545 Low back pain, unspecified: Secondary | ICD-10-CM | POA: Diagnosis not present

## 2022-09-11 DIAGNOSIS — L602 Onychogryphosis: Secondary | ICD-10-CM

## 2022-09-11 DIAGNOSIS — I714 Abdominal aortic aneurysm, without rupture, unspecified: Secondary | ICD-10-CM

## 2022-09-11 MED ORDER — CYCLOBENZAPRINE HCL 10 MG PO TABS: ORAL_TABLET | ORAL | 2 refills | Status: DC

## 2022-09-11 MED ORDER — PANTOPRAZOLE SODIUM 40 MG PO TBEC: 40.0000 mg | DELAYED_RELEASE_TABLET | Freq: Every day | ORAL | 1 refills | Status: DC

## 2022-09-11 MED ORDER — SPIRIVA RESPIMAT 1.25 MCG/ACT IN AERS: INHALATION_SPRAY | RESPIRATORY_TRACT | 0 refills | Status: DC

## 2022-09-11 MED ORDER — LEVOCETIRIZINE DIHYDROCHLORIDE 5 MG PO TABS: 5.0000 mg | ORAL_TABLET | Freq: Every evening | ORAL | 3 refills | Status: DC

## 2022-09-11 MED ORDER — NEXLIZET 180-10 MG PO TABS: 1.0000 | ORAL_TABLET | Freq: Every day | ORAL | 0 refills | Status: DC

## 2022-09-11 MED ORDER — FLUTICASONE PROPIONATE 50 MCG/ACT NA SUSP: NASAL | 6 refills | Status: DC

## 2022-09-11 MED ORDER — DICLOFENAC SODIUM 75 MG PO TBEC: 75.0000 mg | DELAYED_RELEASE_TABLET | Freq: Two times a day (BID) | ORAL | 1 refills | Status: DC | PRN

## 2022-09-11 MED ORDER — GABAPENTIN 300 MG PO CAPS: 300.0000 mg | ORAL_CAPSULE | Freq: Two times a day (BID) | ORAL | 1 refills | Status: DC

## 2022-09-11 MED ORDER — MELOXICAM 15 MG PO TABS: 15.0000 mg | ORAL_TABLET | Freq: Every day | ORAL | 0 refills | Status: DC

## 2022-09-11 NOTE — Progress Notes (Unsigned)
Complete physical exam  Patient: James Davenport   DOB: December 18, 1955   66 y.o. Male  MRN: [id number]  Subjective:    Chief Complaint  Patient presents with   Annual Exam    James Davenport is a 66 y.o. male who presents today for a complete physical exam. He reports consuming a general diet. Exercise is limited by orthopedic condition(s): arthritis, chronic back pain. He generally feels fairly well. He reports sleeping fairly well. He does have additional problems to discuss today.   He needs a new referral to dermatology for his skin conditions as well as for skin cancer screening. There is a family history of skin cancer. He was previously referred to Dr. Tarri Glenn, but he is not longer accepting new patients.   He has a history of chronic lower back pain with sciatica on the left side. He reports that he had imaging down years ago. He reports that the pain has been gradually worsening and he has difficulty with ADLs because of the pain. He takes mobic prn with some relief but would to try voltaren to see if this helps more. He has a lot of muscle cramps in his legs as well. He takes flexeril prn and gabapentin daily which does help. He denies fever, injury, changes in bowel or bladder control, or saddle anesthesia. He also has arthritis in his hands. Of note, he mentions that he had to have B12 injections at one point but is no longer on a supplement.   He also has long, thick, yellow toe nails. They are so thick that he is unable to trim them. He is interested in a referral to podiatry.   He has a history of GERD. He reports that this is well controlled with protonix.  He reports chronic shortness of breath. He uses spiriva daily and albuterol prn. He has never been diagnosis with COPD or asthma. He is a former smoker and quite many years ago. He also work in [employer] for many years. He has had a normal CXR.   He is currently taking his nexlizet about 3x a week and is tolerating this. Has  previously failed atorvastatin, lovastatin, and pravastatin.   Most recent fall risk assessment:    09/11/2022    9:59 AM  Fall Risk   Falls in the past year? 0     Most recent depression screenings:    09/11/2022   10:02 AM 03/07/2022   10:16 AM  PHQ 2/9 Scores  PHQ - 2 Score  0  PHQ- 9 Score  0  Exception Documentation Patient refusal     Vision:Within last year and Dental: No current dental problems and No regular dental care   Past Medical History:  Diagnosis Date   AAA (abdominal aortic aneurysm) without rupture ([place]) 09/19/2021   Aortic atherosclerosis ([place])    Arthritis 11/24/2020   Bilateral carpal tunnel syndrome 11/24/2020   Chronic left shoulder pain 11/24/2020   GERD (gastroesophageal reflux disease) 11/24/2020   Hyperlipidemia LDL goal <100 01/31/2016   Hypertension    Leg cramps 11/24/2020      Patient Care Team: Gwenlyn Perking, FNP as PCP - General (Family Medicine) Gala Romney Cristopher Estimable, MD as Consulting Physician (Gastroenterology) Gwenlyn Perking, FNP (Family Medicine)   Outpatient Medications Prior to Visit  Medication Sig   acetaminophen (TYLENOL) 500 MG tablet Take 500 mg by mouth every 6 (six) hours as needed.   albuterol (VENTOLIN HFA) 108 (90 Base) MCG/ACT inhaler INHALE 2 PUFFS INTO  THE LUNGS EVERY 6 (SIX) HOURS AS NEEDED.   aspirin 81 MG chewable tablet Chew 1 tablet by mouth daily at 2 PM.   cyclobenzaprine (FLEXERIL) 10 MG tablet TAKE 1 TABLET DAILY AS NEEDED FOR MUSCLE SPASMS.   EPINEPHRINE 0.3 mg/0.3 mL IJ SOAJ injection INJECT 0.3 MG INTO THE MUSCLE AS NEEDED FOR ANAPHYLAXIS.   fluticasone (FLONASE) 50 MCG/ACT nasal spray SPRAY 2 SPRAYS INTO EACH NOSTRIL EVERY DAY   gabapentin (NEURONTIN) 300 MG capsule TAKE 1 CAPSULE TWICE DAILY   ketoconazole (NIZORAL) 2 % cream Apply 1 application topically daily.   ketoconazole (NIZORAL) 2 % shampoo APPLY TOPICALLY 2 TIMES A WEEK. LEAVE ON FOR 3 TO 5 MINUTES BEFORE RINSING.   levocetirizine (XYZAL) 5  MG tablet TAKE 1 TABLET  EVERY EVENING.   meloxicam (MOBIC) 15 MG tablet Take 1 tablet (15 mg total) by mouth daily.   NEXLIZET 180-10 MG TABS TAKE 1 TABLET EVERY DAY   pantoprazole (PROTONIX) 40 MG tablet Take 1 tablet (40 mg total) by mouth daily.   Tiotropium Bromide Monohydrate (SPIRIVA RESPIMAT) 1.25 MCG/ACT AERS INHALE 2 PUFFS ONE TIME DAILY   [DISCONTINUED] predniSONE (STERAPRED UNI-PAK 21 TAB) 10 MG (21) TBPK tablet As directed x 6 days   No facility-administered medications prior to visit.    ROS Negative unless specially indicated above in HPI.     Objective:     BP 129/78   Pulse 67   Temp 98 F (36.7 C) (Temporal)   Ht _0  (1.753 m)   Wt 213 lb 4 oz (96.7 kg)   SpO2 99%   BMI 31.49 kg/m  BP Readings from Last 3 Encounters:  05/17/22 (!) 140/87  03/07/22 (!) 141/87  01/23/22 130/84   Wt Readings from Last 3 Encounters:  09/11/22 213 lb 4 oz (96.7 kg)  05/17/22 217 lb 6.4 oz (98.6 kg)  03/07/22 213 lb 12.8 oz (97 kg)      Physical Exam Vitals and nursing note reviewed.  Constitutional:      General: He is not in acute distress.    Appearance: He is not ill-appearing.  HENT:     Head: Normocephalic.     Right Ear: Tympanic membrane, ear canal and external ear normal.     Left Ear: Tympanic membrane, ear canal and external ear normal.     Nose: Nose normal.     Mouth/Throat:     Mouth: Mucous membranes are moist.     Pharynx: Oropharynx is clear.  Eyes:     Extraocular Movements: Extraocular movements intact.     Conjunctiva/sclera: Conjunctivae normal.     Pupils: Pupils are equal, round, and reactive to light.  Neck:     Thyroid: No thyroid mass, thyromegaly or thyroid tenderness.  Cardiovascular:     Rate and Rhythm: Normal rate and regular rhythm.     Pulses: Normal pulses.     Heart sounds: Normal heart sounds. No murmur heard.    No friction rub. No gallop.  Pulmonary:     Effort: Pulmonary effort is normal.     Breath sounds: Normal  breath sounds.  Abdominal:     General: Bowel sounds are normal. There is no distension.     Palpations: Abdomen is soft. There is no mass.     Tenderness: There is no abdominal tenderness. There is no guarding.  Musculoskeletal:        General: No swelling.     Cervical back: Normal range of motion and neck  supple. No tenderness.     Lumbar back: Tenderness present. No swelling, edema, deformity, signs of trauma or bony tenderness.     Right lower leg: No edema.     Left lower leg: No edema.  Skin:    General: Skin is warm and dry.     Capillary Refill: Capillary refill takes less than 2 seconds.     Findings: No lesion or rash.     Comments: Yellow, thick toe nails with fungus present  Neurological:     General: No focal deficit present.     Mental Status: He is alert and oriented to person, place, and time.     Cranial Nerves: No cranial nerve deficit.     Motor: No weakness.  Psychiatric:        Mood and Affect: Mood normal.        Behavior: Behavior normal.        Thought Content: Thought content normal.        Judgment: Judgment normal.      No results found for any visits on 09/11/22.     Assessment & Plan:    Routine Health Maintenance and Physical Exam  Bucky was seen today for annual exam.  Diagnoses and all orders for this visit:  Routine general medical examination at a health care facility -     CBC with Differential/Platelet -     CMP14+EGFR -     Lipid panel -     TSH  Gastroesophageal reflux disease, unspecified whether esophagitis present Well controlled on current regimen.  -     pantoprazole (PROTONIX) 40 MG tablet; Take 1 tablet (40 mg total) by mouth daily.  Chronic midline low back pain with left-sided sciatica Worsening pain. Xray today in office, results pending. Referral to PT. No red flags.  -     Ambulatory referral to Physical Therapy -     DG Lumbar Spine 2-3 Views; Future  Arthritis Wants to try voltaren. Will swtich from mobic  to voltaren.  -     diclofenac (VOLTAREN) 75 MG EC tablet; Take 1 tablet (75 mg total) by mouth 2 (two) times daily as needed.  Leg cramps Well controlled on current regimen.  -     cyclobenzaprine (FLEXERIL) 10 MG tablet; TAKE 1 TABLET DAILY AS NEEDED FOR MUSCLE SPASMS. -     gabapentin (NEURONTIN) 300 MG capsule; Take 1 capsule (300 mg total) by mouth 2 (two) times daily.  Aortic atherosclerosis ([facility]) Continue aspirin.   Hyperlipidemia LDL goal <100 Labs pending. Continue nexlizet. Statin intolerant.         -     Bempedoic Acid-Ezetimibe (NEXLIZET) 180-10 MG TABS; Take 1 tablet by mouth daily.  Skin lesion of face Skin lesion of neck Seborrheic dermatitis -     Ambulatory referral to Dermatology  Abdominal aortic aneurysm (AAA) without rupture, unspecified part ([place]) Reviewed last [place]. Recommended repeat in 3 years.   Seasonal allergies Well controlled on current regimen.  -     fluticasone (FLONASE) 50 MCG/ACT nasal spray; SPRAY 2 SPRAYS INTO EACH NOSTRIL EVERY DAY -     levocetirizine (XYZAL) 5 MG tablet; Take 1 tablet (5 mg total) by mouth every evening.  Shortness of breath Well controlled on current regimen.  -     Tiotropium Bromide Monohydrate (SPIRIVA RESPIMAT) 1.25 MCG/ACT AERS; INHALE 2 PUFFS ONE TIME DAILY  B12 deficiency -     Vitamin B12  Thickened nails Nail fungus Referral to podiatry.  Discussed lamisil pending LFTs.  -     Ambulatory referral to Podiatry   Immunization History  Administered Date(s) Administered   Tdap 10/07/2016    Health Maintenance  Topic Date Due   Zoster Vaccines- Shingrix (1 of 2) Never done   Medicare Annual Wellness (AWV)  07/12/2022   Pneumonia Vaccine 7+ Years old (1 - PCV) 09/12/2022 (Originally 09/04/2020)   INFLUENZA VACCINE  01/05/2023 (Originally 05/07/2022)   COVID-19 Vaccine (1) 09/28/2023 (Originally 03/04/1957)   Fecal DNA (Cologuard)  06/25/2025   DTaP/Tdap/Td (2 - Td or Tdap) 10/07/2026   Hepatitis C Screening   Completed   HPV VACCINES  Aged Out   COLONOSCOPY (Pts 45-43yr Insurance coverage will need to be confirmed)  Discontinued    Discussed health benefits of physical activity, and encouraged him to engage in regular exercise appropriate for his age and condition.  Problem List Items Addressed This Visit       Cardiovascular and Mediastinum   [place] atherosclerosis ([place]   Relevant Medications   Bempedoic Acid-Ezetimibe (NEXLIZET) 180-10 MG TABS   AAA (abdominal aortic aneurysm) without rupture ([facility])   Relevant Medications   Bempedoic Acid-Ezetimibe (NEXLIZET) 180-10 MG TABS     Digestive   GERD (gastroesophageal reflux disease)   Relevant Medications   pantoprazole (PROTONIX) 40 MG tablet     Musculoskeletal and Integument   Arthritis   Relevant Medications   cyclobenzaprine (FLEXERIL) 10 MG tablet   diclofenac (VOLTAREN) 75 MG EC tablet   Seborrheic dermatitis   Relevant Orders   Ambulatory referral to Dermatology     Other   Hyperlipidemia LDL goal <100   Relevant Medications   Bempedoic Acid-Ezetimibe (NEXLIZET) 180-10 MG TABS   Leg cramps   Relevant Medications   cyclobenzaprine (FLEXERIL) 10 MG tablet   gabapentin (NEURONTIN) 300 MG capsule   Seasonal allergies   Relevant Medications   fluticasone (FLONASE) 50 MCG/ACT nasal spray   levocetirizine (XYZAL) 5 MG tablet   Shortness of breath   Relevant Medications   Tiotropium Bromide Monohydrate (SPIRIVA RESPIMAT) 1.25 MCG/ACT AERS   Other Visit Diagnoses     Routine general medical examination at a health care facility    -  Primary   Relevant Orders   CBC with Differential/Platelet   CMP14+EGFR   Lipid panel   TSH   Chronic midline low back pain with left-sided sciatica       Relevant Medications   cyclobenzaprine (FLEXERIL) 10 MG tablet   gabapentin (NEURONTIN) 300 MG capsule   diclofenac (VOLTAREN) 75 MG EC tablet   Other Relevant Orders   Ambulatory referral to Physical Therapy   DG Lumbar Spine  2-3 Views   Skin lesion of face       Relevant Orders   Ambulatory referral to Dermatology   Skin lesion of neck       Relevant Orders   Ambulatory referral to Dermatology   B12 deficiency       Relevant Orders   Vitamin B12   Thickened nails       Relevant Orders   Ambulatory referral to Podiatry   Nail fungus       Relevant Orders   Ambulatory referral to Podiatry      Return in about 6 weeks (around 10/23/2022) for back pain.   The patient indicates understanding of these issues and agrees with the plan.  TGwenlyn Perking FNP

## 2022-09-11 NOTE — Patient Instructions (Signed)
Health Maintenance, Male Adopting a healthy lifestyle and getting preventive care are important in promoting health and wellness. Ask your health care provider about: The right schedule for you to have regular tests and exams. Things you can do on your own to prevent diseases and keep yourself healthy. What should I know about diet, weight, and exercise? Eat a healthy diet  Eat a diet that includes plenty of vegetables, fruits, low-fat dairy products, and lean protein. Do not eat a lot of foods that are high in solid fats, added sugars, or sodium. Maintain a healthy weight Body mass index (BMI) is a measurement that can be used to identify possible weight problems. It estimates body fat based on height and weight. Your health care provider can help determine your BMI and help you achieve or maintain a healthy weight. Get regular exercise Get regular exercise. This is one of the most important things you can do for your health. Most adults should: Exercise for at least 150 minutes each week. The exercise should increase your heart rate and make you sweat (moderate-intensity exercise). Do strengthening exercises at least twice a week. This is in addition to the moderate-intensity exercise. Spend less time sitting. Even light physical activity can be beneficial. Watch cholesterol and blood lipids Have your blood tested for lipids and cholesterol at 67 years of age, then have this test every 5 years. You may need to have your cholesterol levels checked more often if: Your lipid or cholesterol levels are high. You are older than 67 years of age. You are at high risk for heart disease. What should I know about cancer screening? Many types of cancers can be detected early and may often be prevented. Depending on your health history and family history, you may need to have cancer screening at various ages. This may include screening for: Colorectal cancer. Prostate cancer. Skin cancer. Lung  cancer. What should I know about heart disease, diabetes, and high blood pressure? Blood pressure and heart disease High blood pressure causes heart disease and increases the risk of stroke. This is more likely to develop in people who have high blood pressure readings or are overweight. Talk with your health care provider about your target blood pressure readings. Have your blood pressure checked: Every 3-5 years if you are 18-39 years of age. Every year if you are 40 years old or older. If you are between the ages of 65 and 75 and are a current or former smoker, ask your health care provider if you should have a one-time screening for abdominal aortic aneurysm (AAA). Diabetes Have regular diabetes screenings. This checks your fasting blood sugar level. Have the screening done: Once every three years after age 45 if you are at a normal weight and have a low risk for diabetes. More often and at a younger age if you are overweight or have a high risk for diabetes. What should I know about preventing infection? Hepatitis B If you have a higher risk for hepatitis B, you should be screened for this virus. Talk with your health care provider to find out if you are at risk for hepatitis B infection. Hepatitis C Blood testing is recommended for: Everyone born from 1945 through 1965. Anyone with known risk factors for hepatitis C. Sexually transmitted infections (STIs) You should be screened each year for STIs, including gonorrhea and chlamydia, if: You are sexually active and are younger than 67 years of age. You are older than 67 years of age and your   health care provider tells you that you are at risk for this type of infection. Your sexual activity has changed since you were last screened, and you are at increased risk for chlamydia or gonorrhea. Ask your health care provider if you are at risk. Ask your health care provider about whether you are at high risk for HIV. Your health care provider  may recommend a prescription medicine to help prevent HIV infection. If you choose to take medicine to prevent HIV, you should first get tested for HIV. You should then be tested every 3 months for as long as you are taking the medicine. Follow these instructions at home: Alcohol use Do not drink alcohol if your health care provider tells you not to drink. If you drink alcohol: Limit how much you have to 0-2 drinks a day. Know how much alcohol is in your drink. In the [place] one drink equals one 12 oz bottle of beer (355 mL), one 5 oz glass of wine (148 mL), or one 1 oz glass of hard liquor (44 mL). Lifestyle Do not use any products that contain nicotine or tobacco. These products include cigarettes, chewing tobacco, and vaping devices, such as e-cigarettes. If you need help quitting, ask your health care provider. Do not use street drugs. Do not share needles. Ask your health care provider for help if you need support or information about quitting drugs. General instructions Schedule regular health, dental, and eye exams. Stay current with your vaccines. Tell your health care provider if: You often feel depressed. You have ever been abused or do not feel safe at home. Summary Adopting a healthy lifestyle and getting preventive care are important in promoting health and wellness. Follow your health care provider's instructions about healthy diet, exercising, and getting tested or screened for diseases. Follow your health care provider's instructions on monitoring your cholesterol and blood pressure. This information is not intended to replace advice given to you by your health care provider. Make sure you discuss any questions you have with your health care provider. Document Revised: 02/12/2021 Document Reviewed: 02/12/2021 Elsevier Patient Education  2023 [employer]  

## 2022-09-12 ENCOUNTER — Other Ambulatory Visit: Payer: Self-pay | Admitting: Family Medicine

## 2022-09-12 LAB — CMP14+EGFR
ALT: 22 IU/L (ref 0–44)
AST: 22 IU/L (ref 0–40)
Albumin/Globulin Ratio: 1.7 (ref 1.2–2.2)
Albumin: 4.5 g/dL (ref 3.9–4.9)
Alkaline Phosphatase: 67 IU/L (ref 44–121)
BUN/Creatinine Ratio: 19 (ref 10–24)
BUN: 19 mg/dL (ref 8–27)
Bilirubin Total: 0.3 mg/dL (ref 0.0–1.2)
CO2: 17 mmol/L — ABNORMAL LOW (ref 20–29)
Calcium: 9.1 mg/dL (ref 8.6–10.2)
Chloride: 103 mmol/L (ref 96–106)
Creatinine, Ser: 1.02 mg/dL (ref 0.76–1.27)
Globulin, Total: 2.6 g/dL (ref 1.5–4.5)
Glucose: 89 mg/dL (ref 70–99)
Potassium: 4.4 mmol/L (ref 3.5–5.2)
Sodium: 139 mmol/L (ref 134–144)
Total Protein: 7.1 g/dL (ref 6.0–8.5)
eGFR: 81 mL/min/{1.73_m2} (ref >59)

## 2022-09-12 LAB — CBC WITH DIFFERENTIAL/PLATELET
Basophils Absolute: 0.1 10*3/uL (ref 0.0–0.2)
Basos: 1 %
EOS (ABSOLUTE): 0.2 10*3/uL (ref 0.0–0.4)
Eos: 2 %
Hematocrit: 47.6 % (ref 37.5–51.0)
Hemoglobin: 16.2 g/dL (ref 13.0–17.7)
Immature Grans (Abs): 0.1 10*3/uL (ref 0.0–0.1)
Immature Granulocytes: 1 %
Lymphocytes Absolute: 2.7 10*3/uL (ref 0.7–3.1)
Lymphs: 35 %
MCH: 28.7 pg (ref 26.6–33.0)
MCHC: 34 g/dL (ref 31.5–35.7)
MCV: 84 fL (ref 79–97)
Monocytes Absolute: 0.7 10*3/uL (ref 0.1–0.9)
Monocytes: 9 %
Neutrophils Absolute: 4.2 10*3/uL (ref 1.4–7.0)
Neutrophils: 52 %
Platelets: 225 10*3/uL (ref 150–450)
RBC: 5.65 x10E6/uL (ref 4.14–5.80)
RDW: 12.1 % (ref 11.6–15.4)
WBC: 7.9 10*3/uL (ref 3.4–10.8)

## 2022-09-12 LAB — LIPID PANEL
Chol/HDL Ratio: 3.9 ratio (ref 0.0–5.0)
Cholesterol, Total: 149 mg/dL (ref 100–199)
HDL: 38 mg/dL — ABNORMAL LOW (ref >39)
LDL Chol Calc (NIH): 79 mg/dL (ref 0–99)
Triglycerides: 189 mg/dL — ABNORMAL HIGH (ref 0–149)
VLDL Cholesterol Cal: 32 mg/dL (ref 5–40)

## 2022-09-12 LAB — TSH: TSH: 1.96 u[IU]/mL (ref 0.450–4.500)

## 2022-09-12 LAB — VITAMIN B12: Vitamin B-12: 444 pg/mL (ref 232–1245)

## 2022-09-12 NOTE — Progress Notes (Signed)
Patient returning call. Please call back

## 2022-09-15 ENCOUNTER — Other Ambulatory Visit: Payer: Self-pay | Admitting: Family Medicine

## 2022-09-15 DIAGNOSIS — R0602 Shortness of breath: Secondary | ICD-10-CM

## 2022-09-17 DIAGNOSIS — L814 Other melanin hyperpigmentation: Secondary | ICD-10-CM | POA: Diagnosis not present

## 2022-09-17 DIAGNOSIS — L821 Other seborrheic keratosis: Secondary | ICD-10-CM | POA: Diagnosis not present

## 2022-09-17 DIAGNOSIS — D225 Melanocytic nevi of trunk: Secondary | ICD-10-CM | POA: Diagnosis not present

## 2022-09-17 DIAGNOSIS — L219 Seborrheic dermatitis, unspecified: Secondary | ICD-10-CM | POA: Diagnosis not present

## 2022-09-18 ENCOUNTER — Ambulatory Visit: Payer: Medicare HMO | Attending: Family Medicine

## 2022-09-18 ENCOUNTER — Other Ambulatory Visit: Payer: Self-pay

## 2022-09-18 DIAGNOSIS — M5442 Lumbago with sciatica, left side: Secondary | ICD-10-CM | POA: Insufficient documentation

## 2022-09-18 DIAGNOSIS — M6281 Muscle weakness (generalized): Secondary | ICD-10-CM | POA: Insufficient documentation

## 2022-09-18 DIAGNOSIS — G8929 Other chronic pain: Secondary | ICD-10-CM | POA: Insufficient documentation

## 2022-09-18 DIAGNOSIS — M5416 Radiculopathy, lumbar region: Secondary | ICD-10-CM | POA: Diagnosis not present

## 2022-09-18 NOTE — Therapy (Signed)
OUTPATIENT PHYSICAL THERAPY THORACOLUMBAR EVALUATION   Patient Name: James Davenport MRN: [id number] DOB:06/19/1956, 67 y.o., male Today's Date: 09/18/2022  END OF SESSION:  PT End of Session - 09/18/22 0858     Visit Number 1    Number of Visits 12    Date for PT Re-Evaluation 11/29/22    PT Start Time 0903    PT Stop Time 1000    PT Time Calculation (min) 57 min    Activity Tolerance Patient limited by pain    Behavior During Therapy [facility] for tasks assessed/performed             Past Medical History:  Diagnosis Date   AAA (abdominal aortic aneurysm) without rupture ([place]) 09/19/2021   Aortic atherosclerosis ([place])    Arthritis 11/24/2020   Bilateral carpal tunnel syndrome 11/24/2020   Chronic left shoulder pain 11/24/2020   GERD (gastroesophageal reflux disease) 11/24/2020   Hyperlipidemia LDL goal <100 01/31/2016   Hypertension    Leg cramps 11/24/2020   Past Surgical History:  Procedure Laterality Date   LEG SURGERY Right 1975   Patient Active Problem List   Diagnosis Date Noted   Bee sting allergy 01/23/2022   AAA (abdominal aortic aneurysm) without rupture ([place]) 09/19/2021   Shortness of breath 08/13/2021   Seasonal allergies 01/09/2021   Seborrheic dermatitis 01/09/2021   History of colon polyps 01/09/2021   Aortic atherosclerosis ([facility])    GERD (gastroesophageal reflux disease) 11/24/2020   Arthritis 11/24/2020   Leg cramps 11/24/2020   Bilateral carpal tunnel syndrome 11/24/2020   Chronic left shoulder pain 11/24/2020   Hyperlipidemia LDL goal <100 01/31/2016   REFERRING PROVIDER: Gwenlyn Perking, FNP   REFERRING DIAG: Chronic midline low back pain with left-sided sciatica   Rationale for Evaluation and Treatment: Rehabilitation  THERAPY DIAG:  Radiculopathy, lumbar region  Muscle weakness (generalized)  ONSET DATE: about 8 years ago   SUBJECTIVE:                                                                                                                                                                                            SUBJECTIVE STATEMENT: Patient reports that his back has been bothering him for about 8 years and has gradually been getting worse. He notes that he can only walk for about 15 minutes prior to having to stop due to pain and numbness in both legs. He notes that his left leg starts bothering him after walking for about 5 minutes, but he can "tough it out for another 10 minutes." He has noticed intermittent numbness since he developed sepsis during Christmas of 2017. He notes  that he will occasionally need help getting his shoes and socks on in the morning due to his pain.   PERTINENT HISTORY:  Abdominal aortic aneurysm, carpal tunnel, arthritis, and hypertension  PAIN:  Are you having pain? Yes: NPRS scale: 10/10 Pain location: low back radiating down both legs Pain description: numbness (intermittent),  Aggravating factors: standing or walking (15 minutes at most), lifting, bending over,  Relieving factors: medication, rest  PRECAUTIONS: None  WEIGHT BEARING RESTRICTIONS: No  FALLS:  Has patient fallen in last 6 months? No and he has to be more cautious as he feels unsteady  LIVING ENVIRONMENT: Lives with: lives with their spouse Lives in: House/apartment Stairs: Yes: External: 4 steps; can reach both; pattern depends on is pain level Has following equipment at home: None  OCCUPATION: retired  PLOF: Independent  PATIENT GOALS: reduced pain, improved mobility, and resolve his numbness  NEXT MD VISIT: about 6 weeks  OBJECTIVE:   DIAGNOSTIC FINDINGS: 09/11/22 Lumbar x-ray IMPRESSION: Mild degenerative joint changes of lower lumbar spine.  SCREENING FOR RED FLAGS: Bowel or bladder incontinence: No Spinal tumors: No Cauda equina syndrome: No Compression fracture: No Abdominal aneurysm: No  COGNITION: Overall cognitive status: Within functional limits for tasks  assessed     SENSATION: Patient reports left lateral thigh numbness; diminished sensation throughout LLE with absent sensation at L2-4 and S1 dermatomes   POSTURE: rounded shoulders, forward head, and flexed trunk   PALPATION: TTP: L2-S1 spinous process  LUMBAR JOINT MOBILITY:   Hypomobile throughout with L2-S1 being painful   LUMBAR ROM:   AROM eval  Flexion 32; limited by pain  Extension 14; limited by pain  Right lateral flexion 50% limited; "feels good"  Left lateral flexion 50% limited; limited by pain  Right rotation 75% limited; limited by pain  Left rotation 50% limited; limited by pain   (Blank rows = not tested)  LOWER EXTREMITY ROM: WFL for activities assessed  LOWER EXTREMITY MMT:    MMT Right eval Left eval  Hip flexion 4-/5; discomfort 3/5; familiar pain  Hip extension    Hip abduction    Hip adduction    Hip internal rotation    Hip external rotation    Knee flexion 4/5; slight pain (L>R) 4-/5; slight pain (L>R)  Knee extension 4-/5 3+/5; discomfort  Ankle dorsiflexion 3+/5 3/5  Ankle plantarflexion    Ankle inversion    Ankle eversion     (Blank rows = not tested)  FUNCTIONAL TESTS:  Bed mobility: slow and painful  GAIT: Assistive device utilized: None Level of assistance: Complete Independence Comments: decreased gait speed and stride length  TODAY'S TREATMENT:                                                                                                                              DATE:     PATIENT EDUCATION:  Education details: Plan of care, healing, prognosis, and goals for therapy Person educated: Patient  Education method: Explanation Education comprehension: verbalized understanding  HOME EXERCISE PROGRAM:   ASSESSMENT:  CLINICAL IMPRESSION: Patient is a 66 y.o. male who was seen today for physical therapy evaluation and treatment for chronic low back pain radiating down both lower extremities.  He presented with high pain  severity and irritability with lumbar active range of motion and lower extremity manual muscle testing being the most aggravating for his familiar symptoms.  Recommend that he continue with skilled physical therapy to address his impairments to maximize his functional mobility and safety.  OBJECTIVE IMPAIRMENTS: Abnormal gait, decreased activity tolerance, decreased mobility, difficulty walking, decreased ROM, decreased strength, hypomobility, impaired sensation, postural dysfunction, and pain.   ACTIVITY LIMITATIONS: carrying, lifting, bending, standing, squatting, sleeping, stairs, transfers, bed mobility, dressing, and locomotion level  PARTICIPATION LIMITATIONS: meal prep, cleaning, laundry, shopping, community activity, and yard work  PERSONAL FACTORS: Time since onset of injury/illness/exacerbation and 3+ comorbidities: Abdominal aortic aneurysm, carpal tunnel, arthritis, and hypertension  are also affecting patient's functional outcome.   REHAB POTENTIAL: Fair    CLINICAL DECISION MAKING: Unstable/unpredictable  EVALUATION COMPLEXITY: High   GOALS: Goals reviewed with patient? Yes  SHORT TERM GOALS: Target date: 10/09/22  Patient will be independent with his initial HEP. Baseline: Goal status: INITIAL  2.  Patient will be able to stand and walk for at least 20 minutes without being limited by his familiar low back symptoms. Baseline:  Goal status: INITIAL  3.  Patient will be able to complete his daily activities without his familiar pain exceeding 8/10. Baseline:  Goal status: INITIAL  LONG TERM GOALS: Target date: 10/30/22  Patient will be independent with his advanced HEP. Baseline:  Goal status: INITIAL  2.  Patient will be able to walk for at least 30 minutes without being limited by his familiar low back symptoms. Baseline:  Goal status: INITIAL  3.  Patient will be able to complete his daily activities without his familiar low back pain exceeding  6/10. Baseline:  Goal status: INITIAL  4.  Patient will be able to transfer from supine to sitting with minimal to no low back pain. Baseline:  Goal status: INITIAL  PLAN:  PT FREQUENCY: 1-2x/week  PT DURATION: 6 weeks  PLANNED INTERVENTIONS: Therapeutic exercises, Therapeutic activity, Neuromuscular re-education, Balance training, Gait training, Patient/Family education, Self Care, Joint mobilization, Stair training, Dry Needling, Electrical stimulation, Spinal mobilization, Cryotherapy, Moist heat, Traction, Manual therapy, and Re-evaluation.  PLAN FOR NEXT SESSION: NuStep, lower extremity strengthening, log rolling, lifting mechanics, manual therapy, modalities as needed   Darlin Coco, PT 09/18/2022, 1:55 PM

## 2022-09-19 ENCOUNTER — Ambulatory Visit: Payer: Medicare HMO

## 2022-09-19 DIAGNOSIS — M5442 Lumbago with sciatica, left side: Secondary | ICD-10-CM | POA: Diagnosis not present

## 2022-09-19 DIAGNOSIS — M6281 Muscle weakness (generalized): Secondary | ICD-10-CM

## 2022-09-19 DIAGNOSIS — M5416 Radiculopathy, lumbar region: Secondary | ICD-10-CM

## 2022-09-19 DIAGNOSIS — G8929 Other chronic pain: Secondary | ICD-10-CM | POA: Diagnosis not present

## 2022-09-19 NOTE — Therapy (Signed)
OUTPATIENT PHYSICAL THERAPY THORACOLUMBAR EVALUATION   Patient Name: James Davenport MRN: [id number] DOB:10-08-1955, 66 y.o., male Today's Date: 09/19/2022  END OF SESSION:  PT End of Session - 09/19/22 0819     Visit Number 2    Number of Visits 12    Date for PT Re-Evaluation 11/29/22    PT Start Time 0815    PT Stop Time 0900    PT Time Calculation (min) 45 min    Activity Tolerance Patient tolerated treatment well    Behavior During Therapy [facility] for tasks assessed/performed              Past Medical History:  Diagnosis Date   AAA (abdominal aortic aneurysm) without rupture ([place]) 09/19/2021   Aortic atherosclerosis ([place])    Arthritis 11/24/2020   Bilateral carpal tunnel syndrome 11/24/2020   Chronic left shoulder pain 11/24/2020   GERD (gastroesophageal reflux disease) 11/24/2020   Hyperlipidemia LDL goal <100 01/31/2016   Hypertension    Leg cramps 11/24/2020   Past Surgical History:  Procedure Laterality Date   LEG SURGERY Right 1975   Patient Active Problem List   Diagnosis Date Noted   Bee sting allergy 01/23/2022   AAA (abdominal aortic aneurysm) without rupture ([place] Davenport) 09/19/2021   Shortness of breath 08/13/2021   Seasonal allergies 01/09/2021   Seborrheic dermatitis 01/09/2021   History of colon polyps 01/09/2021   Aortic atherosclerosis ([facility])    GERD (gastroesophageal reflux disease) 11/24/2020   Arthritis 11/24/2020   Leg cramps 11/24/2020   Bilateral carpal tunnel syndrome 11/24/2020   Chronic left shoulder pain 11/24/2020   Hyperlipidemia LDL goal <100 01/31/2016   REFERRING PROVIDER: Gwenlyn Perking, FNP   REFERRING DIAG: Chronic midline low back pain with left-sided sciatica   Rationale for Evaluation and Treatment: Rehabilitation  THERAPY DIAG:  Radiculopathy, lumbar region  Muscle weakness (generalized)  ONSET DATE: about 8 years ago   SUBJECTIVE:                                                                                                                                                                                            SUBJECTIVE STATEMENT: Patient reports that today is a good morning and he is not hurting any.   PERTINENT HISTORY:  Abdominal aortic aneurysm, carpal tunnel, arthritis, and hypertension  PAIN:  Are you having pain? Yes: NPRS scale: 0/10 Pain location: low back radiating down both legs Pain description: numbness (intermittent),  Aggravating factors: standing or walking (15 minutes at most), lifting, bending over,  Relieving factors: medication, rest  PRECAUTIONS: None  WEIGHT BEARING RESTRICTIONS: No  FALLS:  Has patient fallen  in last 6 months? No and he has to be more cautious as he feels unsteady  LIVING ENVIRONMENT: Lives with: lives with their spouse Lives in: House/apartment Stairs: Yes: External: 4 steps; can reach both; pattern depends on is pain level Has following equipment at home: None  OCCUPATION: retired  PLOF: Independent  PATIENT GOALS: reduced pain, improved mobility, and resolve his numbness  NEXT MD VISIT: about 6 weeks  OBJECTIVE:   DIAGNOSTIC FINDINGS: 09/11/22 Lumbar x-ray IMPRESSION: Mild degenerative joint changes of lower lumbar spine.  SCREENING FOR RED FLAGS: Bowel or bladder incontinence: No Spinal tumors: No Cauda equina syndrome: No Compression fracture: No Abdominal aneurysm: No  COGNITION: Overall cognitive status: Within functional limits for tasks assessed     SENSATION: Patient reports left lateral thigh numbness; diminished sensation throughout LLE with absent sensation at L2-4 and S1 dermatomes   POSTURE: rounded shoulders, forward head, and flexed trunk   PALPATION: TTP: L2-S1 spinous process  LUMBAR JOINT MOBILITY:   Hypomobile throughout with L2-S1 being painful   LUMBAR ROM:   AROM eval  Flexion 32; limited by pain  Extension 14; limited by pain  Right lateral flexion 50% limited; "feels good"  Left  lateral flexion 50% limited; limited by pain  Right rotation 75% limited; limited by pain  Left rotation 50% limited; limited by pain   (Blank rows = not tested)  LOWER EXTREMITY ROM: WFL for activities assessed  LOWER EXTREMITY MMT:    MMT Right eval Left eval  Hip flexion 4-/5; discomfort 3/5; familiar pain  Hip extension    Hip abduction    Hip adduction    Hip internal rotation    Hip external rotation    Knee flexion 4/5; slight pain (L>R) 4-/5; slight pain (L>R)  Knee extension 4-/5 3+/5; discomfort  Ankle dorsiflexion 3+/5 3/5  Ankle plantarflexion    Ankle inversion    Ankle eversion     (Blank rows = not tested)  FUNCTIONAL TESTS:  Bed mobility: slow and painful  GAIT: Assistive device utilized: None Level of assistance: Complete Independence Comments: decreased gait speed and stride length  TODAY'S TREATMENT:                                                                                                                              DATE:                                     12/14 EXERCISE LOG  Exercise Repetitions and Resistance Comments  Nustep  L3 x 15 minutes    Isometric ball press 2 minutes w/ 5 second hold   Ball roll out  20 reps   Gastroc stretch  3 x 30 seconds   Hamstring stretch  2 x 30 seconds   Wall squat 30 reps   Bridge 5 reps  Limited by familiar pain  Blank cell = exercise not performed today  Manual Therapy Manual Traction: Lumbar, attempted, but held due to increased pain    PATIENT EDUCATION:  Education details: Plan of care, healing, prognosis, and goals for therapy Person educated: Patient Education method: Explanation Education comprehension: verbalized understanding  HOME EXERCISE PROGRAM:   ASSESSMENT:  CLINICAL IMPRESSION: Patient was introduced to multiple new interventions for improved lumbar stability and lower extremity strength.  He required minimal cueing with today's new interventions for proper exercise  performance to promote increased strength without aggravating his familiar symptoms.  He experienced increased low back pain with activities such as bridges which resulted in his activities being modified.  Manual traction was attempted, but stopped due to increased low back pain with this intervention.  He reported that his back felt alright upon the conclusion of treatment.  He continues to require skilled physical therapy to address his remaining impairments to maximize his functional mobility.  OBJECTIVE IMPAIRMENTS: Abnormal gait, decreased activity tolerance, decreased mobility, difficulty walking, decreased ROM, decreased strength, hypomobility, impaired sensation, postural dysfunction, and pain.   ACTIVITY LIMITATIONS: carrying, lifting, bending, standing, squatting, sleeping, stairs, transfers, bed mobility, dressing, and locomotion level  PARTICIPATION LIMITATIONS: meal prep, cleaning, laundry, shopping, community activity, and yard work  PERSONAL FACTORS: Time since onset of injury/illness/exacerbation and 3+ comorbidities: Abdominal aortic aneurysm, carpal tunnel, arthritis, and hypertension  are also affecting patient's functional outcome.   REHAB POTENTIAL: Fair    CLINICAL DECISION MAKING: Unstable/unpredictable  EVALUATION COMPLEXITY: High   GOALS: Goals reviewed with patient? Yes  SHORT TERM GOALS: Target date: 10/09/22  Patient will be independent with his initial HEP. Baseline: Goal status: INITIAL  2.  Patient will be able to stand and walk for at least 20 minutes without being limited by his familiar low back symptoms. Baseline:  Goal status: INITIAL  3.  Patient will be able to complete his daily activities without his familiar pain exceeding 8/10. Baseline:  Goal status: INITIAL  LONG TERM GOALS: Target date: 10/30/22  Patient will be independent with his advanced HEP. Baseline:  Goal status: INITIAL  2.  Patient will be able to walk for at least 30 minutes  without being limited by his familiar low back symptoms. Baseline:  Goal status: INITIAL  3.  Patient will be able to complete his daily activities without his familiar low back pain exceeding 6/10. Baseline:  Goal status: INITIAL  4.  Patient will be able to transfer from supine to sitting with minimal to no low back pain. Baseline:  Goal status: INITIAL  PLAN:  PT FREQUENCY: 1-2x/week  PT DURATION: 6 weeks  PLANNED INTERVENTIONS: Therapeutic exercises, Therapeutic activity, Neuromuscular re-education, Balance training, Gait training, Patient/Family education, Self Care, Joint mobilization, Stair training, Dry Needling, Electrical stimulation, Spinal mobilization, Cryotherapy, Moist heat, Traction, Manual therapy, and Re-evaluation.  PLAN FOR NEXT SESSION: NuStep, lower extremity strengthening, log rolling, lifting mechanics, manual therapy, modalities as needed   Darlin Coco, PT 09/19/2022, 11:44 AM

## 2022-09-24 ENCOUNTER — Ambulatory Visit: Payer: Medicare HMO

## 2022-09-24 DIAGNOSIS — M5416 Radiculopathy, lumbar region: Secondary | ICD-10-CM | POA: Diagnosis not present

## 2022-09-24 DIAGNOSIS — G8929 Other chronic pain: Secondary | ICD-10-CM | POA: Diagnosis not present

## 2022-09-24 DIAGNOSIS — M6281 Muscle weakness (generalized): Secondary | ICD-10-CM

## 2022-09-24 DIAGNOSIS — M5442 Lumbago with sciatica, left side: Secondary | ICD-10-CM | POA: Diagnosis not present

## 2022-09-24 NOTE — Therapy (Signed)
OUTPATIENT PHYSICAL THERAPY THORACOLUMBAR TREATMENT   Patient Name: James Davenport MRN: [id number] DOB:29-Aug-1956, 66 y.o., male Today's Date: 09/24/2022  END OF SESSION:  PT End of Session - 09/24/22 0811     Visit Number 3    Number of Visits 12    Date for PT Re-Evaluation 11/29/22    PT Start Time 0815    PT Stop Time 0900    PT Time Calculation (min) 45 min    Activity Tolerance Patient tolerated treatment well    Behavior During Therapy [facility] for tasks assessed/performed              Past Medical History:  Diagnosis Date   AAA (abdominal aortic aneurysm) without rupture ([place]) 09/19/2021   Aortic atherosclerosis ([place])    Arthritis 11/24/2020   Bilateral carpal tunnel syndrome 11/24/2020   Chronic left shoulder pain 11/24/2020   GERD (gastroesophageal reflux disease) 11/24/2020   Hyperlipidemia LDL goal <100 01/31/2016   Hypertension    Leg cramps 11/24/2020   Past Surgical History:  Procedure Laterality Date   LEG SURGERY Right 1975   Patient Active Problem List   Diagnosis Date Noted   Bee sting allergy 01/23/2022   AAA (abdominal aortic aneurysm) without rupture ([facility]) 09/19/2021   Shortness of breath 08/13/2021   Seasonal allergies 01/09/2021   Seborrheic dermatitis 01/09/2021   History of colon polyps 01/09/2021   Aortic atherosclerosis ([facility])    GERD (gastroesophageal reflux disease) 11/24/2020   Arthritis 11/24/2020   Leg cramps 11/24/2020   Bilateral carpal tunnel syndrome 11/24/2020   Chronic left shoulder pain 11/24/2020   Hyperlipidemia LDL goal <100 01/31/2016   REFERRING PROVIDER: Gwenlyn Perking, FNP   REFERRING DIAG: Chronic midline low back pain with left-sided sciatica   Rationale for Evaluation and Treatment: Rehabilitation  THERAPY DIAG:  Radiculopathy, lumbar region  Muscle weakness (generalized)  ONSET DATE: about 8 years ago   SUBJECTIVE:                                                                                                                                                                                            SUBJECTIVE STATEMENT: Patient reports that he was a little sore for about a day and a half after his last appointment. He notes that his back has "caught" about 3 times since his last appointment, but it feels like it is getting better.   PERTINENT HISTORY:  Abdominal aortic aneurysm, carpal tunnel, arthritis, and hypertension  PAIN:  Are you having pain? Yes: NPRS scale: 0/10 Pain location: low back radiating down both legs Pain description: numbness (intermittent),  Aggravating factors: standing or  walking (15 minutes at most), lifting, bending over,  Relieving factors: medication, rest  PRECAUTIONS: None  WEIGHT BEARING RESTRICTIONS: No  FALLS:  Has patient fallen in last 6 months? No and he has to be more cautious as he feels unsteady  LIVING ENVIRONMENT: Lives with: lives with their spouse Lives in: House/apartment Stairs: Yes: External: 4 steps; can reach both; pattern depends on is pain level Has following equipment at home: None  OCCUPATION: retired  PLOF: Independent  PATIENT GOALS: reduced pain, improved mobility, and resolve his numbness  NEXT MD VISIT: about 6 weeks  OBJECTIVE: all objective information was assessed at his initial evaluation on 09/18/22 unless otherwise noted  DIAGNOSTIC FINDINGS: 09/11/22 Lumbar x-ray IMPRESSION: Mild degenerative joint changes of lower lumbar spine.  SCREENING FOR RED FLAGS: Bowel or bladder incontinence: No Spinal tumors: No Cauda equina syndrome: No Compression fracture: No Abdominal aneurysm: No  COGNITION: Overall cognitive status: Within functional limits for tasks assessed     SENSATION: Patient reports left lateral thigh numbness; diminished sensation throughout LLE with absent sensation at L2-4 and S1 dermatomes   POSTURE: rounded shoulders, forward head, and flexed trunk   PALPATION: TTP: L2-S1  spinous process  LUMBAR JOINT MOBILITY:   Hypomobile throughout with L2-S1 being painful   LUMBAR ROM:   AROM eval  Flexion 32; limited by pain  Extension 14; limited by pain  Right lateral flexion 50% limited; "feels good"  Left lateral flexion 50% limited; limited by pain  Right rotation 75% limited; limited by pain  Left rotation 50% limited; limited by pain   (Blank rows = not tested)  LOWER EXTREMITY ROM: WFL for activities assessed  LOWER EXTREMITY MMT:    MMT Right eval Left eval  Hip flexion 4-/5; discomfort 3/5; familiar pain  Hip extension    Hip abduction    Hip adduction    Hip internal rotation    Hip external rotation    Knee flexion 4/5; slight pain (L>R) 4-/5; slight pain (L>R)  Knee extension 4-/5 3+/5; discomfort  Ankle dorsiflexion 3+/5 3/5  Ankle plantarflexion    Ankle inversion    Ankle eversion     (Blank rows = not tested)  FUNCTIONAL TESTS:  Bed mobility: slow and painful  GAIT: Assistive device utilized: None Level of assistance: Complete Independence Comments: decreased gait speed and stride length  TODAY'S TREATMENT:                                                                                                                              DATE:                                     12/19 EXERCISE LOG  Exercise Repetitions and Resistance Comments  Nustep  L4 x 15 minutes   Resisted pull down  Blue XTS x 2 minutes   Gastroc stretch  4 x 30 seconds   Isometric lumbar extension 3 minutes w/ 3 second hold   Isometric ball press w/ lumbar rotation 3 x 10 reps each w/ 5 second hold   Tandem balance on foam 4 x 30 seconds each    Slouch overcorrect  20 reps   Self STM with tennis ball     Blank cell = exercise not performed today                                    12/14 EXERCISE LOG  Exercise Repetitions and Resistance Comments  Nustep  L3 x 15 minutes    Isometric ball press 2 minutes w/ 5 second hold   Ball roll out  20 reps    Gastroc stretch  3 x 30 seconds   Hamstring stretch  2 x 30 seconds   Wall squat 30 reps   Bridge 5 reps  Limited by familiar pain   Blank cell = exercise not performed today  Manual Therapy Manual Traction: Lumbar, attempted, but held due to increased pain    PATIENT EDUCATION:  Education details: Plan of care, healing, prognosis, and goals for therapy Person educated: Patient Education method: Explanation Education comprehension: verbalized understanding  HOME EXERCISE PROGRAM:   ASSESSMENT:  CLINICAL IMPRESSION: Patient was introduced to multiple new interventions for improved lumbar mobility and stability with moderate difficulty. He required minimal cueing with today's new interventions for proper exercise performance. He experienced a mild increase in his familiar low back discomfort with the isometric ball press with left rotation, but this did not limit his ability to complete this or any of today's other interventions. He reported feeling tired upon the conclusion of treatment. He continues to require skilled physical therapy to address his remaining impairments to return to his prior level of function.  OBJECTIVE IMPAIRMENTS: Abnormal gait, decreased activity tolerance, decreased mobility, difficulty walking, decreased ROM, decreased strength, hypomobility, impaired sensation, postural dysfunction, and pain.   ACTIVITY LIMITATIONS: carrying, lifting, bending, standing, squatting, sleeping, stairs, transfers, bed mobility, dressing, and locomotion level  PARTICIPATION LIMITATIONS: meal prep, cleaning, laundry, shopping, community activity, and yard work  PERSONAL FACTORS: Time since onset of injury/illness/exacerbation and 3+ comorbidities: Abdominal aortic aneurysm, carpal tunnel, arthritis, and hypertension  are also affecting patient's functional outcome.   REHAB POTENTIAL: Fair    CLINICAL DECISION MAKING: Unstable/unpredictable  EVALUATION COMPLEXITY:  High   GOALS: Goals reviewed with patient? Yes  SHORT TERM GOALS: Target date: 10/09/22  Patient will be independent with his initial HEP. Baseline: Goal status: INITIAL  2.  Patient will be able to stand and walk for at least 20 minutes without being limited by his familiar low back symptoms. Baseline:  Goal status: INITIAL  3.  Patient will be able to complete his daily activities without his familiar pain exceeding 8/10. Baseline:  Goal status: INITIAL  LONG TERM GOALS: Target date: 10/30/22  Patient will be independent with his advanced HEP. Baseline:  Goal status: INITIAL  2.  Patient will be able to walk for at least 30 minutes without being limited by his familiar low back symptoms. Baseline:  Goal status: INITIAL  3.  Patient will be able to complete his daily activities without his familiar low back pain exceeding 6/10. Baseline:  Goal status: INITIAL  4.  Patient will be able to transfer from supine to sitting with minimal to no low back pain. Baseline:  Goal status: INITIAL  PLAN:  PT FREQUENCY: 1-2x/week  PT DURATION: 6 weeks  PLANNED INTERVENTIONS: Therapeutic exercises, Therapeutic activity, Neuromuscular re-education, Balance training, Gait training, Patient/Family education, Self Care, Joint mobilization, Stair training, Dry Needling, Electrical stimulation, Spinal mobilization, Cryotherapy, Moist heat, Traction, Manual therapy, and Re-evaluation.  PLAN FOR NEXT SESSION: NuStep, lower extremity strengthening, log rolling, lifting mechanics, manual therapy, modalities as needed   Darlin Coco, PT 09/24/2022, 12:36 PM

## 2022-09-26 ENCOUNTER — Ambulatory Visit: Payer: Medicare HMO

## 2022-09-26 DIAGNOSIS — G8929 Other chronic pain: Secondary | ICD-10-CM | POA: Diagnosis not present

## 2022-09-26 DIAGNOSIS — M5416 Radiculopathy, lumbar region: Secondary | ICD-10-CM

## 2022-09-26 DIAGNOSIS — M6281 Muscle weakness (generalized): Secondary | ICD-10-CM | POA: Diagnosis not present

## 2022-09-26 DIAGNOSIS — M5442 Lumbago with sciatica, left side: Secondary | ICD-10-CM | POA: Diagnosis not present

## 2022-09-26 NOTE — Therapy (Signed)
OUTPATIENT PHYSICAL THERAPY THORACOLUMBAR TREATMENT   Patient Name: James Davenport MRN: [id number] DOB:01-Mar-1956, 66 y.o., male Today's Date: 09/26/2022  END OF SESSION:  PT End of Session - 09/26/22 0825     Visit Number 4    Number of Visits 12    Date for PT Re-Evaluation 11/29/22    PT Start Time 0815    Activity Tolerance Patient tolerated treatment well    Behavior During Therapy [facility] for tasks assessed/performed              Past Medical History:  Diagnosis Date   AAA (abdominal aortic aneurysm) without rupture ([place]) 09/19/2021   Aortic atherosclerosis ([place])    Arthritis 11/24/2020   Bilateral carpal tunnel syndrome 11/24/2020   Chronic left shoulder pain 11/24/2020   GERD (gastroesophageal reflux disease) 11/24/2020   Hyperlipidemia LDL goal <100 01/31/2016   Hypertension    Leg cramps 11/24/2020   Past Surgical History:  Procedure Laterality Date   LEG SURGERY Right 1975   Patient Active Problem List   Diagnosis Date Noted   Bee sting allergy 01/23/2022   AAA (abdominal aortic aneurysm) without rupture ([place]) 09/19/2021   Shortness of breath 08/13/2021   Seasonal allergies 01/09/2021   Seborrheic dermatitis 01/09/2021   History of colon polyps 01/09/2021   Aortic atherosclerosis ([facility])    GERD (gastroesophageal reflux disease) 11/24/2020   Arthritis 11/24/2020   Leg cramps 11/24/2020   Bilateral carpal tunnel syndrome 11/24/2020   Chronic left shoulder pain 11/24/2020   Hyperlipidemia LDL goal <100 01/31/2016   REFERRING PROVIDER: Gwenlyn Perking, FNP   REFERRING DIAG: Chronic midline low back pain with left-sided sciatica   Rationale for Evaluation and Treatment: Rehabilitation  THERAPY DIAG:  Radiculopathy, lumbar region  Muscle weakness (generalized)  ONSET DATE: about 8 years ago   SUBJECTIVE:                                                                                                                                                                                            SUBJECTIVE STATEMENT: Pt reports mild soreness in low back   PERTINENT HISTORY:  Abdominal aortic aneurysm, carpal tunnel, arthritis, and hypertension  PAIN:  Are you having pain? Yes: NPRS scale: 0/10 Pain location: low back radiating down both legs Pain description: numbness (intermittent),  Aggravating factors: standing or walking (15 minutes at most), lifting, bending over,  Relieving factors: medication, rest  PRECAUTIONS: None  WEIGHT BEARING RESTRICTIONS: No  FALLS:  Has patient fallen in last 6 months? No and he has to be more cautious as he feels unsteady  LIVING ENVIRONMENT: Lives with: lives with  their spouse Lives in: House/apartment Stairs: Yes: External: 4 steps; can reach both; pattern depends on is pain level Has following equipment at home: None  OCCUPATION: retired  PLOF: Independent  PATIENT GOALS: reduced pain, improved mobility, and resolve his numbness  NEXT MD VISIT: about 6 weeks  OBJECTIVE: all objective information was assessed at his initial evaluation on 09/18/22 unless otherwise noted  DIAGNOSTIC FINDINGS: 09/11/22 Lumbar x-ray IMPRESSION: Mild degenerative joint changes of lower lumbar spine.  SCREENING FOR RED FLAGS: Bowel or bladder incontinence: No Spinal tumors: No Cauda equina syndrome: No Compression fracture: No Abdominal aneurysm: No  COGNITION: Overall cognitive status: Within functional limits for tasks assessed     SENSATION: Patient reports left lateral thigh numbness; diminished sensation throughout LLE with absent sensation at L2-4 and S1 dermatomes   POSTURE: rounded shoulders, forward head, and flexed trunk   PALPATION: TTP: L2-S1 spinous process  LUMBAR JOINT MOBILITY:   Hypomobile throughout with L2-S1 being painful   LUMBAR ROM:   AROM eval  Flexion 32; limited by pain  Extension 14; limited by pain  Right lateral flexion 50% limited; "feels good"  Left  lateral flexion 50% limited; limited by pain  Right rotation 75% limited; limited by pain  Left rotation 50% limited; limited by pain   (Blank rows = not tested)  LOWER EXTREMITY ROM: WFL for activities assessed  LOWER EXTREMITY MMT:    MMT Right eval Left eval  Hip flexion 4-/5; discomfort 3/5; familiar pain  Hip extension    Hip abduction    Hip adduction    Hip internal rotation    Hip external rotation    Knee flexion 4/5; slight pain (L>R) 4-/5; slight pain (L>R)  Knee extension 4-/5 3+/5; discomfort  Ankle dorsiflexion 3+/5 3/5  Ankle plantarflexion    Ankle inversion    Ankle eversion     (Blank rows = not tested)  FUNCTIONAL TESTS:  Bed mobility: slow and painful  GAIT: Assistive device utilized: None Level of assistance: Complete Independence Comments: decreased gait speed and stride length  TODAY'S TREATMENT:                                                                                                                              DATE:                                     12/21 EXERCISE LOG  Exercise Repetitions and Resistance Comments  Nustep  L4 x 15 minutes   Resisted pull down  Blue XTS x 3 minutes   Gastroc stretch  4 x 30 seconds   Isometric lumbar extension    Isometric ball press w/ lumbar rotation 3 x 10 reps each w/ 5 second hold   Tandem balance on foam    Slouch overcorrect  20 reps   Self STM with tennis ball  Blank cell = exercise not performed today                                    12/14 EXERCISE LOG  Exercise Repetitions and Resistance Comments  Nustep  L3 x 15 minutes    Isometric ball press 2 minutes w/ 5 second hold   Ball roll out  20 reps   Gastroc stretch  3 x 30 seconds   Hamstring stretch  2 x 30 seconds   Wall squat 30 reps   Bridge 5 reps  Limited by familiar pain   Blank cell = exercise not performed today  Manual Therapy Soft Tissue Mobilization: right lumbar region, STW/M to right lumbar paraspinals to decrease  pain and tone with pt positioned in left side-lying for comfort with pillow between his knees    Modalities  Date:  Unattended Estim: Lumbar, IFC 80-150 Hz, 15 mins, Pain and Tone  PATIENT EDUCATION:  Education details: Plan of care, healing, prognosis, and goals for therapy Person educated: Patient Education method: Explanation Education comprehension: verbalized understanding  HOME EXERCISE PROGRAM:   ASSESSMENT:  CLINICAL IMPRESSION: Pt arrives for today's treatment session reporting 2/10 low back pain. Pt reports mild soreness after last treatment session with pt attributing to tandem stance on Airex pad.  Pt able to tolerate increased time with XTS pull downs today without issue.  STW/M performed to right lumbar paraspinals to decrease pain and tone with good results. Normal responses to estim noted upon removal.  Pt denied any pain at completion of today's treatment session.  OBJECTIVE IMPAIRMENTS: Abnormal gait, decreased activity tolerance, decreased mobility, difficulty walking, decreased ROM, decreased strength, hypomobility, impaired sensation, postural dysfunction, and pain.   ACTIVITY LIMITATIONS: carrying, lifting, bending, standing, squatting, sleeping, stairs, transfers, bed mobility, dressing, and locomotion level  PARTICIPATION LIMITATIONS: meal prep, cleaning, laundry, shopping, community activity, and yard work  PERSONAL FACTORS: Time since onset of injury/illness/exacerbation and 3+ comorbidities: Abdominal aortic aneurysm, carpal tunnel, arthritis, and hypertension  are also affecting patient's functional outcome.   REHAB POTENTIAL: Fair    CLINICAL DECISION MAKING: Unstable/unpredictable  EVALUATION COMPLEXITY: High   GOALS: Goals reviewed with patient? Yes  SHORT TERM GOALS: Target date: 10/09/22  Patient will be independent with his initial HEP. Baseline: Goal status: INITIAL  2.  Patient will be able to stand and walk for at least 20 minutes  without being limited by his familiar low back symptoms. Baseline:  Goal status: INITIAL  3.  Patient will be able to complete his daily activities without his familiar pain exceeding 8/10. Baseline:  Goal status: INITIAL  LONG TERM GOALS: Target date: 10/30/22  Patient will be independent with his advanced HEP. Baseline:  Goal status: INITIAL  2.  Patient will be able to walk for at least 30 minutes without being limited by his familiar low back symptoms. Baseline:  Goal status: INITIAL  3.  Patient will be able to complete his daily activities without his familiar low back pain exceeding 6/10. Baseline:  Goal status: INITIAL  4.  Patient will be able to transfer from supine to sitting with minimal to no low back pain. Baseline:  Goal status: INITIAL  PLAN:  PT FREQUENCY: 1-2x/week  PT DURATION: 6 weeks  PLANNED INTERVENTIONS: Therapeutic exercises, Therapeutic activity, Neuromuscular re-education, Balance training, Gait training, Patient/Family education, Self Care, Joint mobilization, Stair training, Dry Needling, Electrical stimulation, Spinal mobilization, Cryotherapy, Moist heat,  Traction, Manual therapy, and Re-evaluation.  PLAN FOR NEXT SESSION: NuStep, lower extremity strengthening, log rolling, lifting mechanics, manual therapy, modalities as needed   Kathrynn Ducking, PTA 09/26/2022, 9:50 AM

## 2022-09-27 ENCOUNTER — Encounter: Payer: Self-pay | Admitting: Family Medicine

## 2022-10-02 ENCOUNTER — Ambulatory Visit: Payer: Medicare HMO

## 2022-10-02 DIAGNOSIS — G8929 Other chronic pain: Secondary | ICD-10-CM | POA: Diagnosis not present

## 2022-10-02 DIAGNOSIS — M6281 Muscle weakness (generalized): Secondary | ICD-10-CM

## 2022-10-02 DIAGNOSIS — M5442 Lumbago with sciatica, left side: Secondary | ICD-10-CM | POA: Diagnosis not present

## 2022-10-02 DIAGNOSIS — M5416 Radiculopathy, lumbar region: Secondary | ICD-10-CM | POA: Diagnosis not present

## 2022-10-02 NOTE — Therapy (Signed)
OUTPATIENT PHYSICAL THERAPY THORACOLUMBAR TREATMENT   Patient Name: James Davenport MRN: [id number] DOB:09/25/1956, 66 y.o., male Today's Date: 10/02/2022  END OF SESSION:  PT End of Session - 10/02/22 1000     Visit Number 5    Number of Visits 12    Date for PT Re-Evaluation 11/29/22    PT Start Time 0945    PT Stop Time 6659    PT Time Calculation (min) 47 min    Activity Tolerance Patient tolerated treatment well    Behavior During Therapy [facility] for tasks assessed/performed              Past Medical History:  Diagnosis Date   AAA (abdominal aortic aneurysm) without rupture ([place]) 09/19/2021   Aortic atherosclerosis ([place])    Arthritis 11/24/2020   Bilateral carpal tunnel syndrome 11/24/2020   Chronic left shoulder pain 11/24/2020   GERD (gastroesophageal reflux disease) 11/24/2020   Hyperlipidemia LDL goal <100 01/31/2016   Hypertension    Leg cramps 11/24/2020   Past Surgical History:  Procedure Laterality Date   LEG SURGERY Right 1975   Patient Active Problem List   Diagnosis Date Noted   Bee sting allergy 01/23/2022   AAA (abdominal aortic aneurysm) without rupture ([place]) 09/19/2021   Shortness of breath 08/13/2021   Seasonal allergies 01/09/2021   Seborrheic dermatitis 01/09/2021   History of colon polyps 01/09/2021   Aortic atherosclerosis ([facility])    GERD (gastroesophageal reflux disease) 11/24/2020   Arthritis 11/24/2020   Leg cramps 11/24/2020   Bilateral carpal tunnel syndrome 11/24/2020   Chronic left shoulder pain 11/24/2020   Hyperlipidemia LDL goal <100 01/31/2016   REFERRING PROVIDER: Gwenlyn Perking, FNP   REFERRING DIAG: Chronic midline low back pain with left-sided sciatica   Rationale for Evaluation and Treatment: Rehabilitation  THERAPY DIAG:  Radiculopathy, lumbar region  Muscle weakness (generalized)  ONSET DATE: about 8 years ago   SUBJECTIVE:                                                                                                                                                                                            SUBJECTIVE STATEMENT: Patient reports that his back feels "a constant soreness."   PERTINENT HISTORY:  Abdominal aortic aneurysm, carpal tunnel, arthritis, and hypertension  PAIN:  Are you having pain? Yes: NPRS scale: 2-3/10 Pain location: low back radiating down both legs Pain description: numbness (intermittent),  Aggravating factors: standing or walking (15 minutes at most), lifting, bending over,  Relieving factors: medication, rest  PRECAUTIONS: None  WEIGHT BEARING RESTRICTIONS: No  FALLS:  Has patient fallen in last 6 months? No  and he has to be more cautious as he feels unsteady  LIVING ENVIRONMENT: Lives with: lives with their spouse Lives in: House/apartment Stairs: Yes: External: 4 steps; can reach both; pattern depends on is pain level Has following equipment at home: None  OCCUPATION: retired  PLOF: Independent  PATIENT GOALS: reduced pain, improved mobility, and resolve his numbness  NEXT MD VISIT: about 6 weeks  OBJECTIVE: all objective information was assessed at his initial evaluation on 09/18/22 unless otherwise noted  DIAGNOSTIC FINDINGS: 09/11/22 Lumbar x-ray IMPRESSION: Mild degenerative joint changes of lower lumbar spine.  SCREENING FOR RED FLAGS: Bowel or bladder incontinence: No Spinal tumors: No Cauda equina syndrome: No Compression fracture: No Abdominal aneurysm: No  COGNITION: Overall cognitive status: Within functional limits for tasks assessed     SENSATION: Patient reports left lateral thigh numbness; diminished sensation throughout LLE with absent sensation at L2-4 and S1 dermatomes   POSTURE: rounded shoulders, forward head, and flexed trunk   PALPATION: TTP: L2-S1 spinous process  LUMBAR JOINT MOBILITY:   Hypomobile throughout with L2-S1 being painful   LUMBAR ROM:   AROM eval  Flexion 32; limited by pain   Extension 14; limited by pain  Right lateral flexion 50% limited; "feels good"  Left lateral flexion 50% limited; limited by pain  Right rotation 75% limited; limited by pain  Left rotation 50% limited; limited by pain   (Blank rows = not tested)  LOWER EXTREMITY ROM: WFL for activities assessed  LOWER EXTREMITY MMT:    MMT Right eval Left eval  Hip flexion 4-/5; discomfort 3/5; familiar pain  Hip extension    Hip abduction    Hip adduction    Hip internal rotation    Hip external rotation    Knee flexion 4/5; slight pain (L>R) 4-/5; slight pain (L>R)  Knee extension 4-/5 3+/5; discomfort  Ankle dorsiflexion 3+/5 3/5  Ankle plantarflexion    Ankle inversion    Ankle eversion     (Blank rows = not tested)  FUNCTIONAL TESTS:  Bed mobility: slow and painful  GAIT: Assistive device utilized: None Level of assistance: Complete Independence Comments: decreased gait speed and stride length  TODAY'S TREATMENT:                                                                                                                              DATE:                                     12/27 EXERCISE LOG  Exercise Repetitions and Resistance Comments  Nustep L3 x 16 minutes   Gastroc stretch  4 x 30 seconds   Standing open books 10 reps each   Isometric lumbar extension 3 minutes w/ 5 second hold   Lunges onto BOSU Ball up; 20 reps each    Blank cell = exercise not performed today  Manual Therapy Soft Tissue Mobilization: lumbar paraspinals, for reduced tone Joint Mobilizations: L5-S1, grade I-IV                                     12/21 EXERCISE LOG  Exercise Repetitions and Resistance Comments  Nustep  L4 x 15 minutes   Resisted pull down  Blue XTS x 3 minutes   Gastroc stretch  4 x 30 seconds   Isometric lumbar extension    Isometric ball press w/ lumbar rotation 3 x 10 reps each w/ 5 second hold   Tandem balance on foam    Slouch overcorrect  20 reps   Self STM with  tennis ball     Blank cell = exercise not performed today                                    12/14 EXERCISE LOG  Exercise Repetitions and Resistance Comments  Nustep  L3 x 15 minutes    Isometric ball press 2 minutes w/ 5 second hold   Ball roll out  20 reps   Gastroc stretch  3 x 30 seconds   Hamstring stretch  2 x 30 seconds   Wall squat 30 reps   Bridge 5 reps  Limited by familiar pain   Blank cell = exercise not performed today  Manual Therapy Soft Tissue Mobilization: right lumbar region, STW/M to right lumbar paraspinals to decrease pain and tone with pt positioned in left side-lying for comfort with pillow between his knees    Modalities  Date:  Unattended Estim: Lumbar, IFC 80-150 Hz, 15 mins, Pain and Tone  PATIENT EDUCATION:  Education details: Plan of care, healing, prognosis, and goals for therapy Person educated: Patient Education method: Explanation Education comprehension: verbalized understanding  HOME EXERCISE PROGRAM:   ASSESSMENT:  CLINICAL IMPRESSION: Patient presented to treatment reporting increased midline low back "soreness." He was able to be progressed with lunges onto a BOSU for improved lower extremity strength and stability. He required minimal cueing with today's interventions for proper exercise performance. Lumbar joint mobilizations were the most effective at reducing his his familiar symptoms as he reported that he had improved feeling in his leg along with no pain, soreness, or discomfort. He continues to require skilled physical therapy to address his remaining impairments to return to his prior level of function.   OBJECTIVE IMPAIRMENTS: Abnormal gait, decreased activity tolerance, decreased mobility, difficulty walking, decreased ROM, decreased strength, hypomobility, impaired sensation, postural dysfunction, and pain.   ACTIVITY LIMITATIONS: carrying, lifting, bending, standing, squatting, sleeping, stairs, transfers, bed mobility,  dressing, and locomotion level  PARTICIPATION LIMITATIONS: meal prep, cleaning, laundry, shopping, community activity, and yard work  PERSONAL FACTORS: Time since onset of injury/illness/exacerbation and 3+ comorbidities: Abdominal aortic aneurysm, carpal tunnel, arthritis, and hypertension  are also affecting patient's functional outcome.   REHAB POTENTIAL: Fair    CLINICAL DECISION MAKING: Unstable/unpredictable  EVALUATION COMPLEXITY: High   GOALS: Goals reviewed with patient? Yes  SHORT TERM GOALS: Target date: 10/09/22  Patient will be independent with his initial HEP. Baseline: Goal status: INITIAL  2.  Patient will be able to stand and walk for at least 20 minutes without being limited by his familiar low back symptoms. Baseline:  Goal status: INITIAL  3.  Patient will be able to complete his daily activities  without his familiar pain exceeding 8/10. Baseline:  Goal status: INITIAL  LONG TERM GOALS: Target date: 10/30/22  Patient will be independent with his advanced HEP. Baseline:  Goal status: INITIAL  2.  Patient will be able to walk for at least 30 minutes without being limited by his familiar low back symptoms. Baseline:  Goal status: INITIAL  3.  Patient will be able to complete his daily activities without his familiar low back pain exceeding 6/10. Baseline:  Goal status: INITIAL  4.  Patient will be able to transfer from supine to sitting with minimal to no low back pain. Baseline:  Goal status: INITIAL  PLAN:  PT FREQUENCY: 1-2x/week  PT DURATION: 6 weeks  PLANNED INTERVENTIONS: Therapeutic exercises, Therapeutic activity, Neuromuscular re-education, Balance training, Gait training, Patient/Family education, Self Care, Joint mobilization, Stair training, Dry Needling, Electrical stimulation, Spinal mobilization, Cryotherapy, Moist heat, Traction, Manual therapy, and Re-evaluation.  PLAN FOR NEXT SESSION: NuStep, lower extremity strengthening, log  rolling, lifting mechanics, manual therapy, modalities as needed   Darlin Coco, PT 10/02/2022, 10:48 AM

## 2022-10-09 ENCOUNTER — Ambulatory Visit: Payer: Medicare HMO | Attending: Family Medicine

## 2022-10-09 DIAGNOSIS — M6281 Muscle weakness (generalized): Secondary | ICD-10-CM | POA: Diagnosis not present

## 2022-10-09 DIAGNOSIS — M5416 Radiculopathy, lumbar region: Secondary | ICD-10-CM | POA: Insufficient documentation

## 2022-10-09 NOTE — Therapy (Signed)
OUTPATIENT PHYSICAL THERAPY THORACOLUMBAR TREATMENT   Patient Name: James Davenport MRN: [id number] DOB:Sep 07, 1956, 67 y.o., male Today's Date: 10/09/2022  END OF SESSION:  PT End of Session - 10/09/22 0819     Visit Number 6    Number of Visits 12    Date for PT Re-Evaluation 11/29/22    PT Start Time 0815    PT Stop Time 0917    PT Time Calculation (min) 62 min    Activity Tolerance Patient tolerated treatment well    Behavior During Therapy [facility] for tasks assessed/performed              Past Medical History:  Diagnosis Date   AAA (abdominal aortic aneurysm) without rupture ([place]) 09/19/2021   Aortic atherosclerosis ([place])    Arthritis 11/24/2020   Bilateral carpal tunnel syndrome 11/24/2020   Chronic left shoulder pain 11/24/2020   GERD (gastroesophageal reflux disease) 11/24/2020   Hyperlipidemia LDL goal <100 01/31/2016   Hypertension    Leg cramps 11/24/2020   Past Surgical History:  Procedure Laterality Date   LEG SURGERY Right 1975   Patient Active Problem List   Diagnosis Date Noted   Bee sting allergy 01/23/2022   AAA (abdominal aortic aneurysm) without rupture ([place]) 09/19/2021   Shortness of breath 08/13/2021   Seasonal allergies 01/09/2021   Seborrheic dermatitis 01/09/2021   History of colon polyps 01/09/2021   Aortic atherosclerosis ([facility])    GERD (gastroesophageal reflux disease) 11/24/2020   Arthritis 11/24/2020   Leg cramps 11/24/2020   Bilateral carpal tunnel syndrome 11/24/2020   Chronic left shoulder pain 11/24/2020   Hyperlipidemia LDL goal <100 01/31/2016   REFERRING PROVIDER: Gwenlyn Perking, FNP   REFERRING DIAG: Chronic midline low back pain with left-sided sciatica   Rationale for Evaluation and Treatment: Rehabilitation  THERAPY DIAG:  Radiculopathy, lumbar region  Muscle weakness (generalized)  ONSET DATE: about 8 years ago   SUBJECTIVE:                                                                                                                                                                                            SUBJECTIVE STATEMENT: Pt reports that back pain is feeling better.   PERTINENT HISTORY:  Abdominal aortic aneurysm, carpal tunnel, arthritis, and hypertension  PAIN:  Are you having pain? Yes: NPRS scale: 1/10 Pain location: low back radiating down both legs Pain description: numbness (intermittent),  Aggravating factors: standing or walking (15 minutes at most), lifting, bending over,  Relieving factors: medication, rest  PRECAUTIONS: None  WEIGHT BEARING RESTRICTIONS: No  FALLS:  Has patient fallen in last 6 months? No and  he has to be more cautious as he feels unsteady  LIVING ENVIRONMENT: Lives with: lives with their spouse Lives in: House/apartment Stairs: Yes: External: 4 steps; can reach both; pattern depends on is pain level Has following equipment at home: None  OCCUPATION: retired  PLOF: Independent  PATIENT GOALS: reduced pain, improved mobility, and resolve his numbness  NEXT MD VISIT: about 6 weeks  OBJECTIVE: all objective information was assessed at his initial evaluation on 09/18/22 unless otherwise noted  DIAGNOSTIC FINDINGS: 09/11/22 Lumbar x-ray IMPRESSION: Mild degenerative joint changes of lower lumbar spine.  SCREENING FOR RED FLAGS: Bowel or bladder incontinence: No Spinal tumors: No Cauda equina syndrome: No Compression fracture: No Abdominal aneurysm: No  COGNITION: Overall cognitive status: Within functional limits for tasks assessed     SENSATION: Patient reports left lateral thigh numbness; diminished sensation throughout LLE with absent sensation at L2-4 and S1 dermatomes   POSTURE: rounded shoulders, forward head, and flexed trunk   PALPATION: TTP: L2-S1 spinous process  LUMBAR JOINT MOBILITY:   Hypomobile throughout with L2-S1 being painful   LUMBAR ROM:   AROM eval  Flexion 32; limited by pain  Extension 14; limited by  pain  Right lateral flexion 50% limited; "feels good"  Left lateral flexion 50% limited; limited by pain  Right rotation 75% limited; limited by pain  Left rotation 50% limited; limited by pain   (Blank rows = not tested)  LOWER EXTREMITY ROM: WFL for activities assessed  LOWER EXTREMITY MMT:    MMT Right eval Left eval  Hip flexion 4-/5; discomfort 3/5; familiar pain  Hip extension    Hip abduction    Hip adduction    Hip internal rotation    Hip external rotation    Knee flexion 4/5; slight pain (L>R) 4-/5; slight pain (L>R)  Knee extension 4-/5 3+/5; discomfort  Ankle dorsiflexion 3+/5 3/5  Ankle plantarflexion    Ankle inversion    Ankle eversion     (Blank rows = not tested)  FUNCTIONAL TESTS:  Bed mobility: slow and painful  GAIT: Assistive device utilized: None Level of assistance: Complete Independence Comments: decreased gait speed and stride length  TODAY'S TREATMENT:                                                                                                                              DATE:                                     1/3 EXERCISE LOG  Exercise Repetitions and Resistance Comments  Nustep L4 x 16 minutes   XTS Pull Downs Blue x 3 mins   Rockerboard X3 mins   Wall Squat X30 reps   Cybex Knee Flexion 30# x 35 reps   Cybex Knee Extension #10 x 35 reps   Lunges onto [employer] up; 25 reps each  Blank cell = exercise not performed today    Modalities  Date:  Unattended Estim: Lumbar, IFC 80-150 Hz, 15 mins, Pain and Tone                                   12/21 EXERCISE LOG  Exercise Repetitions and Resistance Comments  Nustep  L4 x 15 minutes   Resisted pull down  Blue XTS x 3 minutes   Gastroc stretch  4 x 30 seconds   Isometric lumbar extension    Isometric ball press w/ lumbar rotation 3 x 10 reps each w/ 5 second hold   Tandem balance on foam    Slouch overcorrect  20 reps   Self STM with tennis ball     Blank cell = exercise  not performed today                                    12/14 EXERCISE LOG  Exercise Repetitions and Resistance Comments  Nustep  L3 x 15 minutes    Isometric ball press 2 minutes w/ 5 second hold   Ball roll out  20 reps   Gastroc stretch  3 x 30 seconds   Hamstring stretch  2 x 30 seconds   Wall squat 30 reps   Bridge 5 reps  Limited by familiar pain   Blank cell = exercise not performed today  Manual Therapy Soft Tissue Mobilization: right lumbar region, STW/M to right lumbar paraspinals to decrease pain and tone with pt positioned in left side-lying for comfort with pillow between his knees    Modalities  Date:  Unattended Estim: Lumbar, IFC 80-150 Hz, 15 mins, Pain and Tone  PATIENT EDUCATION:  Education details: Plan of care, healing, prognosis, and goals for therapy Person educated: Patient Education method: Explanation Education comprehension: verbalized understanding  HOME EXERCISE PROGRAM:   ASSESSMENT:  CLINICAL IMPRESSION: Pt arrives for today's treatment session reporting 1/10 low back pain.  Pt reports that he was able to ambulate longer distance in [place] before being limited by pain. Pt introduced to cybex knee flexion and extension today with min cues required for eccentric control.  Normal responses to estim noted upon removal.  Pt denied any pain upon completion of today's treatment session.  OBJECTIVE IMPAIRMENTS: Abnormal gait, decreased activity tolerance, decreased mobility, difficulty walking, decreased ROM, decreased strength, hypomobility, impaired sensation, postural dysfunction, and pain.   ACTIVITY LIMITATIONS: carrying, lifting, bending, standing, squatting, sleeping, stairs, transfers, bed mobility, dressing, and locomotion level  PARTICIPATION LIMITATIONS: meal prep, cleaning, laundry, shopping, community activity, and yard work  PERSONAL FACTORS: Time since onset of injury/illness/exacerbation and 3+ comorbidities: Abdominal aortic aneurysm,  carpal tunnel, arthritis, and hypertension  are also affecting patient's functional outcome.   REHAB POTENTIAL: Fair    CLINICAL DECISION MAKING: Unstable/unpredictable  EVALUATION COMPLEXITY: High   GOALS: Goals reviewed with patient? Yes  SHORT TERM GOALS: Target date: 10/09/22  Patient will be independent with his initial HEP. Baseline: Goal status: INITIAL  2.  Patient will be able to stand and walk for at least 20 minutes without being limited by his familiar low back symptoms. Baseline:  Goal status: INITIAL  3.  Patient will be able to complete his daily activities without his familiar pain exceeding 8/10. Baseline:  Goal status: INITIAL  LONG TERM GOALS: Target date: 10/30/22  Patient will be independent with his advanced HEP. Baseline:  Goal status: INITIAL  2.  Patient will be able to walk for at least 30 minutes without being limited by his familiar low back symptoms. Baseline:  Goal status: INITIAL  3.  Patient will be able to complete his daily activities without his familiar low back pain exceeding 6/10. Baseline:  Goal status: INITIAL  4.  Patient will be able to transfer from supine to sitting with minimal to no low back pain. Baseline:  Goal status: INITIAL  PLAN:  PT FREQUENCY: 1-2x/week  PT DURATION: 6 weeks  PLANNED INTERVENTIONS: Therapeutic exercises, Therapeutic activity, Neuromuscular re-education, Balance training, Gait training, Patient/Family education, Self Care, Joint mobilization, Stair training, Dry Needling, Electrical stimulation, Spinal mobilization, Cryotherapy, Moist heat, Traction, Manual therapy, and Re-evaluation.  PLAN FOR NEXT SESSION: NuStep, lower extremity strengthening, log rolling, lifting mechanics, manual therapy, modalities as needed   Kathrynn Ducking, PTA 10/09/2022, 9:19 AM

## 2022-10-15 ENCOUNTER — Encounter: Payer: Self-pay | Admitting: Physical Therapy

## 2022-10-15 ENCOUNTER — Ambulatory Visit: Payer: Medicare HMO | Admitting: Physical Therapy

## 2022-10-15 DIAGNOSIS — M6281 Muscle weakness (generalized): Secondary | ICD-10-CM

## 2022-10-15 DIAGNOSIS — M5416 Radiculopathy, lumbar region: Secondary | ICD-10-CM

## 2022-10-15 NOTE — Therapy (Signed)
OUTPATIENT PHYSICAL THERAPY THORACOLUMBAR TREATMENT   Patient Name: James Davenport MRN: [id number] DOB:[birth year], 67 y.o., male Today's Date: 10/15/2022  END OF SESSION:  PT End of Session - 10/15/22 0819     Visit Number 7    Number of Visits 12    Date for PT Re-Evaluation 11/29/22    PT Start Time 0818    PT Stop Time 0905    PT Time Calculation (min) 47 min    Activity Tolerance Patient tolerated treatment well    Behavior During Therapy [facility] for tasks assessed/performed            Past Medical History:  Diagnosis Date   AAA (abdominal aortic aneurysm) without rupture ([place]) 09/19/2021   Aortic atherosclerosis ([place])    Arthritis 11/24/2020   Bilateral carpal tunnel syndrome 11/24/2020   Chronic left shoulder pain 11/24/2020   GERD (gastroesophageal reflux disease) 11/24/2020   Hyperlipidemia LDL goal <100 01/31/2016   Hypertension    Leg cramps 11/24/2020   Past Surgical History:  Procedure Laterality Date   LEG SURGERY Right 1975   Patient Active Problem List   Diagnosis Date Noted   Bee sting allergy 01/23/2022   AAA (abdominal aortic aneurysm) without rupture ([place]) 09/19/2021   Shortness of breath 08/13/2021   Seasonal allergies 01/09/2021   Seborrheic dermatitis 01/09/2021   History of colon polyps 01/09/2021   Aortic atherosclerosis ([facility])    GERD (gastroesophageal reflux disease) 11/24/2020   Arthritis 11/24/2020   Leg cramps 11/24/2020   Bilateral carpal tunnel syndrome 11/24/2020   Chronic left shoulder pain 11/24/2020   Hyperlipidemia LDL goal <100 01/31/2016   REFERRING PROVIDER: Gwenlyn Perking, FNP   REFERRING DIAG: Chronic midline low back pain with left-sided sciatica   Rationale for Evaluation and Treatment: Rehabilitation  THERAPY DIAG:  Radiculopathy, lumbar region  Muscle weakness (generalized)  ONSET DATE: about 8 years ago   SUBJECTIVE:                                                                                                                                                                                            SUBJECTIVE STATEMENT: Has had more discomfort in the last few days.  PERTINENT HISTORY:  Abdominal aortic aneurysm, carpal tunnel, arthritis, and hypertension  PAIN:  Are you having pain? Yes: NPRS scale: no score provided/10 Pain location: low back radiating down both legs Pain description: numbness (intermittent),  Aggravating factors: standing or walking (15 minutes at most), lifting, bending over,  Relieving factors: medication, rest  PRECAUTIONS: None  PATIENT GOALS: reduced pain, improved mobility, and resolve his numbness  NEXT MD VISIT: about 6  weeks  OBJECTIVE: all objective information was assessed at his initial evaluation on 09/18/22 unless otherwise noted  DIAGNOSTIC FINDINGS: 09/11/22 Lumbar x-ray IMPRESSION: Mild degenerative joint changes of lower lumbar spine.  LUMBAR ROM:   AROM eval  Flexion 32; limited by pain  Extension 14; limited by pain  Right lateral flexion 50% limited; "feels good"  Left lateral flexion 50% limited; limited by pain  Right rotation 75% limited; limited by pain  Left rotation 50% limited; limited by pain   (Blank rows = not tested)  LOWER EXTREMITY ROM: WFL for activities assessed  LOWER EXTREMITY MMT:    MMT Right eval Left eval  Hip flexion 4-/5; discomfort 3/5; familiar pain  Hip extension    Hip abduction    Hip adduction    Hip internal rotation    Hip external rotation    Knee flexion 4/5; slight pain (L>R) 4-/5; slight pain (L>R)  Knee extension 4-/5 3+/5; discomfort  Ankle dorsiflexion 3+/5 3/5  Ankle plantarflexion    Ankle inversion    Ankle eversion     (Blank rows = not tested)  TODAY'S TREATMENT:                                                                                                                              DATE:                                    1/9 EXERCISE LOG  Exercise Repetitions and  Resistance Comments  Nustep L4 x 15 minutes   XTS Pull Downs Blue x30 reps   XTS D2 Blue x20 rep   LTR 10 x5 sec   Cybex Knee Flexion 30# x 35 reps   Cybex Knee Extension #10 x 35 reps    Blank cell = exercise not performed today    Modalities  Date:  Unattended Estim: Lumbar, IFC 80-150 Hz, 10 mins, Pain and Tone  PATIENT EDUCATION:  Education details: Plan of care, healing, prognosis, and goals for therapy Person educated: Patient Education method: Explanation Education comprehension: verbalized understanding  HOME EXERCISE PROGRAM:  ASSESSMENT:  CLINICAL IMPRESSION: Patient presented in clinic with reports of more discomfort recently in the last few days. To go to local pharmacy and pick up TENS 7000 for home use. Patient able to tolerate therex with lumbar tightening when he sat at cybex machine. LTR completed to demonstrate stretch to patient for when muscle cramps are active. Normal stimulation response noted following removal of the modality. Patient instructed that if he needed further instruction on TENS set up that he could bring unit into clinic for instruction by staff.  OBJECTIVE IMPAIRMENTS: Abnormal gait, decreased activity tolerance, decreased mobility, difficulty walking, decreased ROM, decreased strength, hypomobility, impaired sensation, postural dysfunction, and pain.   ACTIVITY LIMITATIONS: carrying, lifting, bending, standing, squatting, sleeping, stairs, transfers, bed mobility, dressing, and locomotion level  PARTICIPATION LIMITATIONS: meal prep, cleaning, laundry, shopping, community activity, and yard work  PERSONAL FACTORS: Time since onset of injury/illness/exacerbation and 3+ comorbidities: Abdominal aortic aneurysm, carpal tunnel, arthritis, and hypertension  are also affecting patient's functional outcome.   REHAB POTENTIAL: Fair    CLINICAL DECISION MAKING: Unstable/unpredictable  EVALUATION COMPLEXITY: High  GOALS: Goals reviewed with  patient? Yes  SHORT TERM GOALS: Target date: 10/09/22  Patient will be independent with his initial HEP. Baseline: Goal status: INITIAL  2.  Patient will be able to stand and walk for at least 20 minutes without being limited by his familiar low back symptoms. Baseline:  Goal status: INITIAL  3.  Patient will be able to complete his daily activities without his familiar pain exceeding 8/10. Baseline:  Goal status: INITIAL  LONG TERM GOALS: Target date: 10/30/22  Patient will be independent with his advanced HEP. Baseline:  Goal status: INITIAL  2.  Patient will be able to walk for at least 30 minutes without being limited by his familiar low back symptoms. Baseline:  Goal status: INITIAL  3.  Patient will be able to complete his daily activities without his familiar low back pain exceeding 6/10. Baseline:  Goal status: INITIAL  4.  Patient will be able to transfer from supine to sitting with minimal to no low back pain. Baseline:  Goal status: INITIAL  PLAN:  PT FREQUENCY: 1-2x/week  PT DURATION: 6 weeks  PLANNED INTERVENTIONS: Therapeutic exercises, Therapeutic activity, Neuromuscular re-education, Balance training, Gait training, Patient/Family education, Self Care, Joint mobilization, Stair training, Dry Needling, Electrical stimulation, Spinal mobilization, Cryotherapy, Moist heat, Traction, Manual therapy, and Re-evaluation.  PLAN FOR NEXT SESSION: NuStep, lower extremity strengthening, log rolling, lifting mechanics, manual therapy, modalities as needed  Standley Brooking, PTA 10/15/2022, 9:50 AM

## 2022-10-17 ENCOUNTER — Ambulatory Visit: Payer: Medicare HMO

## 2022-10-17 DIAGNOSIS — M5416 Radiculopathy, lumbar region: Secondary | ICD-10-CM | POA: Diagnosis not present

## 2022-10-17 DIAGNOSIS — M6281 Muscle weakness (generalized): Secondary | ICD-10-CM

## 2022-10-17 NOTE — Therapy (Signed)
OUTPATIENT PHYSICAL THERAPY THORACOLUMBAR TREATMENT   Patient Name: James Davenport MRN: [id number] DOB:02-18-1956, 67 y.o., male Today's Date: 10/17/2022  END OF SESSION:  PT End of Session - 10/17/22 0820     Visit Number 8    Number of Visits 12    Date for PT Re-Evaluation 11/29/22    PT Start Time 0815    PT Stop Time 0915    PT Time Calculation (min) 60 min    Activity Tolerance Patient tolerated treatment well    Behavior During Therapy [facility] for tasks assessed/performed            Past Medical History:  Diagnosis Date   AAA (abdominal aortic aneurysm) without rupture ([place]) 09/19/2021   Aortic atherosclerosis ([place])    Arthritis 11/24/2020   Bilateral carpal tunnel syndrome 11/24/2020   Chronic left shoulder pain 11/24/2020   GERD (gastroesophageal reflux disease) 11/24/2020   Hyperlipidemia LDL goal <100 01/31/2016   Hypertension    Leg cramps 11/24/2020   Past Surgical History:  Procedure Laterality Date   LEG SURGERY Right 1975   Patient Active Problem List   Diagnosis Date Noted   Bee sting allergy 01/23/2022   AAA (abdominal aortic aneurysm) without rupture ([place]) 09/19/2021   Shortness of breath 08/13/2021   Seasonal allergies 01/09/2021   Seborrheic dermatitis 01/09/2021   History of colon polyps 01/09/2021   Aortic atherosclerosis ([facility])    GERD (gastroesophageal reflux disease) 11/24/2020   Arthritis 11/24/2020   Leg cramps 11/24/2020   Bilateral carpal tunnel syndrome 11/24/2020   Chronic left shoulder pain 11/24/2020   Hyperlipidemia LDL goal <100 01/31/2016   REFERRING PROVIDER: Gwenlyn Perking, FNP   REFERRING DIAG: Chronic midline low back pain with left-sided sciatica   Rationale for Evaluation and Treatment: Rehabilitation  THERAPY DIAG:  Radiculopathy, lumbar region  Muscle weakness (generalized)  ONSET DATE: about 8 years ago   SUBJECTIVE:                                                                                                                                                                                            SUBJECTIVE STATEMENT: Pt arrives for today's treatment session denying any pain.  Reports that back is feeling much better today.  PERTINENT HISTORY:  Abdominal aortic aneurysm, carpal tunnel, arthritis, and hypertension  PAIN:  Are you having pain? No  PRECAUTIONS: None  PATIENT GOALS: reduced pain, improved mobility, and resolve his numbness  NEXT MD VISIT: about 6 weeks  OBJECTIVE: all objective information was assessed at his initial evaluation on 09/18/22 unless otherwise noted  DIAGNOSTIC FINDINGS: 09/11/22 Lumbar x-ray IMPRESSION: Mild degenerative  joint changes of lower lumbar spine.  LUMBAR ROM:   AROM eval  Flexion 32; limited by pain  Extension 14; limited by pain  Right lateral flexion 50% limited; "feels good"  Left lateral flexion 50% limited; limited by pain  Right rotation 75% limited; limited by pain  Left rotation 50% limited; limited by pain   (Blank rows = not tested)  LOWER EXTREMITY ROM: WFL for activities assessed  LOWER EXTREMITY MMT:    MMT Right eval Left eval  Hip flexion 4-/5; discomfort 3/5; familiar pain  Hip extension    Hip abduction    Hip adduction    Hip internal rotation    Hip external rotation    Knee flexion 4/5; slight pain (L>R) 4-/5; slight pain (L>R)  Knee extension 4-/5 3+/5; discomfort  Ankle dorsiflexion 3+/5 3/5  Ankle plantarflexion    Ankle inversion    Ankle eversion     (Blank rows = not tested)  TODAY'S TREATMENT:                                                                                                                              DATE:                                    1/11 EXERCISE LOG  Exercise Repetitions and Resistance Comments  Nustep L4 x 20 minutes   XTS Pull Downs Blue x30 reps   XTS [place] Blue x20 reps bil   LTR    Cybex Knee Flexion 40# x 30 reps   Cybex Knee Extension 20# x 30  reps    Blank cell = exercise not performed today    Modalities  Date:  Unattended Estim: Lumbar, IFC 80-150 Hz, 15 mins, Pain and Tone  PATIENT EDUCATION:  Education details: Plan of care, healing, prognosis, and goals for therapy Person educated: Patient Education method: Explanation Education comprehension: verbalized understanding  HOME EXERCISE PROGRAM:  ASSESSMENT:  CLINICAL IMPRESSION: Pt arrives for today's treatment session denying any pain.  Pt reports performing stretches and exercises at home regularly.  Pt able to tolerate increased reps with various exercises today.  Pt also able to tolerate increased weight cybex exercises with minimal fatigue.  Normal responses to estim noted upon removal.  Pt denied any pain upon completion of today's treatment session.  OBJECTIVE IMPAIRMENTS: Abnormal gait, decreased activity tolerance, decreased mobility, difficulty walking, decreased ROM, decreased strength, hypomobility, impaired sensation, postural dysfunction, and pain.   ACTIVITY LIMITATIONS: carrying, lifting, bending, standing, squatting, sleeping, stairs, transfers, bed mobility, dressing, and locomotion level  PARTICIPATION LIMITATIONS: meal prep, cleaning, laundry, shopping, community activity, and yard work  PERSONAL FACTORS: Time since onset of injury/illness/exacerbation and 3+ comorbidities: Abdominal aortic aneurysm, carpal tunnel, arthritis, and hypertension  are also affecting patient's functional outcome.   REHAB POTENTIAL: Fair    CLINICAL DECISION MAKING: Unstable/unpredictable  EVALUATION COMPLEXITY: High  GOALS: Goals reviewed with patient? Yes  SHORT TERM GOALS: Target date: 10/09/22  Patient will be independent with his initial HEP. Baseline: Goal status: MET  2.  Patient will be able to stand and walk for at least 20 minutes without being limited by his familiar low back symptoms. Baseline:  Goal status: MET  3.  Patient will be able to  complete his daily activities without his familiar pain exceeding 8/10. Baseline:  Goal status: MET  LONG TERM GOALS: Target date: 10/30/22  Patient will be independent with his advanced HEP. Baseline:  Goal status: MET  2.  Patient will be able to walk for at least 30 minutes without being limited by his familiar low back symptoms. Baseline:  Goal status: IN PROGRESS  3.  Patient will be able to complete his daily activities without his familiar low back pain exceeding 6/10. Baseline:  Goal status: IN PROGRESS  4.  Patient will be able to transfer from supine to sitting with minimal to no low back pain. Baseline:  Goal status: IN PROGRESS  PLAN:  PT FREQUENCY: 1-2x/week  PT DURATION: 6 weeks  PLANNED INTERVENTIONS: Therapeutic exercises, Therapeutic activity, Neuromuscular re-education, Balance training, Gait training, Patient/Family education, Self Care, Joint mobilization, Stair training, Dry Needling, Electrical stimulation, Spinal mobilization, Cryotherapy, Moist heat, Traction, Manual therapy, and Re-evaluation.  PLAN FOR NEXT SESSION: NuStep, lower extremity strengthening, log rolling, lifting mechanics, manual therapy, modalities as needed  Kathrynn Ducking, PTA 10/17/2022, 9:56 AM

## 2022-10-21 ENCOUNTER — Ambulatory Visit (INDEPENDENT_AMBULATORY_CARE_PROVIDER_SITE_OTHER): Payer: Medicare HMO

## 2022-10-21 VITALS — Ht 69.0 in | Wt 203.0 lb

## 2022-10-21 DIAGNOSIS — Z0000 Encounter for general adult medical examination without abnormal findings: Secondary | ICD-10-CM

## 2022-10-21 NOTE — Progress Notes (Signed)
Subjective:   James Davenport is a 67 y.o. male who presents for Medicare Annual/Subsequent preventive examination.  I connected with  James Davenport on 10/21/22 by a audio enabled telemedicine application and verified that I am speaking with the correct person using two identifiers.  Patient Location: Home  Provider Location: Home Office  I discussed the limitations of evaluation and management by telemedicine. The patient expressed understanding and agreed to proceed.  Review of Systems     Cardiac Risk Factors include: advanced age (>72mn, >>72women);male gender     Objective:    Today's Vitals   10/21/22 1019  Weight: 203 lb (92.1 kg)  Height: '5\' 9"'$  (1.753 m)   Body mass index is 29.98 kg/m.     10/21/2022   10:21 AM 09/18/2022    1:55 PM 07/12/2021    9:11 AM  Advanced Directives  Does Patient Have a Medical Advance Directive? No No No  Would patient like information on creating a medical advance directive? No - Patient declined  No - Patient declined    Current Medications (verified) Outpatient Encounter Medications as of 10/21/2022  Medication Sig   acetaminophen (TYLENOL) 500 MG tablet Take 500 mg by mouth every 6 (six) hours as needed.   albuterol (VENTOLIN HFA) 108 (90 Base) MCG/ACT inhaler INHALE 2 PUFFS EVERY 6 HOURS AS NEEDED   aspirin 81 MG chewable tablet Chew 1 tablet by mouth daily at 2 PM.   Bempedoic Acid-Ezetimibe (NEXLIZET) 180-10 MG TABS Take 1 tablet by mouth daily.   cyclobenzaprine (FLEXERIL) 10 MG tablet TAKE 1 TABLET DAILY AS NEEDED FOR MUSCLE SPASMS.   EPINEPHRINE 0.3 mg/0.3 mL IJ SOAJ injection INJECT 0.3 MG INTO THE MUSCLE AS NEEDED FOR ANAPHYLAXIS.   fluticasone (FLONASE) 50 MCG/ACT nasal spray SPRAY 2 SPRAYS INTO EACH NOSTRIL EVERY DAY   gabapentin (NEURONTIN) 300 MG capsule Take 1 capsule (300 mg total) by mouth 2 (two) times daily.   ketoconazole (NIZORAL) 2 % cream Apply 1 application topically daily.   ketoconazole (NIZORAL) 2  % shampoo APPLY TOPICALLY 2 TIMES A WEEK. LEAVE ON FOR 3 TO 5 MINUTES BEFORE RINSING.   levocetirizine (XYZAL) 5 MG tablet Take 1 tablet (5 mg total) by mouth every evening.   pantoprazole (PROTONIX) 40 MG tablet Take 1 tablet (40 mg total) by mouth daily.   Tiotropium Bromide Monohydrate (SPIRIVA RESPIMAT) 1.25 MCG/ACT AERS INHALE 2 PUFFS ONE TIME DAILY   diclofenac (VOLTAREN) 75 MG EC tablet Take 1 tablet (75 mg total) by mouth 2 (two) times daily as needed. (Patient not taking: Reported on 10/21/2022)   No facility-administered encounter medications on file as of 10/21/2022.    Allergies (verified) Statins and Tramadol   History: Past Medical History:  Diagnosis Date   AAA (abdominal aortic aneurysm) without rupture ([facility] 09/19/2021   Aortic atherosclerosis ([place]    Arthritis 11/24/2020   Bilateral carpal tunnel syndrome 11/24/2020   Chronic left shoulder pain 11/24/2020   GERD (gastroesophageal reflux disease) 11/24/2020   Hyperlipidemia LDL goal <100 01/31/2016   Hypertension    Leg cramps 11/24/2020   Past Surgical History:  Procedure Laterality Date   LEG SURGERY Right 1975   Family History  Problem Relation Age of Onset   Lung disease Mother    Hypertension Mother    Asthma Daughter    Social History   Socioeconomic History   Marital status: Single    Spouse name: Not on file   Number of children: Not on file  Years of education: Not on file   Highest education level: Not on file  Occupational History   Not on file  Tobacco Use   Smoking status: Former    Types: Cigarettes    Quit date: 90    Years since quitting: 34.0   Smokeless tobacco: Never  Vaping Use   Vaping Use: Never used  Substance and Sexual Activity   Alcohol use: Yes    Comment: occ   Drug use: Never   Sexual activity: Not on file  Other Topics Concern   Not on file  Social History Narrative   Not on file   Social Determinants of Health   Financial Resource Strain: Low Risk   (10/21/2022)   Overall Financial Resource Strain (CARDIA)    Difficulty of Paying Living Expenses: Not hard at all  Food Insecurity: No Food Insecurity (10/21/2022)   Hunger Vital Sign    Worried About Running Out of Food in the Last Year: Never true    [place] in the Last Year: Never true  Transportation Needs: No Transportation Needs (10/21/2022)   PRAPARE - Hydrologist (Medical): No    Lack of Transportation (Non-Medical): No  Physical Activity: Sufficiently Active (10/21/2022)   Exercise Vital Sign    Days of Exercise per Week: 5 days    Minutes of Exercise per Session: 30 min  Stress: No Stress Concern Present (10/21/2022)   [place]    Feeling of Stress : Not at all  Social Connections: Moderately Isolated (10/21/2022)   Social Connection and Isolation Panel [NHANES]    Frequency of Communication with Friends and Family: More than three times a week    Frequency of Social Gatherings with Friends and Family: Three times a week    Attends Religious Services: 1 to 4 times per year    Active Member of Clubs or Organizations: No    Attends Archivist Meetings: Never    Marital Status: Never married    Tobacco Counseling Counseling given: Not Answered   Clinical Intake:  Pre-visit preparation completed: Yes  Pain : No/denies pain  Diabetes: No  How often do you need to have someone help you when you read instructions, pamphlets, or other written materials from your doctor or pharmacy?: 1 - Never  Diabetic?No  Interpreter Needed?: No  Information entered by :: Denman George LPN   Activities of Daily Living    10/21/2022   10:21 AM  In your present state of health, do you have any difficulty performing the following activities:  Hearing? 0  Vision? 0  Difficulty concentrating or making decisions? 0  Walking or climbing stairs? 0  Dressing or bathing? 0   Doing errands, shopping? 0  Preparing Food and eating ? N  Using the Toilet? N  In the past six months, have you accidently leaked urine? N  Do you have problems with loss of bowel control? N  Managing your Medications? N  Managing your Finances? N  Housekeeping or managing your Housekeeping? N    Patient Care Team: Gwenlyn Perking, FNP as PCP - General (Family Medicine) Gala Romney Cristopher Estimable, MD as Consulting Physician (Gastroenterology) Gwenlyn Perking, FNP (Family Medicine)  Indicate any recent Medical Services you may have received from other than [facility] providers in the past year (date may be approximate).     Assessment:   This is a routine wellness examination for Tarvaris.  Hearing/Vision screen Hearing Screening - Comments:: Denies hearing difficulties   Vision Screening - Comments:: up to date with routine eye exams    Dietary issues and exercise activities discussed: Current Exercise Habits: Structured exercise class, Type of exercise: stretching, Time (Minutes): 30, Frequency (Times/Week): 5, Weekly Exercise (Minutes/Week): 150, Intensity: Mild   Goals Addressed   None   Depression Screen    10/21/2022   10:56 AM 09/11/2022   10:02 AM 03/07/2022   10:16 AM 01/23/2022    8:27 AM 09/12/2021    9:12 AM 08/13/2021    8:18 AM 07/12/2021    9:18 AM  PHQ 2/9 Scores  PHQ - 2 Score 0  0 0 0  0  PHQ- 9 Score   0 0 0  0  Exception Documentation  Patient refusal    Patient refusal     Fall Risk    10/21/2022   10:20 AM 09/11/2022    9:59 AM 03/07/2022   10:15 AM 01/23/2022    8:27 AM 09/12/2021    9:12 AM  [place] in the past year? 0 0 0 0 0  Number falls in past yr: 0      Injury with Fall? 0      Risk for fall due to : No Fall Risks      Follow up Falls evaluation completed;Education provided;Falls prevention discussed        FALL RISK PREVENTION PERTAINING TO THE HOME:  Any stairs in or around the home? Yes  If so, are there any without handrails? No   Home free of loose throw rugs in walkways, pet beds, electrical cords, etc? Yes  Adequate lighting in your home to reduce risk of falls? Yes   ASSISTIVE DEVICES UTILIZED TO PREVENT FALLS:  Life alert? No  Use of a cane, walker or w/c? No  Grab bars in the bathroom? Yes  Shower chair or bench in shower? No  Elevated toilet seat or a handicapped toilet? Yes   TIMED UP AND GO:  Was the test performed? No . Telephonic Visit   Cognitive Function:    07/12/2021    9:13 AM  MMSE - Mini Mental State Exam  Orientation to time 5  Orientation to Place 5  Registration 3  Attention/ Calculation 5  Recall 1  Language- name 2 objects 2  Language- repeat 1  Language- follow 3 step command 3  Language- read & follow direction 1  Write a sentence 1  Copy design 1  Total score 28        10/21/2022   10:21 AM  6CIT Screen  What Year? 0 points  What month? 0 points  What time? 0 points  Count back from 20 0 points  Months in reverse 0 points  Repeat phrase 0 points  Total Score 0 points    Immunizations Immunization History  Administered Date(s) Administered   Tdap 10/07/2016    TDAP status: Up to date  Flu Vaccine status: Declined, Education has been provided regarding the importance of this vaccine but patient still declined. Advised may receive this vaccine at local pharmacy or Health Dept. Aware to provide a copy of the vaccination record if obtained from local pharmacy or Health Dept. Verbalized acceptance and understanding.  Pneumococcal vaccine status: Declined,  Education has been provided regarding the importance of this vaccine but patient still declined. Advised may receive this vaccine at local pharmacy or Health Dept. Aware to provide a copy of the  vaccination record if obtained from local pharmacy or Health Dept. Verbalized acceptance and understanding.   Covid-19 vaccine status: Information provided on how to obtain vaccines.   Qualifies for Shingles Vaccine?  Yes   Zostavax completed No   Shingrix Completed?: No.    Education has been provided regarding the importance of this vaccine. Patient has been advised to call insurance company to determine out of pocket expense if they have not yet received this vaccine. Advised may also receive vaccine at local pharmacy or Health Dept. Verbalized acceptance and understanding.  Screening Tests Health Maintenance  Topic Date Due   INFLUENZA VACCINE  01/05/2023 (Originally 05/07/2022)   COVID-19 Vaccine (1) 09/28/2023 (Originally 03/04/1957)   Pneumonia Vaccine 36+ Years old (1 - PCV) 10/22/2023 (Originally 09/04/2020)   Zoster Vaccines- Shingrix (1 of 2) 12/11/2023 (Originally 09/04/2005)   Medicare Annual Wellness (AWV)  10/22/2023   Fecal DNA (Cologuard)  06/25/2025   DTaP/Tdap/Td (2 - Td or Tdap) 10/07/2026   Hepatitis C Screening  Completed   HPV VACCINES  Aged Out   COLONOSCOPY (Pts 45-60yr Insurance coverage will need to be confirmed)  Discontinued    Health Maintenance  There are no preventive care reminders to display for this patient.   Colorectal cancer screening: Type of screening: Cologuard. Completed 06/25/22. Repeat every 3 years  Lung Cancer Screening: (Low Dose CT Chest recommended if Age 256-80years, 30 pack-year currently smoking OR have quit w/in 15years.) does not qualify.   Lung Cancer Screening Referral: n/a   Additional Screening:  Hepatitis C Screening: does qualify; Completed 11/29/20  Vision Screening: Recommended annual ophthalmology exams for early detection of glaucoma and other disorders of the eye. Is the patient up to date with their annual eye exam?  Yes  Who is the provider or what is the name of the office in which the patient attends annual eye exams? [facility] If pt is not established with a provider, would they like to be referred to a provider to establish care? No .   Dental Screening: Recommended annual dental exams for proper oral  hygiene  Community Resource Referral / Chronic Care Management: CRR required this visit?  No   CCM required this visit?  No      Plan:     I have personally reviewed and noted the following in the patient's chart:   Medical and social history Use of alcohol, tobacco or illicit drugs  Current medications and supplements including opioid prescriptions. Patient is not currently taking opioid prescriptions. Functional ability and status Nutritional status Physical activity Advanced directives List of other physicians Hospitalizations, surgeries, and ER visits in previous 12 months Vitals Screenings to include cognitive, depression, and falls Referrals and appointments  In addition, I have reviewed and discussed with patient certain preventive protocols, quality metrics, and best practice recommendations. A written personalized care plan for preventive services as well as general preventive health recommendations were provided to patient.     SVanetta Mulders [place]  [id number]/29/4765  Due to this being a virtual visit, the after visit summary with patients personalized plan was offered to patient via mail or my-chart.  Patient would like to access on my-chart   Nurse Notes: Patient states that he is having side effects from Voltaren and would rather take Meloxicam.  Has stopped Voltaren and appointment scheduled for 11/04/22, continues to go to PT.

## 2022-10-21 NOTE — Patient Instructions (Signed)
[name] , Thank you for taking time to come for your Medicare Wellness Visit. I appreciate your ongoing commitment to your health goals. Please review the following plan we discussed and let me know if I can assist you in the future.   These are the goals we discussed:  Goals      Client will report no fall or injuries in the next 12 months.        This is a list of the screening recommended for you and due dates:  Health Maintenance  Topic Date Due   Pneumonia Vaccine (1 - PCV) Never done   Flu Shot  01/05/2023*   COVID-19 Vaccine (1) 09/28/2023*   Zoster (Shingles) Vaccine (1 of 2) 12/11/2023*   Medicare Annual Wellness Visit  10/22/2023   Cologuard (Stool DNA test)  06/25/2025   DTaP/Tdap/Td vaccine (2 - Td or Tdap) 10/07/2026   Hepatitis C Screening: USPSTF Recommendation to screen - Ages 68-79 yo.  Completed   HPV Vaccine  Aged Out   Colon Cancer Screening  Discontinued  *Topic was postponed. The date shown is not the original due date.    Advanced directives: Forms are available if you choose in the future to pursue completion.  This is recommended in order to make sure that your health wishes are honored in the event that you are unable to verbalize them to the provider.    Conditions/risks identified: Aim for 30 minutes of exercise or brisk walking, 6-8 glasses of water, and 5 servings of fruits and vegetables each day.   Next appointment: Follow up in one year for your annual wellness visit.   Preventive Care 28 Years and Older, Male  Preventive care refers to lifestyle choices and visits with your health care provider that can promote health and wellness. What does preventive care include? A yearly physical exam. This is also called an annual well check. Dental exams once or twice a year. Routine eye exams. Ask your health care provider how often you should have your eyes checked. Personal lifestyle choices, including: Daily care of your teeth and  gums. Regular physical activity. Eating a healthy diet. Avoiding tobacco and drug use. Limiting alcohol use. Practicing safe sex. Taking low doses of aspirin every day. Taking vitamin and mineral supplements as recommended by your health care provider. What happens during an annual well check? The services and screenings done by your health care provider during your annual well check will depend on your age, overall health, lifestyle risk factors, and family history of disease. Counseling  Your health care provider may ask you questions about your: Alcohol use. Tobacco use. Drug use. Emotional well-being. Home and relationship well-being. Sexual activity. Eating habits. History of falls. Memory and ability to understand (cognition). Work and work Statistician. Screening  You may have the following tests or measurements: Height, weight, and BMI. Blood pressure. Lipid and cholesterol levels. These may be checked every 5 years, or more frequently if you are over 44 years old. Skin check. Lung cancer screening. You may have this screening every year starting at age 68 if you have a 30-pack-year history of smoking and currently smoke or have quit within the past 15 years. Fecal occult blood test (FOBT) of the stool. You may have this test every year starting at age 68. Flexible sigmoidoscopy or colonoscopy. You may have a sigmoidoscopy every 5 years or a colonoscopy every 10 years starting at age 68. Prostate cancer screening. Recommendations will vary depending on your family  history and other risks. Hepatitis C blood test. Hepatitis B blood test. Sexually transmitted disease (STD) testing. Diabetes screening. This is done by checking your blood sugar (glucose) after you have not eaten for a while (fasting). You may have this done every 1-3 years. Abdominal aortic aneurysm (AAA) screening. You may need this if you are a current or former smoker. Osteoporosis. You may be screened  starting at age 68 if you are at high risk. Talk with your health care provider about your test results, treatment options, and if necessary, the need for more tests. Vaccines  Your health care provider may recommend certain vaccines, such as: Influenza vaccine. This is recommended every year. Tetanus, diphtheria, and acellular pertussis (Tdap, Td) vaccine. You may need a Td booster every 10 years. Zoster vaccine. You may need this after age 68. Pneumococcal 13-valent conjugate (PCV13) vaccine. One dose is recommended after age 68. Pneumococcal polysaccharide (PPSV23) vaccine. One dose is recommended after age 68. Talk to your health care provider about which screenings and vaccines you need and how often you need them. This information is not intended to replace advice given to you by your health care provider. Make sure you discuss any questions you have with your health care provider. Document Released: 10/20/2015 Document Revised: 06/12/2016 Document Reviewed: 07/25/2015 Elsevier Interactive Patient Education  2017 [place] Prevention in the Home Falls can cause injuries. They can happen to people of all ages. There are many things you can do to make your home safe and to help prevent falls. What can I do on the outside of my home? Regularly fix the edges of walkways and driveways and fix any cracks. Remove anything that might make you trip as you walk through a door, such as a raised step or threshold. Trim any bushes or trees on the path to your home. Use bright outdoor lighting. Clear any walking paths of anything that might make someone trip, such as rocks or tools. Regularly check to see if handrails are loose or broken. Make sure that both sides of any steps have handrails. Any raised decks and porches should have guardrails on the edges. Have any leaves, snow, or ice cleared regularly. Use sand or salt on walking paths during winter. Clean up any spills in your garage  right away. This includes oil or grease spills. What can I do in the bathroom? Use night lights. Install grab bars by the toilet and in the tub and shower. Do not use towel bars as grab bars. Use non-skid mats or decals in the tub or shower. If you need to sit down in the shower, use a plastic, non-slip stool. Keep the floor dry. Clean up any water that spills on the floor as soon as it happens. Remove soap buildup in the tub or shower regularly. Attach bath mats securely with double-sided non-slip rug tape. Do not have throw rugs and other things on the floor that can make you trip. What can I do in the bedroom? Use night lights. Make sure that you have a light by your bed that is easy to reach. Do not use any sheets or blankets that are too big for your bed. They should not hang down onto the floor. Have a firm chair that has side arms. You can use this for support while you get dressed. Do not have throw rugs and other things on the floor that can make you trip. What can I do in the kitchen? Clean up any  spills right away. Avoid walking on wet floors. Keep items that you use a lot in easy-to-reach places. If you need to reach something above you, use a strong step stool that has a grab bar. Keep electrical cords out of the way. Do not use floor polish or wax that makes floors slippery. If you must use wax, use non-skid floor wax. Do not have throw rugs and other things on the floor that can make you trip. What can I do with my stairs? Do not leave any items on the stairs. Make sure that there are handrails on both sides of the stairs and use them. Fix handrails that are broken or loose. Make sure that handrails are as long as the stairways. Check any carpeting to make sure that it is firmly attached to the stairs. Fix any carpet that is loose or worn. Avoid having throw rugs at the top or bottom of the stairs. If you do have throw rugs, attach them to the floor with carpet tape. Make  sure that you have a light switch at the top of the stairs and the bottom of the stairs. If you do not have them, ask someone to add them for you. What else can I do to help prevent falls? Wear shoes that: Do not have high heels. Have rubber bottoms. Are comfortable and fit you well. Are closed at the toe. Do not wear sandals. If you use a stepladder: Make sure that it is fully opened. Do not climb a closed stepladder. Make sure that both sides of the stepladder are locked into place. Ask someone to hold it for you, if possible. Clearly mark and make sure that you can see: Any grab bars or handrails. First and last steps. Where the edge of each step is. Use tools that help you move around (mobility aids) if they are needed. These include: Canes. Walkers. Scooters. Crutches. Turn on the lights when you go into a dark area. Replace any light bulbs as soon as they burn out. Set up your furniture so you have a clear path. Avoid moving your furniture around. If any of your floors are uneven, fix them. If there are any pets around you, be aware of where they are. Review your medicines with your doctor. Some medicines can make you feel dizzy. This can increase your chance of falling. Ask your doctor what other things that you can do to help prevent falls. This information is not intended to replace advice given to you by your health care provider. Make sure you discuss any questions you have with your health care provider. Document Released: 07/20/2009 Document Revised: 02/29/2016 Document Reviewed: 10/28/2014 Elsevier Interactive Patient Education  2017 [employer].

## 2022-10-24 ENCOUNTER — Ambulatory Visit: Payer: Medicare HMO

## 2022-10-24 DIAGNOSIS — M5416 Radiculopathy, lumbar region: Secondary | ICD-10-CM | POA: Diagnosis not present

## 2022-10-24 DIAGNOSIS — M6281 Muscle weakness (generalized): Secondary | ICD-10-CM

## 2022-10-24 NOTE — Therapy (Signed)
OUTPATIENT PHYSICAL THERAPY THORACOLUMBAR TREATMENT   Patient Name: Uno Esau MRN: [id number] DOB:October 26, 1955, 67 y.o., male Today's Date: 10/24/2022  END OF SESSION:  PT End of Session - 10/24/22 0814     Visit Number 9    Number of Visits 12    Date for PT Re-Evaluation 11/29/22    PT Start Time 0815    PT Stop Time 0910    PT Time Calculation (min) 55 min    Activity Tolerance Patient tolerated treatment well    Behavior During Therapy [facility] for tasks assessed/performed            Past Medical History:  Diagnosis Date   AAA (abdominal aortic aneurysm) without rupture ([place]) 09/19/2021   Aortic atherosclerosis ([place])    Arthritis 11/24/2020   Bilateral carpal tunnel syndrome 11/24/2020   Chronic left shoulder pain 11/24/2020   GERD (gastroesophageal reflux disease) 11/24/2020   Hyperlipidemia LDL goal <100 01/31/2016   Hypertension    Leg cramps 11/24/2020   Past Surgical History:  Procedure Laterality Date   LEG SURGERY Right 1975   Patient Active Problem List   Diagnosis Date Noted   Bee sting allergy 01/23/2022   AAA (abdominal aortic aneurysm) without rupture ([place]) 09/19/2021   Shortness of breath 08/13/2021   Seasonal allergies 01/09/2021   Seborrheic dermatitis 01/09/2021   History of colon polyps 01/09/2021   Aortic atherosclerosis ([facility])    GERD (gastroesophageal reflux disease) 11/24/2020   Arthritis 11/24/2020   Leg cramps 11/24/2020   Bilateral carpal tunnel syndrome 11/24/2020   Chronic left shoulder pain 11/24/2020   Hyperlipidemia LDL goal <100 01/31/2016   REFERRING PROVIDER: Gwenlyn Perking, FNP   REFERRING DIAG: Chronic midline low back pain with left-sided sciatica   Rationale for Evaluation and Treatment: Rehabilitation  THERAPY DIAG:  Radiculopathy, lumbar region  Muscle weakness (generalized)  ONSET DATE: about 8 years ago   SUBJECTIVE:                                                                                                                                                                                            SUBJECTIVE STATEMENT: Patient reports that he was feeling good after his last appointment until yesterday. He is unsure what caused this increased pain, but he notes that it is still hurting more today. His pain medication is beginning to help this morning.   PERTINENT HISTORY:  Abdominal aortic aneurysm, carpal tunnel, arthritis, and hypertension  PAIN:  Are you having pain? Yes: NPRS scale: 5/10 Pain location: low back Pain description: aggravating   PRECAUTIONS: None  PATIENT GOALS: reduced pain, improved mobility, and resolve  his numbness  NEXT MD VISIT: about 6 weeks  OBJECTIVE: all objective information was assessed at his initial evaluation on 09/18/22 unless otherwise noted  DIAGNOSTIC FINDINGS: 09/11/22 Lumbar x-ray IMPRESSION: Mild degenerative joint changes of lower lumbar spine.  LUMBAR ROM:   AROM eval  Flexion 32; limited by pain  Extension 14; limited by pain  Right lateral flexion 50% limited; "feels good"  Left lateral flexion 50% limited; limited by pain  Right rotation 75% limited; limited by pain  Left rotation 50% limited; limited by pain   (Blank rows = not tested)  LOWER EXTREMITY ROM: WFL for activities assessed  LOWER EXTREMITY MMT:    MMT Right eval Left eval  Hip flexion 4-/5; discomfort 3/5; familiar pain  Hip extension    Hip abduction    Hip adduction    Hip internal rotation    Hip external rotation    Knee flexion 4/5; slight pain (L>R) 4-/5; slight pain (L>R)  Knee extension 4-/5 3+/5; discomfort  Ankle dorsiflexion 3+/5 3/5  Ankle plantarflexion    Ankle inversion    Ankle eversion     (Blank rows = not tested)  TODAY'S TREATMENT:                                                                                                                              DATE:                                     1/18 EXERCISE LOG  Exercise  Repetitions and Resistance Comments  Nustep  L4 x 16 minutes   Ball roll out  2 minutes   XTS Pull Down  Blue x 3 minutes   XTS Pull Down with rotation  Blue x 3 minutes    Multifidus press out with shoulder flexion  Green t-band x 30 reps each     Blank cell = exercise not performed today  Modalities  Date:  Unattended Estim: Lumbar, IFC @ 80-150 Hz w/ 40% scan, 10 mins, Pain                           1/11 EXERCISE LOG  Exercise Repetitions and Resistance Comments  Nustep L4 x 20 minutes   XTS Pull Downs Blue x30 reps   XTS [place] Blue x20 reps bil   LTR    Cybex Knee Flexion 40# x 30 reps   Cybex Knee Extension 20# x 30 reps    Blank cell = exercise not performed today    Modalities  Date:  Unattended Estim: Lumbar, IFC 80-150 Hz, 15 mins, Pain and Tone  PATIENT EDUCATION:  Education details: Plan of care, healing, and anatomy Person educated: Patient Education method: Explanation Education comprehension: verbalized understanding  HOME EXERCISE PROGRAM:  ASSESSMENT:  CLINICAL IMPRESSION: Patient presented to treatment with increased  low back pain with no known cause. Treatment focused on familiar interventions for improved lumbar strength and stability. He required minimal cueing with multifidus press outs for lumbar stability and prevent lumbar rotation. He experienced a mild increase in left shoulder pain with this intervention, but no increase in his familiar low back pain. He reported feeling better upon the conclusion of treatment. He continues to require skilled physical therapy to address his remaining impairments to return to his prior level of function.   OBJECTIVE IMPAIRMENTS: Abnormal gait, decreased activity tolerance, decreased mobility, difficulty walking, decreased ROM, decreased strength, hypomobility, impaired sensation, postural dysfunction, and pain.   ACTIVITY LIMITATIONS: carrying, lifting, bending, standing, squatting, sleeping, stairs,  transfers, bed mobility, dressing, and locomotion level  PARTICIPATION LIMITATIONS: meal prep, cleaning, laundry, shopping, community activity, and yard work  PERSONAL FACTORS: Time since onset of injury/illness/exacerbation and 3+ comorbidities: Abdominal aortic aneurysm, carpal tunnel, arthritis, and hypertension  are also affecting patient's functional outcome.   REHAB POTENTIAL: Fair    CLINICAL DECISION MAKING: Unstable/unpredictable  EVALUATION COMPLEXITY: High  GOALS: Goals reviewed with patient? Yes  SHORT TERM GOALS: Target date: 10/09/22  Patient will be independent with his initial HEP. Baseline: Goal status: MET  2.  Patient will be able to stand and walk for at least 20 minutes without being limited by his familiar low back symptoms. Baseline:  Goal status: MET  3.  Patient will be able to complete his daily activities without his familiar pain exceeding 8/10. Baseline:  Goal status: MET  LONG TERM GOALS: Target date: 10/30/22  Patient will be independent with his advanced HEP. Baseline:  Goal status: MET  2.  Patient will be able to walk for at least 30 minutes without being limited by his familiar low back symptoms. Baseline:  Goal status: IN PROGRESS  3.  Patient will be able to complete his daily activities without his familiar low back pain exceeding 6/10. Baseline:  Goal status: IN PROGRESS  4.  Patient will be able to transfer from supine to sitting with minimal to no low back pain. Baseline:  Goal status: IN PROGRESS  PLAN:  PT FREQUENCY: 1-2x/week  PT DURATION: 6 weeks  PLANNED INTERVENTIONS: Therapeutic exercises, Therapeutic activity, Neuromuscular re-education, Balance training, Gait training, Patient/Family education, Self Care, Joint mobilization, Stair training, Dry Needling, Electrical stimulation, Spinal mobilization, Cryotherapy, Moist heat, Traction, Manual therapy, and Re-evaluation.  PLAN FOR NEXT SESSION: NuStep, lower extremity  strengthening, log rolling, lifting mechanics, manual therapy, modalities as needed  Darlin Coco, PT 10/24/2022, 12:40 PM

## 2022-10-31 ENCOUNTER — Ambulatory Visit: Payer: Medicare HMO

## 2022-10-31 DIAGNOSIS — M6281 Muscle weakness (generalized): Secondary | ICD-10-CM | POA: Diagnosis not present

## 2022-10-31 DIAGNOSIS — M5416 Radiculopathy, lumbar region: Secondary | ICD-10-CM | POA: Diagnosis not present

## 2022-10-31 NOTE — Therapy (Addendum)
OUTPATIENT PHYSICAL THERAPY THORACOLUMBAR TREATMENT   Patient Name: James Davenport MRN: [id number] DOB:August 19, 1956, 67 y.o., male Today's Date: 10/31/2022  END OF SESSION:  PT End of Session - 10/31/22 0907     Visit Number 10    Number of Visits 12    Date for PT Re-Evaluation 11/29/22    PT Start Time 0900    PT Stop Time 6378    PT Time Calculation (min) 62 min    Activity Tolerance Patient tolerated treatment well    Behavior During Therapy [facility] for tasks assessed/performed            Past Medical History:  Diagnosis Date   AAA (abdominal aortic aneurysm) without rupture ([place]) 09/19/2021   Aortic atherosclerosis ([place])    Arthritis 11/24/2020   Bilateral carpal tunnel syndrome 11/24/2020   Chronic left shoulder pain 11/24/2020   GERD (gastroesophageal reflux disease) 11/24/2020   Hyperlipidemia LDL goal <100 01/31/2016   Hypertension    Leg cramps 11/24/2020   Past Surgical History:  Procedure Laterality Date   LEG SURGERY Right 1975   Patient Active Problem List   Diagnosis Date Noted   Bee sting allergy 01/23/2022   AAA (abdominal aortic aneurysm) without rupture ([place]) 09/19/2021   Shortness of breath 08/13/2021   Seasonal allergies 01/09/2021   Seborrheic dermatitis 01/09/2021   History of colon polyps 01/09/2021   Aortic atherosclerosis ([facility])    GERD (gastroesophageal reflux disease) 11/24/2020   Arthritis 11/24/2020   Leg cramps 11/24/2020   Bilateral carpal tunnel syndrome 11/24/2020   Chronic left shoulder pain 11/24/2020   Hyperlipidemia LDL goal <100 01/31/2016   REFERRING PROVIDER: Gwenlyn Perking, FNP   REFERRING DIAG: Chronic midline low back pain with left-sided sciatica   Rationale for Evaluation and Treatment: Rehabilitation  THERAPY DIAG:  Radiculopathy, lumbar region  Muscle weakness (generalized)  ONSET DATE: about 8 years ago   SUBJECTIVE:                                                                                                                                                                                            SUBJECTIVE STATEMENT: Pt reports increased pain yesterday with back "catching" several times while working.  Did not do any work out of the ordinary.  PERTINENT HISTORY:  Abdominal aortic aneurysm, carpal tunnel, arthritis, and hypertension  PAIN:  Are you having pain? Yes: NPRS scale: 4/10 Pain location: low back Pain description: aggravating   PRECAUTIONS: None  PATIENT GOALS: reduced pain, improved mobility, and resolve his numbness  NEXT MD VISIT: about 6 weeks  OBJECTIVE: all objective information was assessed at his initial  evaluation on 09/18/22 unless otherwise noted  DIAGNOSTIC FINDINGS: 09/11/22 Lumbar x-ray IMPRESSION: Mild degenerative joint changes of lower lumbar spine.  LUMBAR ROM:   AROM eval  Flexion 32; limited by pain  Extension 14; limited by pain  Right lateral flexion 50% limited; "feels good"  Left lateral flexion 50% limited; limited by pain  Right rotation 75% limited; limited by pain  Left rotation 50% limited; limited by pain   (Blank rows = not tested)  LOWER EXTREMITY ROM: WFL for activities assessed  LOWER EXTREMITY MMT:    MMT Right eval Left eval  Hip flexion 4-/5; discomfort 3/5; familiar pain  Hip extension    Hip abduction    Hip adduction    Hip internal rotation    Hip external rotation    Knee flexion 4/5; slight pain (L>R) 4-/5; slight pain (L>R)  Knee extension 4-/5 3+/5; discomfort  Ankle dorsiflexion 3+/5 3/5  Ankle plantarflexion    Ankle inversion    Ankle eversion     (Blank rows = not tested)  TODAY'S TREATMENT:                                                                                                                              DATE:                                     1/25 EXERCISE LOG  Exercise Repetitions and Resistance Comments  Nustep  L4 x 17 minutes   Ball roll out  2 minutes   XTS Pull Down   Blue x 3 minutes   XTS Pull Down with rotation     Multifidus press out with shoulder flexion      Blank cell = exercise not performed today  Modalities  Date:  Unattended Estim: Lumbar, IFC @ 80-150 Hz w/ 40% scan, 10 mins, Pain  Manual Therapy Soft Tissue Mobilization: low back, STW/M to bil lumbar paraspinals to decrease pain and tone with pt in left side-lying for comfort    PATIENT EDUCATION:  Education details: Plan of care, healing, and anatomy Person educated: Patient Education method: Explanation Education comprehension: verbalized understanding  HOME EXERCISE PROGRAM:  ASSESSMENT:  CLINICAL IMPRESSION: Pt arrives for today's treatment session reporting 4/10 low back pain today.  Pt reports yesterday was a bad day, with his back "catching" numerous times throughout the day. STW/M performed to pt's bil lumber paraspinals to decrease pain and tone with pt in left side-lying for comfort.  Normal responses to estim noted upon removal.  Pt reported 2/10 low back pain at completion of today's treatment session.   10/31/22 PROGRESS REPORT:  Patient is making good progress with skilled physical therapy as evidenced by subjective reports, objective measures, functional mobility, and progress towards his goals. He was able to meet all of his short-term goals for therapy at this time. However, his familiar  low back pain continues to limit him with prolonged activities such as walking and other daily activities. Recommend that he continue with skilled physical therapy to address his remaining impairments to return to his prior level of function.  Jacqulynn Cadet, PT, DPT   OBJECTIVE IMPAIRMENTS: Abnormal gait, decreased activity tolerance, decreased mobility, difficulty walking, decreased ROM, decreased strength, hypomobility, impaired sensation, postural dysfunction, and pain.   ACTIVITY LIMITATIONS: carrying, lifting, bending, standing, squatting, sleeping, stairs, transfers, bed  mobility, dressing, and locomotion level  PARTICIPATION LIMITATIONS: meal prep, cleaning, laundry, shopping, community activity, and yard work  PERSONAL FACTORS: Time since onset of injury/illness/exacerbation and 3+ comorbidities: Abdominal aortic aneurysm, carpal tunnel, arthritis, and hypertension  are also affecting patient's functional outcome.   REHAB POTENTIAL: Fair    CLINICAL DECISION MAKING: Unstable/unpredictable  EVALUATION COMPLEXITY: High  GOALS: Goals reviewed with patient? Yes  SHORT TERM GOALS: Target date: 10/09/22  Patient will be independent with his initial HEP. Baseline: Goal status: MET  2.  Patient will be able to stand and walk for at least 20 minutes without being limited by his familiar low back symptoms. Baseline:  Goal status: MET  3.  Patient will be able to complete his daily activities without his familiar pain exceeding 8/10. Baseline:  Goal status: MET  LONG TERM GOALS: Target date: 10/30/22  Patient will be independent with his advanced HEP. Baseline:  Goal status: MET  2.  Patient will be able to walk for at least 30 minutes without being limited by his familiar low back symptoms. Baseline: 1/25: depends on the day Goal status: IN PROGRESS  3.  Patient will be able to complete his daily activities without his familiar low back pain exceeding 6/10. Baseline: 1/25: depends on the day Goal status: IN PROGRESS  4.  Patient will be able to transfer from supine to sitting with minimal to no low back pain. Baseline: 1/25: depends on the day Goal status: IN PROGRESS  PLAN:  PT FREQUENCY: 1-2x/week  PT DURATION: 6 weeks  PLANNED INTERVENTIONS: Therapeutic exercises, Therapeutic activity, Neuromuscular re-education, Balance training, Gait training, Patient/Family education, Self Care, Joint mobilization, Stair training, Dry Needling, Electrical stimulation, Spinal mobilization, Cryotherapy, Moist heat, Traction, Manual therapy, and  Re-evaluation.  PLAN FOR NEXT SESSION: NuStep, lower extremity strengthening, log rolling, lifting mechanics, manual therapy, modalities as needed  Kathrynn Ducking, PTA 10/31/2022, 10:05 AM

## 2022-11-04 ENCOUNTER — Encounter: Payer: Self-pay | Admitting: Family Medicine

## 2022-11-04 ENCOUNTER — Ambulatory Visit (INDEPENDENT_AMBULATORY_CARE_PROVIDER_SITE_OTHER): Payer: Medicare HMO | Admitting: Family Medicine

## 2022-11-04 VITALS — BP 130/74 | HR 72 | Temp 97.9°F | Ht 69.0 in | Wt 212.2 lb

## 2022-11-04 DIAGNOSIS — M67432 Ganglion, left wrist: Secondary | ICD-10-CM

## 2022-11-04 DIAGNOSIS — G8929 Other chronic pain: Secondary | ICD-10-CM

## 2022-11-04 DIAGNOSIS — B351 Tinea unguium: Secondary | ICD-10-CM | POA: Diagnosis not present

## 2022-11-04 DIAGNOSIS — M5442 Lumbago with sciatica, left side: Secondary | ICD-10-CM | POA: Diagnosis not present

## 2022-11-04 DIAGNOSIS — M25522 Pain in left elbow: Secondary | ICD-10-CM | POA: Diagnosis not present

## 2022-11-04 MED ORDER — MELOXICAM 15 MG PO TABS: 15.0000 mg | ORAL_TABLET | Freq: Every day | ORAL | 1 refills | Status: DC

## 2022-11-04 MED ORDER — TERBINAFINE HCL 250 MG PO TABS: 250.0000 mg | ORAL_TABLET | Freq: Every day | ORAL | 0 refills | Status: DC

## 2022-11-04 MED ORDER — MELOXICAM 15 MG PO TABS: 15.0000 mg | ORAL_TABLET | Freq: Every day | ORAL | 0 refills | Status: DC

## 2022-11-04 NOTE — Progress Notes (Signed)
Established Patient Office Visit  Subjective   [name], male    DOB: [birth year]  Age: 67 y.o. MRN: [id number]  Chief Complaint  Patient presents with   James Davenport is here for a follow up of his chronic back pain.   He has been doing PT for the last 4 week. He has 2 visits left. It has made some improvement in his back pain and his ROM. He has been doing the exercises at home as well. He was able to get a TENS unit and this has helped as well. He did try voltaren but doesn't feel like it helps. He would like to switch back to meloxicam as meloxicam was much more effective. Denies saddle anesthesia, changes in gait, changes in bowel or bladder control.    He has also had some pain in his left elbow and forearm with intermittent numbness and tingling, more so at night. It just noticed a knot of the inside of his elbow as well. Denies erythema or exudate.   He would like to discuss lamisil for his nail fungus. He has had recent labs done with normal liver function.      Review of Systems  Musculoskeletal:  Positive for arthritis.   As per HPI.    Objective:     BP 130/74   Pulse 72   Temp 97.9 F (36.6 C) (Temporal)   Ht '5\' 9"'$  (1.753 m)   Wt 212 lb 4 oz (96.3 kg)   SpO2 93%   BMI 31.34 kg/m    Physical Exam Vitals and nursing note reviewed.  Constitutional:      General: He is not in acute distress.    Appearance: He is not ill-appearing, toxic-appearing or diaphoretic.  Cardiovascular:     Rate and Rhythm: Normal rate and regular rhythm.  Pulmonary:     Effort: Pulmonary effort is normal. No respiratory distress.     Breath sounds: Normal breath sounds.  Musculoskeletal:     Lumbar back: Tenderness present. No swelling, edema, deformity, signs of trauma or bony tenderness.     Right lower leg: No edema.     Left lower leg: No edema.     Comments: Ganglian cyst present on medial left elbow. Mild tenderness. No erythema,  exudate, or warmth. Full ROM.   Feet:     Comments: Bilateral nail fungus presented with thickened toe nails.  Skin:    General: Skin is warm and dry.  Neurological:     General: No focal deficit present.     Mental Status: He is alert and oriented to person, place, and time.  Psychiatric:        Mood and Affect: Mood normal.        Behavior: Behavior normal.      No results found for any visits on 11/04/22.    The 10-year ASCVD risk score (Arnett DK, et al., 2019) is: 14.5%    Assessment & Plan:   James Davenport was seen today for arthritis.  Diagnoses and all orders for this visit:  Chronic midline low back pain with left-sided sciatica Will switch back to mobic as voltaren has not been helping. Reviewed PT notes. Continue PT as scheduled. Follow up for new or worsening symptoms, or if symptoms persist. Discussed MRI and/or ortho referral if needed.  -     Discontinue: meloxicam (MOBIC) 15 MG tablet; Take 1 tablet (15 mg total) by mouth daily. -  meloxicam (MOBIC) 15 MG tablet; Take 1 tablet (15 mg total) by mouth daily. -     meloxicam (MOBIC) 15 MG tablet; Take 1 tablet (15 mg total) by mouth daily.  Left elbow pain Ganglion cyst of wrist, left Mobic prn. Discussed referral to ortho if symptoms worsen or persist.   Onychomycosis Lamisil as below. Recent LFTs were normal.  -     terbinafine (LAMISIL) 250 MG tablet; Take 1 tablet (250 mg total) by mouth daily.   Return in about 3 months (around 02/03/2023) for chronic follow up.   The patient indicates understanding of these issues and agrees with the plan.  Gwenlyn Perking, FNP

## 2022-11-06 ENCOUNTER — Ambulatory Visit: Payer: Medicare HMO

## 2022-11-06 DIAGNOSIS — M5416 Radiculopathy, lumbar region: Secondary | ICD-10-CM | POA: Diagnosis not present

## 2022-11-06 DIAGNOSIS — M6281 Muscle weakness (generalized): Secondary | ICD-10-CM | POA: Diagnosis not present

## 2022-11-06 NOTE — Therapy (Signed)
OUTPATIENT PHYSICAL THERAPY THORACOLUMBAR TREATMENT   Patient Name: James Davenport MRN: [id number] DOB:[birth year], 67 y.o., male Today's Date: 11/06/2022  END OF SESSION:  PT End of Session - 11/06/22 0812     Visit Number 11    Number of Visits 12    Date for PT Re-Evaluation 11/29/22    PT Start Time 0815    PT Stop Time 0900    PT Time Calculation (min) 45 min    Activity Tolerance Patient tolerated treatment well    Behavior During Therapy [facility] for tasks assessed/performed            Past Medical History:  Diagnosis Date   AAA (abdominal aortic aneurysm) without rupture ([place]) 09/19/2021   Aortic atherosclerosis ([place])    Arthritis 11/24/2020   Bilateral carpal tunnel syndrome 11/24/2020   Chronic left shoulder pain 11/24/2020   GERD (gastroesophageal reflux disease) 11/24/2020   Hyperlipidemia LDL goal <100 01/31/2016   Hypertension    Leg cramps 11/24/2020   Past Surgical History:  Procedure Laterality Date   LEG SURGERY Right 1975   Patient Active Problem List   Diagnosis Date Noted   Bee sting allergy 01/23/2022   AAA (abdominal aortic aneurysm) without rupture ([facility]) 09/19/2021   Shortness of breath 08/13/2021   Seasonal allergies 01/09/2021   Seborrheic dermatitis 01/09/2021   History of colon polyps 01/09/2021   Aortic atherosclerosis ([facility])    GERD (gastroesophageal reflux disease) 11/24/2020   Arthritis 11/24/2020   Leg cramps 11/24/2020   Bilateral carpal tunnel syndrome 11/24/2020   Chronic left shoulder pain 11/24/2020   Hyperlipidemia LDL goal <100 01/31/2016   REFERRING PROVIDER: Gwenlyn Perking, FNP   REFERRING DIAG: Chronic midline low back pain with left-sided sciatica   Rationale for Evaluation and Treatment: Rehabilitation  THERAPY DIAG:  Radiculopathy, lumbar region  Muscle weakness (generalized)  ONSET DATE: about 8 years ago   SUBJECTIVE:                                                                                                                                                                                            SUBJECTIVE STATEMENT: Patient reported that he feels better today, but his back still hurts a little.   PERTINENT HISTORY:  Abdominal aortic aneurysm, carpal tunnel, arthritis, and hypertension  PAIN:  Are you having pain? Yes: NPRS scale: 2/10 Pain location: low back Pain description: aggravating   PRECAUTIONS: None  PATIENT GOALS: reduced pain, improved mobility, and resolve his numbness  NEXT MD VISIT: about 6 weeks  OBJECTIVE: all objective information was assessed at his initial evaluation on 09/18/22 unless otherwise noted  DIAGNOSTIC FINDINGS: 09/11/22 Lumbar x-ray IMPRESSION: Mild degenerative joint changes of lower lumbar spine.  LUMBAR ROM:   AROM eval  Flexion 32; limited by pain  Extension 14; limited by pain  Right lateral flexion 50% limited; "feels good"  Left lateral flexion 50% limited; limited by pain  Right rotation 75% limited; limited by pain  Left rotation 50% limited; limited by pain   (Blank rows = not tested)  LOWER EXTREMITY ROM: WFL for activities assessed  LOWER EXTREMITY MMT:    MMT Right eval Left eval  Hip flexion 4-/5; discomfort 3/5; familiar pain  Hip extension    Hip abduction    Hip adduction    Hip internal rotation    Hip external rotation    Knee flexion 4/5; slight pain (L>R) 4-/5; slight pain (L>R)  Knee extension 4-/5 3+/5; discomfort  Ankle dorsiflexion 3+/5 3/5  Ankle plantarflexion    Ankle inversion    Ankle eversion     (Blank rows = not tested)  TODAY'S TREATMENT:                                                                                                                              DATE:                                     1/31 EXERCISE LOG  Exercise Repetitions and Resistance Comments  Nustep  L4 x 20 minutes   Resisted pull down  Blue XTS x 30 reps    Resisted wood chops  Blue XTS x 30 reps each    Lunges onto a step 6" step; 20 reps each        Blank cell = exercise not performed today  Patient was educated on a walking program and his plan of care. He reported understanding.                                    1/25 EXERCISE LOG  Exercise Repetitions and Resistance Comments  Nustep  L4 x 17 minutes   Ball roll out  2 minutes   XTS Pull Down  Blue x 3 minutes   XTS Pull Down with rotation     Multifidus press out with shoulder flexion      Blank cell = exercise not performed today  Modalities  Date:  Unattended Estim: Lumbar, IFC @ 80-150 Hz w/ 40% scan, 10 mins, Pain  Manual Therapy Soft Tissue Mobilization: low back, STW/M to bil lumbar paraspinals to decrease pain and tone with pt in left side-lying for comfort    PATIENT EDUCATION:  Education details: Plan of care, healing, and anatomy Person educated: Patient Education method: Explanation Education comprehension: verbalized understanding  HOME EXERCISE PROGRAM:  ASSESSMENT:  CLINICAL IMPRESSION: Treatment focused on familiar interventions for improved  lumbar strength and stability needed for improved function with his daily activities. He was educated on safely returning to a walking program to avoid aggravating his familiar low back pain. He reported feeling good upon the conclusion of treatment. He continues to require skilled physical therapy to address his remaining impairments to return to his prior level of function.   OBJECTIVE IMPAIRMENTS: Abnormal gait, decreased activity tolerance, decreased mobility, difficulty walking, decreased ROM, decreased strength, hypomobility, impaired sensation, postural dysfunction, and pain.   ACTIVITY LIMITATIONS: carrying, lifting, bending, standing, squatting, sleeping, stairs, transfers, bed mobility, dressing, and locomotion level  PARTICIPATION LIMITATIONS: meal prep, cleaning, laundry, shopping, community activity, and yard work  PERSONAL FACTORS: Time since onset of  injury/illness/exacerbation and 3+ comorbidities: Abdominal aortic aneurysm, carpal tunnel, arthritis, and hypertension  are also affecting patient's functional outcome.   REHAB POTENTIAL: Fair    CLINICAL DECISION MAKING: Unstable/unpredictable  EVALUATION COMPLEXITY: High  GOALS: Goals reviewed with patient? Yes  SHORT TERM GOALS: Target date: 10/09/22  Patient will be independent with his initial HEP. Baseline: Goal status: MET  2.  Patient will be able to stand and walk for at least 20 minutes without being limited by his familiar low back symptoms. Baseline:  Goal status: MET  3.  Patient will be able to complete his daily activities without his familiar pain exceeding 8/10. Baseline:  Goal status: MET  LONG TERM GOALS: Target date: 10/30/22  Patient will be independent with his advanced HEP. Baseline:  Goal status: MET  2.  Patient will be able to walk for at least 30 minutes without being limited by his familiar low back symptoms. Baseline: 1/25: depends on the day Goal status: IN PROGRESS  3.  Patient will be able to complete his daily activities without his familiar low back pain exceeding 6/10. Baseline: 1/25: depends on the day Goal status: IN PROGRESS  4.  Patient will be able to transfer from supine to sitting with minimal to no low back pain. Baseline: 1/25: depends on the day Goal status: IN PROGRESS  PLAN:  PT FREQUENCY: 1-2x/week  PT DURATION: 6 weeks  PLANNED INTERVENTIONS: Therapeutic exercises, Therapeutic activity, Neuromuscular re-education, Balance training, Gait training, Patient/Family education, Self Care, Joint mobilization, Stair training, Dry Needling, Electrical stimulation, Spinal mobilization, Cryotherapy, Moist heat, Traction, Manual therapy, and Re-evaluation.  PLAN FOR NEXT SESSION: NuStep, lower extremity strengthening, log rolling, lifting mechanics, manual therapy, modalities as needed  Darlin Coco, PT 11/06/2022, 12:58 PM

## 2022-11-14 ENCOUNTER — Ambulatory Visit: Payer: Medicare HMO | Attending: Family Medicine

## 2022-11-14 DIAGNOSIS — M6281 Muscle weakness (generalized): Secondary | ICD-10-CM

## 2022-11-14 DIAGNOSIS — M5416 Radiculopathy, lumbar region: Secondary | ICD-10-CM

## 2022-11-14 NOTE — Therapy (Signed)
OUTPATIENT PHYSICAL THERAPY THORACOLUMBAR TREATMENT   Patient Name: James Davenport MRN: [id number] DOB:[birth year], 67 y.o., male Today's Date: 11/14/2022  END OF SESSION:  PT End of Session - 11/14/22 0826     Visit Number 12    Number of Visits 16    Date for PT Re-Evaluation 01/10/23    PT Start Time 0815    PT Stop Time 0930    PT Time Calculation (min) 75 min    Activity Tolerance Patient tolerated treatment well    Behavior During Therapy [facility] for tasks assessed/performed            Past Medical History:  Diagnosis Date   AAA (abdominal aortic aneurysm) without rupture ([place]) 09/19/2021   Aortic atherosclerosis ([place])    Arthritis 11/24/2020   Bilateral carpal tunnel syndrome 11/24/2020   Chronic left shoulder pain 11/24/2020   GERD (gastroesophageal reflux disease) 11/24/2020   Hyperlipidemia LDL goal <100 01/31/2016   Hypertension    Leg cramps 11/24/2020   Past Surgical History:  Procedure Laterality Date   LEG SURGERY Right 1975   Patient Active Problem List   Diagnosis Date Noted   Bee sting allergy 01/23/2022   AAA (abdominal aortic aneurysm) without rupture ([place]) 09/19/2021   Shortness of breath 08/13/2021   Seasonal allergies 01/09/2021   Seborrheic dermatitis 01/09/2021   History of colon polyps 01/09/2021   Aortic atherosclerosis ([facility])    GERD (gastroesophageal reflux disease) 11/24/2020   Arthritis 11/24/2020   Leg cramps 11/24/2020   Bilateral carpal tunnel syndrome 11/24/2020   Chronic left shoulder pain 11/24/2020   Hyperlipidemia LDL goal <100 01/31/2016   REFERRING PROVIDER: Gwenlyn Perking, FNP   REFERRING DIAG: Chronic midline low back pain with left-sided sciatica   Rationale for Evaluation and Treatment: Rehabilitation  THERAPY DIAG:  Radiculopathy, lumbar region  Muscle weakness (generalized)  ONSET DATE: about 8 years ago   SUBJECTIVE:                                                                                                                                                                                            SUBJECTIVE STATEMENT: Patient reports that his back feels alright today. However, he had to bend over to get his phone Monday and that really bothered his back. He experienced increased low back pain into Tuesday after doing this. He feels like his HEP is helping. He has noticed that he has not needed to take his medication as much since he started therapy. He feels that he is about 60-70% back to his prior level of function.   PERTINENT HISTORY:  Abdominal aortic aneurysm, carpal  tunnel, arthritis, and hypertension  PAIN:  Are you having pain? Yes: NPRS scale: 2/10 Pain location: low back Pain description: aggravating   PRECAUTIONS: None  PATIENT GOALS: reduced pain, improved mobility, and resolve his numbness  NEXT MD VISIT: about 6 weeks  OBJECTIVE: all objective information was assessed at his initial evaluation on 09/18/22 unless otherwise noted  DIAGNOSTIC FINDINGS: 09/11/22 Lumbar x-ray IMPRESSION: Mild degenerative joint changes of lower lumbar spine.  LUMBAR ROM:   AROM eval 11/14/22  Flexion 32; limited by pain 50; pain at end range  Extension 14; limited by pain 20; pain at end range  Right lateral flexion 50% limited; "feels good" 50% limited; "I feel it"    Left lateral flexion 50% limited; limited by pain 50% limited; "I feel it more than the right"   Right rotation 75% limited; limited by pain 25% limited; nonpainful  Left rotation 50% limited; limited by pain 25% limited; "I feel it"    (Blank rows = not tested)  LOWER EXTREMITY ROM: WFL for activities assessed  LOWER EXTREMITY MMT:    MMT Right eval Left eval  Hip flexion 4-/5; discomfort 3/5; familiar pain  Hip extension    Hip abduction    Hip adduction    Hip internal rotation    Hip external rotation    Knee flexion 4/5; slight pain (L>R) 4-/5; slight pain (L>R)  Knee extension 4-/5 3+/5; discomfort   Ankle dorsiflexion 3+/5 3/5  Ankle plantarflexion    Ankle inversion    Ankle eversion     (Blank rows = not tested)  TODAY'S TREATMENT:                                                                                                                              DATE:                                     2/8 EXERCISE LOG  Exercise Repetitions and Resistance Comments  Nustep  L5 x 18 minutes   Cybex knee extension 10# x 3 minutes   Cybex knee flexion  30# x 3 minutes    Resisted pull down  Blue XTS x 30 reps    Resisted wood chop  Blue XTS x 30 reps each    Cybex leg press  1 plate; seat 7 x 3 minutes    Blank cell = exercise not performed today  Patient was educated on safely beginning a walking program, plan of care, his progress with therapy, MRI testing, x-ray results, and benefits of continued exercise. He reported understanding and feeling comfortable starting to walk more.                                    1/31 EXERCISE LOG  Exercise Repetitions and Resistance Comments  Nustep  L4 x 20 minutes   Resisted pull down  Blue XTS x 30 reps    Resisted wood chops  Blue XTS x 30 reps each   Lunges onto a step 6" step; 20 reps each        Blank cell = exercise not performed today  Patient was educated on a walking program and his plan of care. He reported understanding.                                    1/25 EXERCISE LOG  Exercise Repetitions and Resistance Comments  Nustep  L4 x 17 minutes   Ball roll out  2 minutes   XTS Pull Down  Blue x 3 minutes   XTS Pull Down with rotation     Multifidus press out with shoulder flexion      Blank cell = exercise not performed today  Modalities  Date:  Unattended Estim: Lumbar, IFC @ 80-150 Hz w/ 40% scan, 10 mins, Pain  Manual Therapy Soft Tissue Mobilization: low back, STW/M to bil lumbar paraspinals to decrease pain and tone with pt in left side-lying for comfort    PATIENT EDUCATION:  Education details: Plan of care,  healing, and anatomy Person educated: Patient Education method: Explanation Education comprehension: verbalized understanding  HOME EXERCISE PROGRAM:  ASSESSMENT:  CLINICAL IMPRESSION: Patient continues to make good progress with skilled physical therapy as evidenced by his subjective reports, objective measures, functional mobility, and progress toward his goals. He has nearly met all of his goals at this time. However, he continues to experience increased pain with daily activities such as picking up items off the floor. He was introduced to resisted leg press for improved lower lumbar stability and lower extremity strength. He required minimal cueing with this activity for proper pacing with this intervention. He experienced a mild increase in discomfort with this intervention, but this did not inhibit his ability to complete this activity. He was educated on safely walking and exercising at home. He reported feeling comfortable beginning to walk for exercise. Recommend that he continue for four additional visits to maximize his functional mobility.   OBJECTIVE IMPAIRMENTS: Abnormal gait, decreased activity tolerance, decreased mobility, difficulty walking, decreased ROM, decreased strength, hypomobility, impaired sensation, postural dysfunction, and pain.   ACTIVITY LIMITATIONS: carrying, lifting, bending, standing, squatting, sleeping, stairs, transfers, bed mobility, dressing, and locomotion level  PARTICIPATION LIMITATIONS: meal prep, cleaning, laundry, shopping, community activity, and yard work  PERSONAL FACTORS: Time since onset of injury/illness/exacerbation and 3+ comorbidities: Abdominal aortic aneurysm, carpal tunnel, arthritis, and hypertension  are also affecting patient's functional outcome.   REHAB POTENTIAL: Fair    CLINICAL DECISION MAKING: Unstable/unpredictable  EVALUATION COMPLEXITY: High  GOALS: Goals reviewed with patient? Yes  SHORT TERM GOALS: Target date:  10/09/22  Patient will be independent with his initial HEP. Baseline: Goal status: MET  2.  Patient will be able to stand and walk for at least 20 minutes without being limited by his familiar low back symptoms. Baseline:  Goal status: MET  3.  Patient will be able to complete his daily activities without his familiar pain exceeding 8/10. Baseline:  Goal status: MET  LONG TERM GOALS: Target date: 10/30/22  Patient will be independent with his advanced HEP. Baseline:  Goal status: MET  2.  Patient will be able to walk for at least 30 minutes without being limited by his  familiar low back symptoms. Baseline:11/14/22: 25-30 minutes at most Goal status: IN PROGRESS  3.  Patient will be able to complete his daily activities without his familiar low back pain exceeding 6/10. Baseline: 11/14/22: still has some acute flare ups  Goal status: IN PROGRESS  4.  Patient will be able to transfer from supine to sitting with minimal to no low back pain. Baseline: able to do that sometimes, but not as bad Goal status: IN PROGRESS  PLAN:  PT FREQUENCY: 1x/week  PT DURATION: 4 weeks  PLANNED INTERVENTIONS: Therapeutic exercises, Therapeutic activity, Neuromuscular re-education, Balance training, Gait training, Patient/Family education, Self Care, Joint mobilization, Stair training, Dry Needling, Electrical stimulation, Spinal mobilization, Cryotherapy, Moist heat, Traction, Manual therapy, and Re-evaluation.  PLAN FOR NEXT SESSION: NuStep, lower extremity strengthening, log rolling, lifting mechanics, manual therapy, modalities as needed  Darlin Coco, PT 11/14/2022, 9:53 AM

## 2022-11-19 ENCOUNTER — Ambulatory Visit: Payer: Medicare HMO

## 2022-11-19 ENCOUNTER — Other Ambulatory Visit: Payer: Self-pay | Admitting: Family Medicine

## 2022-11-19 DIAGNOSIS — M6281 Muscle weakness (generalized): Secondary | ICD-10-CM

## 2022-11-19 DIAGNOSIS — M5416 Radiculopathy, lumbar region: Secondary | ICD-10-CM

## 2022-11-19 DIAGNOSIS — R252 Cramp and spasm: Secondary | ICD-10-CM

## 2022-11-19 NOTE — Therapy (Signed)
OUTPATIENT PHYSICAL THERAPY THORACOLUMBAR TREATMENT   Patient Name: James Davenport MRN: [id number] DOB:1955/12/15, 67 y.o., male Today's Date: 11/19/2022  END OF SESSION:  PT End of Session - 11/19/22 0833     Visit Number 13    Number of Visits 16    Date for PT Re-Evaluation 01/10/23    PT Start Time 0815    PT Stop Time [id number]    PT Time Calculation (min) 50 min    Activity Tolerance Patient tolerated treatment well    Behavior During Therapy [facility] for tasks assessed/performed            Past Medical History:  Diagnosis Date   AAA (abdominal aortic aneurysm) without rupture ([place]) 09/19/2021   Aortic atherosclerosis ([place])    Arthritis 11/24/2020   Bilateral carpal tunnel syndrome 11/24/2020   Chronic left shoulder pain 11/24/2020   GERD (gastroesophageal reflux disease) 11/24/2020   Hyperlipidemia LDL goal <100 01/31/2016   Hypertension    Leg cramps 11/24/2020   Past Surgical History:  Procedure Laterality Date   LEG SURGERY Right 1975   Patient Active Problem List   Diagnosis Date Noted   Bee sting allergy 01/23/2022   AAA (abdominal aortic aneurysm) without rupture ([place]) 09/19/2021   Shortness of breath 08/13/2021   Seasonal allergies 01/09/2021   Seborrheic dermatitis 01/09/2021   History of colon polyps 01/09/2021   Aortic atherosclerosis ([facility])    GERD (gastroesophageal reflux disease) 11/24/2020   Arthritis 11/24/2020   Leg cramps 11/24/2020   Bilateral carpal tunnel syndrome 11/24/2020   Chronic left shoulder pain 11/24/2020   Hyperlipidemia LDL goal <100 01/31/2016   REFERRING PROVIDER: Gwenlyn Perking, FNP   REFERRING DIAG: Chronic midline low back pain with left-sided sciatica   Rationale for Evaluation and Treatment: Rehabilitation  THERAPY DIAG:  Radiculopathy, lumbar region  Muscle weakness (generalized)  ONSET DATE: about 8 years ago   SUBJECTIVE:                                                                                                                                                                                            SUBJECTIVE STATEMENT: Patient reports that his abdomen was really sore after his last appointment, but it feels better today.   PERTINENT HISTORY:  Abdominal aortic aneurysm, carpal tunnel, arthritis, and hypertension  PAIN:  Are you having pain? Yes: NPRS scale: none provided/10 Pain location: low back Pain description: aggravating   PRECAUTIONS: None  PATIENT GOALS: reduced pain, improved mobility, and resolve his numbness  NEXT MD VISIT: about 6 weeks  OBJECTIVE: all objective information was assessed at his initial evaluation on 09/18/22  unless otherwise noted  DIAGNOSTIC FINDINGS: 09/11/22 Lumbar x-ray IMPRESSION: Mild degenerative joint changes of lower lumbar spine.  LUMBAR ROM:   AROM eval 11/14/22  Flexion 32; limited by pain 50; pain at end range  Extension 14; limited by pain 20; pain at end range  Right lateral flexion 50% limited; "feels good" 50% limited; "I feel it"    Left lateral flexion 50% limited; limited by pain 50% limited; "I feel it more than the right"   Right rotation 75% limited; limited by pain 25% limited; nonpainful  Left rotation 50% limited; limited by pain 25% limited; "I feel it"    (Blank rows = not tested)  LOWER EXTREMITY ROM: WFL for activities assessed  LOWER EXTREMITY MMT:    MMT Right eval Left eval  Hip flexion 4-/5; discomfort 3/5; familiar pain  Hip extension    Hip abduction    Hip adduction    Hip internal rotation    Hip external rotation    Knee flexion 4/5; slight pain (L>R) 4-/5; slight pain (L>R)  Knee extension 4-/5 3+/5; discomfort  Ankle dorsiflexion 3+/5 3/5  Ankle plantarflexion    Ankle inversion    Ankle eversion     (Blank rows = not tested)  TODAY'S TREATMENT:                                                                                                                              DATE:                                      2/13 EXERCISE LOG  Exercise Repetitions and Resistance Comments  Nustep  L4 x 20 minutes   Resisted pull down with rotation  Blue XTS x 3 minutes   Cybex knee extension 20# x 3 minutes   Cybex knee flexion  40# x 3 minutes   Tandem walking on foam  3 minutes  Intermittent UE support  BOSU step ups  30 reps     Blank cell = exercise not performed today                                    2/8 EXERCISE LOG  Exercise Repetitions and Resistance Comments  Nustep  L5 x 18 minutes   Cybex knee extension 10# x 3 minutes   Cybex knee flexion  30# x 3 minutes    Resisted pull down  Blue XTS x 30 reps    Resisted wood chop  Blue XTS x 30 reps each    Cybex leg press  1 plate; seat 7 x 3 minutes    Blank cell = exercise not performed today  Patient was educated on safely beginning a walking program, plan of care, his progress with therapy, MRI testing, x-ray results, and benefits  of continued exercise. He reported understanding and feeling comfortable starting to walk more.                                    1/31 EXERCISE LOG  Exercise Repetitions and Resistance Comments  Nustep  L4 x 20 minutes   Resisted pull down  Blue XTS x 30 reps    Resisted wood chops  Blue XTS x 30 reps each   Lunges onto a step 6" step; 20 reps each        Blank cell = exercise not performed today  Patient was educated on a walking program and his plan of care. He reported understanding.   PATIENT EDUCATION:  Education details: Plan of care, healing, and anatomy Person educated: Patient Education method: Explanation Education comprehension: verbalized understanding  HOME EXERCISE PROGRAM:  ASSESSMENT:  CLINICAL IMPRESSION: Treatment focused on familiar interventions for improved lumbar and lower extremity strength with moderate difficulty. He experienced the most difficulty with BOSU step ups as evidenced by his increased instability and his needed for intermittent upper extremity  support. He experienced a mild increase in low back discomfort with this intervention when leading with the left upper extremity. However, this did not limit his ability to complete this intervention. He reported that he felt a little tired, but was not hurting upon the conclusion of treatment. He continues to require skilled physical therapy to address his remaining impairments to return to his prior level of function.   OBJECTIVE IMPAIRMENTS: Abnormal gait, decreased activity tolerance, decreased mobility, difficulty walking, decreased ROM, decreased strength, hypomobility, impaired sensation, postural dysfunction, and pain.   ACTIVITY LIMITATIONS: carrying, lifting, bending, standing, squatting, sleeping, stairs, transfers, bed mobility, dressing, and locomotion level  PARTICIPATION LIMITATIONS: meal prep, cleaning, laundry, shopping, community activity, and yard work  PERSONAL FACTORS: Time since onset of injury/illness/exacerbation and 3+ comorbidities: Abdominal aortic aneurysm, carpal tunnel, arthritis, and hypertension  are also affecting patient's functional outcome.   REHAB POTENTIAL: Fair    CLINICAL DECISION MAKING: Unstable/unpredictable  EVALUATION COMPLEXITY: High  GOALS: Goals reviewed with patient? Yes  SHORT TERM GOALS: Target date: 10/09/22  Patient will be independent with his initial HEP. Baseline: Goal status: MET  2.  Patient will be able to stand and walk for at least 20 minutes without being limited by his familiar low back symptoms. Baseline:  Goal status: MET  3.  Patient will be able to complete his daily activities without his familiar pain exceeding 8/10. Baseline:  Goal status: MET  LONG TERM GOALS: Target date: 10/30/22  Patient will be independent with his advanced HEP. Baseline:  Goal status: MET  2.  Patient will be able to walk for at least 30 minutes without being limited by his familiar low back symptoms. Baseline:11/14/22: 25-30 minutes at  most Goal status: IN PROGRESS  3.  Patient will be able to complete his daily activities without his familiar low back pain exceeding 6/10. Baseline: 11/14/22: still has some acute flare ups  Goal status: IN PROGRESS  4.  Patient will be able to transfer from supine to sitting with minimal to no low back pain. Baseline: able to do that sometimes, but not as bad Goal status: IN PROGRESS  PLAN:  PT FREQUENCY: 1x/week  PT DURATION: 4 weeks  PLANNED INTERVENTIONS: Therapeutic exercises, Therapeutic activity, Neuromuscular re-education, Balance training, Gait training, Patient/Family education, Self Care, Joint mobilization, Stair training, Dry Needling, Electrical  stimulation, Spinal mobilization, Cryotherapy, Moist heat, Traction, Manual therapy, and Re-evaluation.  PLAN FOR NEXT SESSION: NuStep, lower extremity strengthening, log rolling, lifting mechanics, manual therapy, modalities as needed  Darlin Coco, PT 11/19/2022, 9:25 AM

## 2022-11-21 ENCOUNTER — Other Ambulatory Visit: Payer: Self-pay | Admitting: Family Medicine

## 2022-11-21 DIAGNOSIS — L219 Seborrheic dermatitis, unspecified: Secondary | ICD-10-CM

## 2022-11-28 ENCOUNTER — Ambulatory Visit: Payer: Medicare HMO

## 2022-11-28 DIAGNOSIS — M6281 Muscle weakness (generalized): Secondary | ICD-10-CM

## 2022-11-28 DIAGNOSIS — M5416 Radiculopathy, lumbar region: Secondary | ICD-10-CM

## 2022-11-28 NOTE — Therapy (Signed)
OUTPATIENT PHYSICAL THERAPY THORACOLUMBAR TREATMENT   Patient Name: James Davenport MRN: [id number] DOB:[birth year], 67 y.o., male Today's Date: 11/28/2022  END OF SESSION:  PT End of Session - 11/28/22 0823     Visit Number 14    Number of Visits 16    Date for PT Re-Evaluation 01/10/23    PT Start Time 0815    PT Stop Time 0900    PT Time Calculation (min) 45 min    Activity Tolerance Patient tolerated treatment well    Behavior During Therapy [facility] for tasks assessed/performed            Past Medical History:  Diagnosis Date   AAA (abdominal aortic aneurysm) without rupture ([place]) 09/19/2021   Aortic atherosclerosis ([place])    Arthritis 11/24/2020   Bilateral carpal tunnel syndrome 11/24/2020   Chronic left shoulder pain 11/24/2020   GERD (gastroesophageal reflux disease) 11/24/2020   Hyperlipidemia LDL goal <100 01/31/2016   Hypertension    Leg cramps 11/24/2020   Past Surgical History:  Procedure Laterality Date   LEG SURGERY Right 1975   Patient Active Problem List   Diagnosis Date Noted   Bee sting allergy 01/23/2022   AAA (abdominal aortic aneurysm) without rupture ([facility]) 09/19/2021   Shortness of breath 08/13/2021   Seasonal allergies 01/09/2021   Seborrheic dermatitis 01/09/2021   History of colon polyps 01/09/2021   Aortic atherosclerosis ([facility])    GERD (gastroesophageal reflux disease) 11/24/2020   Arthritis 11/24/2020   Leg cramps 11/24/2020   Bilateral carpal tunnel syndrome 11/24/2020   Chronic left shoulder pain 11/24/2020   Hyperlipidemia LDL goal <100 01/31/2016   REFERRING PROVIDER: Gwenlyn Perking, FNP   REFERRING DIAG: Chronic midline low back pain with left-sided sciatica   Rationale for Evaluation and Treatment: Rehabilitation  THERAPY DIAG:  Radiculopathy, lumbar region  Muscle weakness (generalized)  ONSET DATE: about 8 years ago   SUBJECTIVE:                                                                                                                                                                                            SUBJECTIVE STATEMENT: Patient reports that he feels alright today. However, he was going down stairs the other day and his back hurt for two days afterward. He is unsure what happened to cause this increase in pain.   PERTINENT HISTORY:  Abdominal aortic aneurysm, carpal tunnel, arthritis, and hypertension  PAIN:  Are you having pain? Yes: NPRS scale: none provided/10 Pain location: low back Pain description: aggravating   PRECAUTIONS: None  PATIENT GOALS: reduced pain, improved mobility, and resolve his numbness  NEXT  MD VISIT: about 6 weeks  OBJECTIVE: all objective information was assessed at his initial evaluation on 09/18/22 unless otherwise noted  DIAGNOSTIC FINDINGS: 09/11/22 Lumbar x-ray IMPRESSION: Mild degenerative joint changes of lower lumbar spine.  LUMBAR ROM:   AROM eval 11/14/22  Flexion 32; limited by pain 50; pain at end range  Extension 14; limited by pain 20; pain at end range  Right lateral flexion 50% limited; "feels good" 50% limited; "I feel it"    Left lateral flexion 50% limited; limited by pain 50% limited; "I feel it more than the right"   Right rotation 75% limited; limited by pain 25% limited; nonpainful  Left rotation 50% limited; limited by pain 25% limited; "I feel it"    (Blank rows = not tested)  LOWER EXTREMITY ROM: WFL for activities assessed  LOWER EXTREMITY MMT:    MMT Right eval Left eval  Hip flexion 4-/5; discomfort 3/5; familiar pain  Hip extension    Hip abduction    Hip adduction    Hip internal rotation    Hip external rotation    Knee flexion 4/5; slight pain (L>R) 4-/5; slight pain (L>R)  Knee extension 4-/5 3+/5; discomfort  Ankle dorsiflexion 3+/5 3/5  Ankle plantarflexion    Ankle inversion    Ankle eversion     (Blank rows = not tested)  TODAY'S TREATMENT:                                                                                                                               DATE:                                     2/22 EXERCISE LOG  Exercise Repetitions and Resistance Comments  Nustep  L4-5 x 17 minutes   Lateral step up  6" step x 15 reps each    Eccentric heel tap  4" step x 15 reps each    Self lumbar joint mobilization  With belt       Blank cell = exercise not performed today  Manual Therapy Soft Tissue Mobilization: bilateral lumbar paraspinals and QL, for reduced pain and tone Joint Mobilizations: L3-5 CPA's , grade I-III                                     2/13 EXERCISE LOG  Exercise Repetitions and Resistance Comments  Nustep  L4 x 20 minutes   Resisted pull down with rotation  Blue XTS x 3 minutes   Cybex knee extension 20# x 3 minutes   Cybex knee flexion  40# x 3 minutes   Tandem walking on foam  3 minutes  Intermittent UE support  BOSU step ups  30 reps     Blank cell = exercise not performed today  2/8 EXERCISE LOG  Exercise Repetitions and Resistance Comments  Nustep  L5 x 18 minutes   Cybex knee extension 10# x 3 minutes   Cybex knee flexion  30# x 3 minutes    Resisted pull down  Blue XTS x 30 reps    Resisted wood chop  Blue XTS x 30 reps each    Cybex leg press  1 plate; seat 7 x 3 minutes    Blank cell = exercise not performed today  Patient was educated on safely beginning a walking program, plan of care, his progress with therapy, MRI testing, x-ray results, and benefits of continued exercise. He reported understanding and feeling comfortable starting to walk more.   PATIENT EDUCATION:  Education details: Plan of care, healing, and anatomy Person educated: Patient Education method: Explanation Education comprehension: verbalized understanding  HOME EXERCISE PROGRAM:  ASSESSMENT:  CLINICAL IMPRESSION: Treatment focused on new and familiar interventions for improved function descending stairs. He required minimal  cueing with self lumbar joint mobilizations for appropriate mechanics to facilitate lumbar joint mobility without increasing his familiar pain. Manual therapy focused on lumbar joint mobilizations and soft tissue mobilization to the surrounding musculature for reduced pain and tone with moderate effectiveness. He reported feeling better upon the conclusion of treatment. He continues to require skilled physical therapy to address his remaining impairments to maximize his functional mobility.   OBJECTIVE IMPAIRMENTS: Abnormal gait, decreased activity tolerance, decreased mobility, difficulty walking, decreased ROM, decreased strength, hypomobility, impaired sensation, postural dysfunction, and pain.   ACTIVITY LIMITATIONS: carrying, lifting, bending, standing, squatting, sleeping, stairs, transfers, bed mobility, dressing, and locomotion level  PARTICIPATION LIMITATIONS: meal prep, cleaning, laundry, shopping, community activity, and yard work  PERSONAL FACTORS: Time since onset of injury/illness/exacerbation and 3+ comorbidities: Abdominal aortic aneurysm, carpal tunnel, arthritis, and hypertension  are also affecting patient's functional outcome.   REHAB POTENTIAL: Fair    CLINICAL DECISION MAKING: Unstable/unpredictable  EVALUATION COMPLEXITY: High  GOALS: Goals reviewed with patient? Yes  SHORT TERM GOALS: Target date: 10/09/22  Patient will be independent with his initial HEP. Baseline: Goal status: MET  2.  Patient will be able to stand and walk for at least 20 minutes without being limited by his familiar low back symptoms. Baseline:  Goal status: MET  3.  Patient will be able to complete his daily activities without his familiar pain exceeding 8/10. Baseline:  Goal status: MET  LONG TERM GOALS: Target date: 10/30/22  Patient will be independent with his advanced HEP. Baseline:  Goal status: MET  2.  Patient will be able to walk for at least 30 minutes without being limited  by his familiar low back symptoms. Baseline:11/14/22: 25-30 minutes at most Goal status: IN PROGRESS  3.  Patient will be able to complete his daily activities without his familiar low back pain exceeding 6/10. Baseline: 11/14/22: still has some acute flare ups  Goal status: IN PROGRESS  4.  Patient will be able to transfer from supine to sitting with minimal to no low back pain. Baseline: able to do that sometimes, but not as bad Goal status: IN PROGRESS  PLAN:  PT FREQUENCY: 1x/week  PT DURATION: 4 weeks  PLANNED INTERVENTIONS: Therapeutic exercises, Therapeutic activity, Neuromuscular re-education, Balance training, Gait training, Patient/Family education, Self Care, Joint mobilization, Stair training, Dry Needling, Electrical stimulation, Spinal mobilization, Cryotherapy, Moist heat, Traction, Manual therapy, and Re-evaluation.  PLAN FOR NEXT SESSION: NuStep, lower extremity strengthening, log rolling, lifting mechanics, manual therapy, modalities as needed  Jacki Cones  Toya Smothers, PT 11/28/2022, 1:47 PM

## 2022-12-05 ENCOUNTER — Encounter: Payer: Self-pay | Admitting: Radiology

## 2022-12-10 ENCOUNTER — Ambulatory Visit: Payer: Medicare HMO | Attending: Family Medicine

## 2022-12-10 DIAGNOSIS — M5416 Radiculopathy, lumbar region: Secondary | ICD-10-CM | POA: Insufficient documentation

## 2022-12-10 DIAGNOSIS — M6281 Muscle weakness (generalized): Secondary | ICD-10-CM | POA: Diagnosis not present

## 2022-12-10 NOTE — Therapy (Signed)
OUTPATIENT PHYSICAL THERAPY THORACOLUMBAR TREATMENT   Patient Name: James Davenport MRN: [id number] DOB:1955-12-03, 67 y.o., male Today's Date: 12/10/2022  END OF SESSION:  PT End of Session - 12/10/22 0813     Visit Number 15    Number of Visits 16    Date for PT Re-Evaluation 01/10/23    PT Start Time 0815    PT Stop Time 0907    PT Time Calculation (min) 52 min    Activity Tolerance Patient tolerated treatment well    Behavior During Therapy [facility] for tasks assessed/performed             Past Medical History:  Diagnosis Date   AAA (abdominal aortic aneurysm) without rupture ([place]) 09/19/2021   Aortic atherosclerosis ([place])    Arthritis 11/24/2020   Bilateral carpal tunnel syndrome 11/24/2020   Chronic left shoulder pain 11/24/2020   GERD (gastroesophageal reflux disease) 11/24/2020   Hyperlipidemia LDL goal <100 01/31/2016   Hypertension    Leg cramps 11/24/2020   Past Surgical History:  Procedure Laterality Date   LEG SURGERY Right 1975   Patient Active Problem List   Diagnosis Date Noted   Bee sting allergy 01/23/2022   AAA (abdominal aortic aneurysm) without rupture ([place]) 09/19/2021   Shortness of breath 08/13/2021   Seasonal allergies 01/09/2021   Seborrheic dermatitis 01/09/2021   History of colon polyps 01/09/2021   Aortic atherosclerosis (HCC)    GERD (gastroesophageal reflux disease) 11/24/2020   Arthritis 11/24/2020   Leg cramps 11/24/2020   Bilateral carpal tunnel syndrome 11/24/2020   Chronic left shoulder pain 11/24/2020   Hyperlipidemia LDL goal <100 01/31/2016   REFERRING PROVIDER: Gwenlyn Perking, FNP   REFERRING DIAG: Chronic midline low back pain with left-sided sciatica   Rationale for Evaluation and Treatment: Rehabilitation  THERAPY DIAG:  Radiculopathy, lumbar region  Muscle weakness (generalized)  ONSET DATE: about 8 years ago   SUBJECTIVE:                                                                                                                                                                                            SUBJECTIVE STATEMENT: Patient reports that his back is bothering him a little, but it is a lot better than it was prior to therapy. He is happy with how much he has improved since starting therapy.   PERTINENT HISTORY:  Abdominal aortic aneurysm, carpal tunnel, arthritis, and hypertension  PAIN:  Are you having pain? Yes: NPRS scale: none provided/10 Pain location: low back Pain description: aggravating   PRECAUTIONS: None  PATIENT GOALS: reduced pain, improved mobility, and resolve his numbness  NEXT  MD VISIT: about 6 weeks  OBJECTIVE: all objective information was assessed at his initial evaluation on 09/18/22 unless otherwise noted  DIAGNOSTIC FINDINGS: 09/11/22 Lumbar x-ray IMPRESSION: Mild degenerative joint changes of lower lumbar spine.  LUMBAR ROM:   AROM eval 11/14/22  Flexion 32; limited by pain 50; pain at end range  Extension 14; limited by pain 20; pain at end range  Right lateral flexion 50% limited; "feels good" 50% limited; "I feel it"    Left lateral flexion 50% limited; limited by pain 50% limited; "I feel it more than the right"   Right rotation 75% limited; limited by pain 25% limited; nonpainful  Left rotation 50% limited; limited by pain 25% limited; "I feel it"    (Blank rows = not tested)  LOWER EXTREMITY ROM: WFL for activities assessed  LOWER EXTREMITY MMT:    MMT Right eval Left eval  Hip flexion 4-/5; discomfort 3/5; familiar pain  Hip extension    Hip abduction    Hip adduction    Hip internal rotation    Hip external rotation    Knee flexion 4/5; slight pain (L>R) 4-/5; slight pain (L>R)  Knee extension 4-/5 3+/5; discomfort  Ankle dorsiflexion 3+/5 3/5  Ankle plantarflexion    Ankle inversion    Ankle eversion     (Blank rows = not tested)  TODAY'S TREATMENT:                                                                                                                               DATE:                                     3/5 EXERCISE LOG  Exercise Repetitions and Resistance Comments  Nustep  L5 x 20 minutes   Rocker board  5 minutes Intermittent UE support  BOSU lateral controls  3 minutes BUE support  Standing hip extension 30 reps each        Blank cell = exercise not performed today                                    2/22 EXERCISE LOG  Exercise Repetitions and Resistance Comments  Nustep  L4-5 x 17 minutes   Lateral step up  6" step x 15 reps each    Eccentric heel tap  4" step x 15 reps each    Self lumbar joint mobilization  With belt       Blank cell = exercise not performed today  Manual Therapy Soft Tissue Mobilization: bilateral lumbar paraspinals and QL, for reduced pain and tone Joint Mobilizations: L3-5 CPA's , grade I-III  2/13 EXERCISE LOG  Exercise Repetitions and Resistance Comments  Nustep  L4 x 20 minutes   Resisted pull down with rotation  Blue XTS x 3 minutes   Cybex knee extension 20# x 3 minutes   Cybex knee flexion  40# x 3 minutes   Tandem walking on foam  3 minutes  Intermittent UE support  BOSU step ups  30 reps     Blank cell = exercise not performed today   PATIENT EDUCATION:  Education details: Plan of care, healing, and anatomy Person educated: Patient Education method: Explanation Education comprehension: verbalized understanding  HOME EXERCISE PROGRAM:  ASSESSMENT:  CLINICAL IMPRESSION: Treatment focused on improved lumbar and lower extremity stability through the use of new and familiar interventions. He required minimal cueing with today's interventions for proper exercise performance. He was educated on his plan of care, progress with therapy, a walking program, and the benefits of continued exercise after his discharge from therapy. He reported understanding and he felt comfortable being discharged after his next appointment.  He reported feeling "no better or worse" upon the conclusion of treatment. He will be discharged at his next appointment with a HEP barring any setbacks.   OBJECTIVE IMPAIRMENTS: Abnormal gait, decreased activity tolerance, decreased mobility, difficulty walking, decreased ROM, decreased strength, hypomobility, impaired sensation, postural dysfunction, and pain.   ACTIVITY LIMITATIONS: carrying, lifting, bending, standing, squatting, sleeping, stairs, transfers, bed mobility, dressing, and locomotion level  PARTICIPATION LIMITATIONS: meal prep, cleaning, laundry, shopping, community activity, and yard work  PERSONAL FACTORS: Time since onset of injury/illness/exacerbation and 3+ comorbidities: Abdominal aortic aneurysm, carpal tunnel, arthritis, and hypertension  are also affecting patient's functional outcome.   REHAB POTENTIAL: Fair    CLINICAL DECISION MAKING: Unstable/unpredictable  EVALUATION COMPLEXITY: High  GOALS: Goals reviewed with patient? Yes  SHORT TERM GOALS: Target date: 10/09/22  Patient will be independent with his initial HEP. Baseline: Goal status: MET  2.  Patient will be able to stand and walk for at least 20 minutes without being limited by his familiar low back symptoms. Baseline:  Goal status: MET  3.  Patient will be able to complete his daily activities without his familiar pain exceeding 8/10. Baseline:  Goal status: MET  LONG TERM GOALS: Target date: 10/30/22  Patient will be independent with his advanced HEP. Baseline:  Goal status: MET  2.  Patient will be able to walk for at least 30 minutes without being limited by his familiar low back symptoms. Baseline:11/14/22: 25-30 minutes at most Goal status: IN PROGRESS  3.  Patient will be able to complete his daily activities without his familiar low back pain exceeding 6/10. Baseline: 11/14/22: still has some acute flare ups  Goal status: IN PROGRESS  4.  Patient will be able to transfer from supine  to sitting with minimal to no low back pain. Baseline: able to do that sometimes, but not as bad Goal status: IN PROGRESS  PLAN:  PT FREQUENCY: 1x/week  PT DURATION: 4 weeks  PLANNED INTERVENTIONS: Therapeutic exercises, Therapeutic activity, Neuromuscular re-education, Balance training, Gait training, Patient/Family education, Self Care, Joint mobilization, Stair training, Dry Needling, Electrical stimulation, Spinal mobilization, Cryotherapy, Moist heat, Traction, Manual therapy, and Re-evaluation.  PLAN FOR NEXT SESSION: NuStep, lower extremity strengthening, log rolling, lifting mechanics, manual therapy, modalities as needed  Darlin Coco, PT 12/10/2022, 9:18 AM

## 2022-12-14 ENCOUNTER — Other Ambulatory Visit: Payer: Self-pay | Admitting: Family Medicine

## 2022-12-14 DIAGNOSIS — L219 Seborrheic dermatitis, unspecified: Secondary | ICD-10-CM

## 2022-12-16 NOTE — Telephone Encounter (Signed)
James Davenport patient Last office visit 11/04/22 Last refill 11/21/22, #120 ml, no refills

## 2022-12-17 DIAGNOSIS — L219 Seborrheic dermatitis, unspecified: Secondary | ICD-10-CM | POA: Diagnosis not present

## 2022-12-17 DIAGNOSIS — L570 Actinic keratosis: Secondary | ICD-10-CM | POA: Diagnosis not present

## 2022-12-17 DIAGNOSIS — D1722 Benign lipomatous neoplasm of skin and subcutaneous tissue of left arm: Secondary | ICD-10-CM | POA: Diagnosis not present

## 2022-12-17 DIAGNOSIS — D485 Neoplasm of uncertain behavior of skin: Secondary | ICD-10-CM | POA: Diagnosis not present

## 2022-12-24 ENCOUNTER — Ambulatory Visit: Payer: Medicare HMO

## 2022-12-24 DIAGNOSIS — M5416 Radiculopathy, lumbar region: Secondary | ICD-10-CM | POA: Diagnosis not present

## 2022-12-24 DIAGNOSIS — M6281 Muscle weakness (generalized): Secondary | ICD-10-CM | POA: Diagnosis not present

## 2022-12-24 NOTE — Therapy (Signed)
OUTPATIENT PHYSICAL THERAPY THORACOLUMBAR TREATMENT   Patient Name: James Davenport MRN: [id number] DOB:Oct 04, 1956, 67 y.o., male Today's Date: 12/24/2022  END OF SESSION:  PT End of Session - 12/24/22 0825     Visit Number 16    Number of Visits 16    Date for PT Re-Evaluation 01/10/23    PT Start Time 0815    PT Stop Time 0902    PT Time Calculation (min) 47 min    Activity Tolerance Patient tolerated treatment well    Behavior During Therapy [facility] for tasks assessed/performed             Past Medical History:  Diagnosis Date   AAA (abdominal aortic aneurysm) without rupture ([place]) 09/19/2021   Aortic atherosclerosis ([place])    Arthritis 11/24/2020   Bilateral carpal tunnel syndrome 11/24/2020   Chronic left shoulder pain 11/24/2020   GERD (gastroesophageal reflux disease) 11/24/2020   Hyperlipidemia LDL goal <100 01/31/2016   Hypertension    Leg cramps 11/24/2020   Past Surgical History:  Procedure Laterality Date   LEG SURGERY Right 1975   Patient Active Problem List   Diagnosis Date Noted   Bee sting allergy 01/23/2022   AAA (abdominal aortic aneurysm) without rupture ([place]) 09/19/2021   Shortness of breath 08/13/2021   Seasonal allergies 01/09/2021   Seborrheic dermatitis 01/09/2021   History of colon polyps 01/09/2021   Aortic atherosclerosis ([facility])    GERD (gastroesophageal reflux disease) 11/24/2020   Arthritis 11/24/2020   Leg cramps 11/24/2020   Bilateral carpal tunnel syndrome 11/24/2020   Chronic left shoulder pain 11/24/2020   Hyperlipidemia LDL goal <100 01/31/2016   REFERRING PROVIDER: Gwenlyn Perking, FNP   REFERRING DIAG: Chronic midline low back pain with left-sided sciatica   Rationale for Evaluation and Treatment: Rehabilitation  THERAPY DIAG:  Radiculopathy, lumbar region  Muscle weakness (generalized)  ONSET DATE: about 8 years ago   SUBJECTIVE:                                                                                                                                                                                            SUBJECTIVE STATEMENT: Patient reports that his back is a little sore because he has been doing quite a bit. He notes that he was able to walk up and down his hill at home which he had not been able to prior to therapy. He had to stop about 6 times due to fatigue, but he said "most people have to stop at least halfway anyway." He feels comfortable being discharged today.   PERTINENT HISTORY:  Abdominal aortic aneurysm, carpal tunnel, arthritis, and hypertension  PAIN:  Are you having pain? Yes: NPRS scale: none provided/10 Pain location: low back Pain description: aggravating   PRECAUTIONS: None  PATIENT GOALS: reduced pain, improved mobility, and resolve his numbness  NEXT MD VISIT: about 6 weeks  OBJECTIVE: all objective information was assessed at his initial evaluation on 09/18/22 unless otherwise noted  DIAGNOSTIC FINDINGS: 09/11/22 Lumbar x-ray IMPRESSION: Mild degenerative joint changes of lower lumbar spine.  LUMBAR ROM:   AROM eval 11/14/22  Flexion 32; limited by pain 50; pain at end range  Extension 14; limited by pain 20; pain at end range  Right lateral flexion 50% limited; "feels good" 50% limited; "I feel it"    Left lateral flexion 50% limited; limited by pain 50% limited; "I feel it more than the right"   Right rotation 75% limited; limited by pain 25% limited; nonpainful  Left rotation 50% limited; limited by pain 25% limited; "I feel it"    (Blank rows = not tested)  LOWER EXTREMITY ROM: WFL for activities assessed  LOWER EXTREMITY MMT:    MMT Right eval Left eval  Hip flexion 4-/5; discomfort 3/5; familiar pain  Hip extension    Hip abduction    Hip adduction    Hip internal rotation    Hip external rotation    Knee flexion 4/5; slight pain (L>R) 4-/5; slight pain (L>R)  Knee extension 4-/5 3+/5; discomfort  Ankle dorsiflexion 3+/5 3/5  Ankle  plantarflexion    Ankle inversion    Ankle eversion     (Blank rows = not tested)  TODAY'S TREATMENT:                                                                                                                              DATE:                                     3/19 EXERCISE LOG  Exercise Repetitions and Resistance Comments  Nustep  L5 x 20 minutes   Resisted pull down  Blue XTS x 20 reps   Resisted pull down with rotation Blue XTS x 20 reps each             Blank cell = exercise not performed today                                    3/5 EXERCISE LOG  Exercise Repetitions and Resistance Comments  Nustep  L5 x 20 minutes   Rocker board  5 minutes Intermittent UE support  BOSU lateral controls  3 minutes BUE support  Standing hip extension 30 reps each        Blank cell = exercise not performed today  2/22 EXERCISE LOG  Exercise Repetitions and Resistance Comments  Nustep  L4-5 x 17 minutes   Lateral step up  6" step x 15 reps each    Eccentric heel tap  4" step x 15 reps each    Self lumbar joint mobilization  With belt       Blank cell = exercise not performed today  Manual Therapy Soft Tissue Mobilization: bilateral lumbar paraspinals and QL, for reduced pain and tone Joint Mobilizations: L3-5 CPA's , grade I-III    PATIENT EDUCATION:  Education details: progress with therapy Person educated: Patient Education method: Explanation Education comprehension: verbalized understanding  HOME EXERCISE PROGRAM:  ASSESSMENT:  CLINICAL IMPRESSION:  Patient has met all of his goals for skilled physical therapy. He was able to demonstrate improved function with his daily activities as he is able to squat and pick up items from the floor without being limited by his familiar symptoms. He continues to experience a slight increase in discomfort, but he notes that this no longer limits his ability to complete any of his daily activities.  His HEP was reviewed and he reported feeling comfortable with these interventions. He was educated on the importance of continuing to exercise and remain active after therapy. He was also educated on how to safely progress his HEP and modify his activities, as needed. He reported understanding and felt comfortable being discharged at this time.   PHYSICAL THERAPY DISCHARGE SUMMARY  Visits from Start of Care: 16  Current functional level related to goals / functional outcomes: Patient was able to achieve all of his goals for physical therapy and reported feeling comfortable being discharged at this time.    Remaining deficits: None    Education / Equipment: HEP    Patient agrees to discharge. Patient goals were met. Patient is being discharged due to meeting the stated rehab goals.   OBJECTIVE IMPAIRMENTS: Abnormal gait, decreased activity tolerance, decreased mobility, difficulty walking, decreased ROM, decreased strength, hypomobility, impaired sensation, postural dysfunction, and pain.   ACTIVITY LIMITATIONS: carrying, lifting, bending, standing, squatting, sleeping, stairs, transfers, bed mobility, dressing, and locomotion level  PARTICIPATION LIMITATIONS: meal prep, cleaning, laundry, shopping, community activity, and yard work  PERSONAL FACTORS: Time since onset of injury/illness/exacerbation and 3+ comorbidities: Abdominal aortic aneurysm, carpal tunnel, arthritis, and hypertension  are also affecting patient's functional outcome.   REHAB POTENTIAL: Fair    CLINICAL DECISION MAKING: Unstable/unpredictable  EVALUATION COMPLEXITY: High  GOALS: Goals reviewed with patient? Yes  SHORT TERM GOALS: Target date: 10/09/22  Patient will be independent with his initial HEP. Baseline: Goal status: MET  2.  Patient will be able to stand and walk for at least 20 minutes without being limited by his familiar low back symptoms. Baseline:  Goal status: MET  3.  Patient will be able to  complete his daily activities without his familiar pain exceeding 8/10. Baseline:  Goal status: MET  LONG TERM GOALS: Target date: 10/30/22  Patient will be independent with his advanced HEP. Baseline:  Goal status: MET  2.  Patient will be able to walk for at least 30 minutes without being limited by his familiar low back symptoms. Baseline:11/14/22: 25-30 minutes at most; 12/24/22: not limited by pain  Goal status: MET  3.  Patient will be able to complete his daily activities without his familiar low back pain exceeding 6/10. Baseline: 11/14/22: still has some acute flare ups  Goal status: MET  4.  Patient will be able to transfer from supine  to sitting with minimal to no low back pain. Baseline: able to do that sometimes, but not as bad Goal status: MET  PLAN:  PT FREQUENCY: 1x/week  PT DURATION: 4 weeks  PLANNED INTERVENTIONS: Therapeutic exercises, Therapeutic activity, Neuromuscular re-education, Balance training, Gait training, Patient/Family education, Self Care, Joint mobilization, Stair training, Dry Needling, Electrical stimulation, Spinal mobilization, Cryotherapy, Moist heat, Traction, Manual therapy, and Re-evaluation.  PLAN FOR NEXT SESSION: NuStep, lower extremity strengthening, log rolling, lifting mechanics, manual therapy, modalities as needed  Darlin Coco, PT 12/24/2022, 11:49 AM

## 2022-12-25 ENCOUNTER — Other Ambulatory Visit: Payer: Self-pay | Admitting: Family Medicine

## 2022-12-25 DIAGNOSIS — E785 Hyperlipidemia, unspecified: Secondary | ICD-10-CM

## 2023-01-15 ENCOUNTER — Other Ambulatory Visit: Payer: Self-pay | Admitting: Family Medicine

## 2023-01-15 DIAGNOSIS — R0602 Shortness of breath: Secondary | ICD-10-CM

## 2023-01-15 DIAGNOSIS — Z91030 Bee allergy status: Secondary | ICD-10-CM

## 2023-01-30 ENCOUNTER — Other Ambulatory Visit: Payer: Self-pay | Admitting: Family Medicine

## 2023-01-30 DIAGNOSIS — J302 Other seasonal allergic rhinitis: Secondary | ICD-10-CM

## 2023-02-05 ENCOUNTER — Encounter: Payer: Self-pay | Admitting: Family Medicine

## 2023-02-05 ENCOUNTER — Ambulatory Visit (INDEPENDENT_AMBULATORY_CARE_PROVIDER_SITE_OTHER): Payer: Medicare HMO | Admitting: Family Medicine

## 2023-02-05 VITALS — BP 126/76 | HR 72 | Temp 97.8°F | Ht 69.0 in | Wt 213.1 lb

## 2023-02-05 DIAGNOSIS — I714 Abdominal aortic aneurysm, without rupture, unspecified: Secondary | ICD-10-CM | POA: Diagnosis not present

## 2023-02-05 DIAGNOSIS — S30861A Insect bite (nonvenomous) of abdominal wall, initial encounter: Secondary | ICD-10-CM | POA: Diagnosis not present

## 2023-02-05 DIAGNOSIS — K219 Gastro-esophageal reflux disease without esophagitis: Secondary | ICD-10-CM

## 2023-02-05 DIAGNOSIS — Z789 Other specified health status: Secondary | ICD-10-CM | POA: Insufficient documentation

## 2023-02-05 DIAGNOSIS — E785 Hyperlipidemia, unspecified: Secondary | ICD-10-CM | POA: Diagnosis not present

## 2023-02-05 DIAGNOSIS — M5442 Lumbago with sciatica, left side: Secondary | ICD-10-CM

## 2023-02-05 DIAGNOSIS — L089 Local infection of the skin and subcutaneous tissue, unspecified: Secondary | ICD-10-CM

## 2023-02-05 DIAGNOSIS — G8929 Other chronic pain: Secondary | ICD-10-CM | POA: Insufficient documentation

## 2023-02-05 DIAGNOSIS — J301 Allergic rhinitis due to pollen: Secondary | ICD-10-CM | POA: Diagnosis not present

## 2023-02-05 DIAGNOSIS — M25512 Pain in left shoulder: Secondary | ICD-10-CM

## 2023-02-05 DIAGNOSIS — I700 Atherosclerosis of aorta: Secondary | ICD-10-CM

## 2023-02-05 MED ORDER — PREDNISONE 10 MG (21) PO TBPK
ORAL_TABLET | ORAL | 0 refills | Status: DC
Start: 2023-02-05 — End: 2023-05-16

## 2023-02-05 MED ORDER — PANTOPRAZOLE SODIUM 40 MG PO TBEC: 40.0000 mg | DELAYED_RELEASE_TABLET | Freq: Two times a day (BID) | ORAL | 3 refills | Status: DC

## 2023-02-05 MED ORDER — DOXYCYCLINE HYCLATE 100 MG PO TABS
100.0000 mg | ORAL_TABLET | Freq: Two times a day (BID) | ORAL | 0 refills | Status: AC
Start: 2023-02-05 — End: 2023-02-12

## 2023-02-05 NOTE — Progress Notes (Signed)
Established Patient Office Visit  Subjective   [name], male    DOB: 01-17-1956  Age: 67 y.o. MRN: [id number]  Chief Complaint  Patient presents with   Medical Management of Chronic Issues    HPI GERD Compliant with medications - Yes Current medications - protonix 40 mg daily Sore throat - No Voice change - No Hemoptysis - No Dysphagia or dyspepsia - No Water brash - Yes, sometimes at night Red Flags (weight loss, hematochezia, melena, weight loss, early satiety, fevers, odynophagia, or persistent vomiting) - No  2. HLD Taking nexlizet 2-3x a week without side effects. Statin intolerant. Last LDL at goal. Regular diet. No exercise, but he is active.   3. Chronic back pain He has now completed PT now and this really reduced his symptoms. Reprots symptoms are well controlled and manageable now. He continues to have some occasional numbness and tingling in his left thigh, but this is much milder and less frequent. He continues to do the exercises at home as well. Denies changes in bowel or bladder control, saddle anesthesia, injury of fever.  Continue to take NSAIDs prn with good relief.   4. Shoulder Reports daily pain in his left shoulder for year. No known injury. Hx of arthritis on xray. It is achy and sometimes with sharp shooting pain with certain movement. Sometimes has some numbness in tingling. Pain is increased with overhead movement. He is interested in PT for this as he had so much success with is back.   5. SHOB He reports well controlled with spiriva. He rarely needs to use albuterol.   7. Insect bites He has chigger bites along her belt line of his abdomen. They have been very itching. He reports spreading erythema and yellow drainage for the last few days. He has been uses antibiotic ointment without improvement.   8. Allergy He is requesting a prednisone pack to have on hand. He keep on on hand due to the severity of past reactions to poison  oak/ivy. He has had reactions severe enough to cause significant shortness of breath. The one he has on hand is now expired. He does have an epi pen on hand as well.   Past Medical History:  Diagnosis Date   AAA (abdominal aortic aneurysm) without rupture ([facility]) 09/19/2021   Aortic atherosclerosis ([facility])    Arthritis 11/24/2020   Bilateral carpal tunnel syndrome 11/24/2020   Chronic left shoulder pain 11/24/2020   GERD (gastroesophageal reflux disease) 11/24/2020   Hyperlipidemia LDL goal <100 01/31/2016   Hypertension    Leg cramps 11/24/2020      ROS As per HPI.    Objective:     BP 126/76   Pulse 72   Temp 97.8 F (36.6 C) (Temporal)   Ht 5\' 9"  (1.753 m)   Wt 213 lb 2 oz (96.7 kg)   SpO2 94%   BMI 31.47 kg/m    Physical Exam Vitals and nursing note reviewed.  Constitutional:      General: He is not in acute distress.    Appearance: Normal appearance. He is not ill-appearing, toxic-appearing or diaphoretic.  Cardiovascular:     Rate and Rhythm: Normal rate and regular rhythm.     Heart sounds: Normal heart sounds. No murmur heard. Pulmonary:     Effort: Pulmonary effort is normal.     Breath sounds: Normal breath sounds.  Abdominal:     General: Bowel sounds are normal. There is no distension.  Palpations: Abdomen is soft.     Tenderness: There is no abdominal tenderness. There is no guarding or rebound.  Musculoskeletal:     Left shoulder: Tenderness and crepitus present. No swelling, deformity, effusion, laceration or bony tenderness. Decreased range of motion. Normal strength.  Skin:    General: Skin is warm and dry.     Comments: Insect bite to lower abdomen with surrounding erythema, warmth, tenderness, and yellow exudate. Shallow skin breakdown.   Neurological:     General: No focal deficit present.     Mental Status: He is alert and oriented to person, place, and time.  Psychiatric:        Mood and Affect: Mood normal.        Behavior: Behavior  normal.      No results found for any visits on 02/05/23.    The 10-year ASCVD risk score (Arnett DK, et al., 2019) is: 18%    Assessment & Plan:   Kinnie was seen today for medical management of chronic issues.  Diagnoses and all orders for this visit:  Gastroesophageal reflux disease, unspecified whether esophagitis present Not well controlled, no red flags. Increase protonix to BID. Follow up if symptoms worsen or do not improvement.  -     pantoprazole (PROTONIX) 40 MG tablet; Take 1 tablet (40 mg total) by mouth 2 (two) times daily before a meal.  Hyperlipidemia LDL goal <100 Statin intolerance Well controlled on current regimen. On nexlizet 2-3x a week.   Aortic atherosclerosis ([facility]CC) On aspirin.   Abdominal aortic aneurysm (AAA) without rupture, unspecified part ([facility]) Needs repeat US next year.   Chronic midline low back pain with left-sided sciatica Well controlled on current regimen. Continue gabapentin, flexeril prn, and home PT exercise. Discussed referral for injections if symptoms worsen.   Chronic left shoulder pain Referral to PT.  -     Ambulatory referral to Physical Therapy  Infected insect bite of abdomen, initial encounter Doxycyline as below. Discussed return precautions and home wound care.  -     doxycycline (VIBRA-TABS) 100 MG tablet; Take 1 tablet (100 mg total) by mouth 2 (two) times daily for 7 days.  Allergic reaction to plant, excluding food Hx of severe reactions to poison oak/ivy. He also has an epi pen on hand.  -     predniSONE (STERAPRED UNI-PAK 21 TAB) 10 MG (21) TBPK tablet; Use as directed on back of pill pack  Return in about 6 months (around 08/08/2023).   The patient indicates understanding of these issues and agrees with the plan.  Gabriel Earing, FNP

## 2023-02-11 ENCOUNTER — Other Ambulatory Visit: Payer: Self-pay | Admitting: Family Medicine

## 2023-02-11 DIAGNOSIS — B351 Tinea unguium: Secondary | ICD-10-CM

## 2023-02-12 ENCOUNTER — Ambulatory Visit: Payer: Medicare HMO | Attending: Family Medicine

## 2023-02-12 ENCOUNTER — Other Ambulatory Visit: Payer: Self-pay

## 2023-02-12 DIAGNOSIS — G8929 Other chronic pain: Secondary | ICD-10-CM

## 2023-02-12 DIAGNOSIS — M25512 Pain in left shoulder: Secondary | ICD-10-CM | POA: Diagnosis not present

## 2023-02-12 DIAGNOSIS — M25612 Stiffness of left shoulder, not elsewhere classified: Secondary | ICD-10-CM | POA: Insufficient documentation

## 2023-02-12 NOTE — Therapy (Signed)
OUTPATIENT PHYSICAL THERAPY SHOULDER EVALUATION   Patient Name: Domenik Rumpel MRN: [id number] DOB:[birth year], 67 y.o., male Today's Date: 02/12/2023  END OF SESSION:  PT End of Session - 02/12/23 0811     Visit Number 1    Number of Visits 12    Date for PT Re-Evaluation 05/02/23    PT Start Time 0816    PT Stop Time 0859    PT Time Calculation (min) 43 min    Activity Tolerance Patient tolerated treatment well    Behavior During Therapy [facility] for tasks assessed/performed             Past Medical History:  Diagnosis Date   AAA (abdominal aortic aneurysm) without rupture (HCC) 09/19/2021   Aortic atherosclerosis (HCC)    Arthritis 11/24/2020   Bilateral carpal tunnel syndrome 11/24/2020   Chronic left shoulder pain 11/24/2020   GERD (gastroesophageal reflux disease) 11/24/2020   Hyperlipidemia LDL goal <100 01/31/2016   Hypertension    Leg cramps 11/24/2020   Past Surgical History:  Procedure Laterality Date   LEG SURGERY Right 1975   Patient Active Problem List   Diagnosis Date Noted   Statin intolerance 02/05/2023   Chronic midline low back pain with left-sided sciatica 02/05/2023   Allergic reaction to plant, excluding food 02/05/2023   Bee sting allergy 01/23/2022   AAA (abdominal aortic aneurysm) without rupture (HCC) 09/19/2021   Shortness of breath 08/13/2021   Seasonal allergies 01/09/2021   Seborrheic dermatitis 01/09/2021   History of colon polyps 01/09/2021   Aortic atherosclerosis (HCC)    GERD (gastroesophageal reflux disease) 11/24/2020   Arthritis 11/24/2020   Leg cramps 11/24/2020   Bilateral carpal tunnel syndrome 11/24/2020   Chronic left shoulder pain 11/24/2020   Hyperlipidemia LDL goal <100 01/31/2016   REFERRING PROVIDER: Gabriel Earing, FNP   REFERRING DIAG: Chronic left shoulder pain   THERAPY DIAG:  Chronic left shoulder pain  Stiffness of left shoulder, not elsewhere classified  Rationale for Evaluation and Treatment:  Rehabilitation  ONSET DATE: 15 years ago  SUBJECTIVE:                                                                                                                                                                                      SUBJECTIVE STATEMENT: Patient reports that his left shoulder has been bothering him for 15 years due to arthritis. He has noticed that his pain is slowly getting worse. He has not tried any injections or physical therapy.  Hand dominance: Right  PERTINENT HISTORY: Abdominal aortic aneurysm, carpal tunnel, arthritis, carpal tunnel, and hypertension   PAIN:  Are you having pain? Yes: NPRS scale: 7-8/10  Pain location: left shoulder Pain description: toothache Aggravating factors: movement, sleeping on his left side (this is his preferred position) Relieving factors: medication   PRECAUTIONS: Fall  WEIGHT BEARING RESTRICTIONS: No  FALLS:  Has patient fallen in last 6 months? Yes. Number of falls 1  LIVING ENVIRONMENT: Lives with: lives with their spouse Lives in: House/apartment Stairs: Yes: External: 4 steps; can reach both Has following equipment at home: None  OCCUPATION: Retired  PLOF: Independent  PATIENT GOALS: reduced pain, improved mobility, and improved sleep (3 hours prior to being awakened by his shoulder pain)   NEXT MD VISIT: 08/11/23  OBJECTIVE:   PATIENT SURVEYS:  FOTO 58.55  COGNITION: Overall cognitive status: Within functional limits for tasks assessed     SENSATION: Patient reports no numbness or tingling  POSTURE: Forward head and rounded shoulders  UPPER EXTREMITY ROM:   Active ROM Right eval Left eval  Shoulder flexion 142 119; "painful if I go further"   Shoulder extension    Shoulder abduction 155 117; "painful if I go further"   Shoulder adduction    Shoulder internal rotation To T12; discomfort  To L2   Shoulder external rotation To T4 To spine of scapula; crepitus and some pain  Elbow flexion     Elbow extension    Wrist flexion    Wrist extension    Wrist ulnar deviation    Wrist radial deviation    Wrist pronation    Wrist supination    (Blank rows = not tested)  UPPER EXTREMITY MMT:  MMT Right eval Left eval  Shoulder flexion 4/5 4/5; painful  Shoulder extension    Shoulder abduction 4+/5 4+/5; painful   Shoulder adduction    Shoulder internal rotation 4+/5 4/5; discomfort  Shoulder external rotation 4+/5 4/5; discomfort  Middle trapezius    Lower trapezius 5/5 5/5  Elbow flexion 4+/5 4+/5  Elbow extension    Wrist flexion    Wrist extension    Wrist ulnar deviation    Wrist radial deviation    Wrist pronation    Wrist supination    Grip strength (lbs)    (Blank rows = not tested)  PALPATION:  No significant tenderness to palpation    TODAY'S TREATMENT:                                                                                                                                         DATE:    PATIENT EDUCATION: Education details: POC, healing, prognosis, anatomy, benefits of exercise, and goals for therapy Person educated: Patient Education method: Explanation Education comprehension: verbalized understanding  HOME EXERCISE PROGRAM:   ASSESSMENT:  CLINICAL IMPRESSION: Patient is a 67 y.o. male who was seen today for physical therapy evaluation and treatment for chronic left shoulder pain. He presented with moderate pain severity and irritability with left shoulder manual muscle testing and active range of motion  being the most aggravating to his familiar symptoms. He also exhibited reduced left shoulder active range of motion compared to the right shoulder. Recommend that he continue with skilled physical therapy to address his impairments to maximize his functional mobility.   OBJECTIVE IMPAIRMENTS: decreased activity tolerance, decreased mobility, decreased ROM, decreased strength, hypomobility, impaired UE functional use, postural dysfunction,  and pain.   ACTIVITY LIMITATIONS: lifting, sleeping, and reach over head  PARTICIPATION LIMITATIONS: community activity and yard work  PERSONAL FACTORS: Time since onset of injury/illness/exacerbation and 3+ comorbidities: Abdominal aortic aneurysm, carpal tunnel, arthritis, carpal tunnel, and hypertension   are also affecting patient's functional outcome.   REHAB POTENTIAL: Good  CLINICAL DECISION MAKING: Evolving/moderate complexity  EVALUATION COMPLEXITY: Moderate   GOALS: Goals reviewed with patient? Yes  SHORT TERM GOALS: Target date: 03/05/23  Patient will be independent with his initial HEP.  Baseline: Goal status: INITIAL  2.  Patient will be able to complete his daily activities without his familiar pain exceeding 5/10.  Baseline:  Goal status: INITIAL  3.  Patient will be able to demonstrate at least 120 degrees of left shoulder flexion for improved function reaching overhead.  Baseline:  Goal status: INITIAL  4.  Patient will be able to demonstrate at least 125 degrees of left shoulder abduction for improved function with overhead activity.  Baseline:  Goal status: INITIAL  LONG TERM GOALS: Target date: 03/26/23  Patient will be independent with his advanced HEP.  Baseline:  Goal status: INITIAL  2.  Patient will be able to complete his daily activities without his familiar pain exceeding 3/10.  Baseline:  Goal status: INITIAL  3.  Patient will be able to demonstrate at least 130 degrees of left shoulder flexion for improved function reaching overhead.  Baseline:  Goal status: INITIAL  4.  Patient will be able to demonstrate at least 135 degrees of left shoulder abduction for improved function with overhead activity.  Baseline:  Goal status: INITIAL  5.  Patient will report being able to sleep throughout the night without being awakened by his familiar left shoulder pain.  Baseline:  Goal status: INITIAL  PLAN:  PT FREQUENCY: 2x/week  PT  DURATION: 6 weeks  PLANNED INTERVENTIONS: Therapeutic exercises, Therapeutic activity, Neuromuscular re-education, Patient/Family education, Self Care, Joint mobilization, Electrical stimulation, Spinal mobilization, Cryotherapy, Moist heat, Vasopneumatic device, Manual therapy, and Re-evaluation  PLAN FOR NEXT SESSION: pulleys, rotator cuff strengthening, PROM, and modalities as needed   Granville Lewis, PT 02/12/2023, 10:28 AM

## 2023-02-17 ENCOUNTER — Ambulatory Visit: Payer: Medicare HMO

## 2023-02-17 DIAGNOSIS — M25612 Stiffness of left shoulder, not elsewhere classified: Secondary | ICD-10-CM

## 2023-02-17 DIAGNOSIS — M25512 Pain in left shoulder: Secondary | ICD-10-CM | POA: Diagnosis not present

## 2023-02-17 DIAGNOSIS — G8929 Other chronic pain: Secondary | ICD-10-CM | POA: Diagnosis not present

## 2023-02-17 NOTE — Therapy (Signed)
OUTPATIENT PHYSICAL THERAPY SHOULDER EVALUATION   Patient Name: James Davenport MRN: [id number] DOB:Feb 07, 1956, 67 y.o., male Today's Date: 02/17/2023  END OF SESSION:  PT End of Session - 02/17/23 0817     Visit Number 2    Number of Visits 12    Date for PT Re-Evaluation 05/02/23    PT Start Time 0817    PT Stop Time 0912    PT Time Calculation (min) 55 min    Activity Tolerance Patient tolerated treatment well    Behavior During Therapy [facility] for tasks assessed/performed             Past Medical History:  Diagnosis Date   AAA (abdominal aortic aneurysm) without rupture (HCC) 09/19/2021   Aortic atherosclerosis (HCC)    Arthritis 11/24/2020   Bilateral carpal tunnel syndrome 11/24/2020   Chronic left shoulder pain 11/24/2020   GERD (gastroesophageal reflux disease) 11/24/2020   Hyperlipidemia LDL goal <100 01/31/2016   Hypertension    Leg cramps 11/24/2020   Past Surgical History:  Procedure Laterality Date   LEG SURGERY Right 1975   Patient Active Problem List   Diagnosis Date Noted   Statin intolerance 02/05/2023   Chronic midline low back pain with left-sided sciatica 02/05/2023   Allergic reaction to plant, excluding food 02/05/2023   Bee sting allergy 01/23/2022   AAA (abdominal aortic aneurysm) without rupture (HCC) 09/19/2021   Shortness of breath 08/13/2021   Seasonal allergies 01/09/2021   Seborrheic dermatitis 01/09/2021   History of colon polyps 01/09/2021   Aortic atherosclerosis (HCC)    GERD (gastroesophageal reflux disease) 11/24/2020   Arthritis 11/24/2020   Leg cramps 11/24/2020   Bilateral carpal tunnel syndrome 11/24/2020   Chronic left shoulder pain 11/24/2020   Hyperlipidemia LDL goal <100 01/31/2016   REFERRING PROVIDER: Gabriel Earing, FNP   REFERRING DIAG: Chronic left shoulder pain   THERAPY DIAG:  Chronic left shoulder pain  Stiffness of left shoulder, not elsewhere classified  Rationale for Evaluation and Treatment:  Rehabilitation  ONSET DATE: 15 years ago  SUBJECTIVE:                                                                                                                                                                                      SUBJECTIVE STATEMENT: Pt denies any pain today, but reports that pain got up to 8-9/10 for about 24 hours after last treatment session.  Hand dominance: Right  PERTINENT HISTORY: Abdominal aortic aneurysm, carpal tunnel, arthritis, carpal tunnel, and hypertension   PAIN:  Are you having pain? Yes: NPRS scale: 0/10 Pain location: left shoulder Pain description: toothache Aggravating factors: movement, sleeping on his left  side (this is his preferred position) Relieving factors: medication   PRECAUTIONS: Fall  WEIGHT BEARING RESTRICTIONS: No  FALLS:  Has patient fallen in last 6 months? Yes. Number of falls 1  LIVING ENVIRONMENT: Lives with: lives with their spouse Lives in: House/apartment Stairs: Yes: External: 4 steps; can reach both Has following equipment at home: None  OCCUPATION: Retired  PLOF: Independent  PATIENT GOALS: reduced pain, improved mobility, and improved sleep (3 hours prior to being awakened by his shoulder pain)   NEXT MD VISIT: 08/11/23  OBJECTIVE:   PATIENT SURVEYS:  FOTO 58.55  COGNITION: Overall cognitive status: Within functional limits for tasks assessed     SENSATION: Patient reports no numbness or tingling  POSTURE: Forward head and rounded shoulders  UPPER EXTREMITY ROM:   Active ROM Right eval Left eval  Shoulder flexion 142 119; "painful if I go further"   Shoulder extension    Shoulder abduction 155 117; "painful if I go further"   Shoulder adduction    Shoulder internal rotation To T12; discomfort  To L2   Shoulder external rotation To T4 To spine of scapula; crepitus and some pain  Elbow flexion    Elbow extension    Wrist flexion    Wrist extension    Wrist ulnar deviation     Wrist radial deviation    Wrist pronation    Wrist supination    (Blank rows = not tested)  UPPER EXTREMITY MMT:  MMT Right eval Left eval  Shoulder flexion 4/5 4/5; painful  Shoulder extension    Shoulder abduction 4+/5 4+/5; painful   Shoulder adduction    Shoulder internal rotation 4+/5 4/5; discomfort  Shoulder external rotation 4+/5 4/5; discomfort  Middle trapezius    Lower trapezius 5/5 5/5  Elbow flexion 4+/5 4+/5  Elbow extension    Wrist flexion    Wrist extension    Wrist ulnar deviation    Wrist radial deviation    Wrist pronation    Wrist supination    Grip strength (lbs)    (Blank rows = not tested)  PALPATION:  No significant tenderness to palpation    TODAY'S TREATMENT:                                                                                                                                         DATE:                                     EXERCISE LOG  Exercise Repetitions and Resistance Comments  Pulleys 5 mins   Ranger Flexion/extension; CW and CCW circles x 2 mins   Wall ladder 5 reps (max 30)   Ball on Wall 2 mins        Blank cell = exercise not performed today   Manual Therapy  Passive ROM: left shoulder, PROM into flexion, abduction, and ER with gentle hold at end range to increase ROM    Modalities  Date:  Unattended Estim: Shoulder, IFC 80-150 Hz, 15 mins, Pain Hot Pack: Shoulder, 15 mins, Pain and Tone   PATIENT EDUCATION: Education details: POC, healing, prognosis, anatomy, benefits of exercise, and goals for therapy Person educated: Patient Education method: Explanation Education comprehension: verbalized understanding  HOME EXERCISE PROGRAM:   ASSESSMENT:  CLINICAL IMPRESSION: Pt arrives for today's treatment session denying any pain.  Pt reports marked increased in pain after evaluation for approximately 24 hours.  Pt instructed in AAROM with pulleys and ranger during today's treatment session.  PROM performed into  flexion, abduction, and ER with pt requiring cues for pt to relax.  Normal responses to estim and vaso noted upon removal.  Pt denied any pain at completion of today's treatment session.  OBJECTIVE IMPAIRMENTS: decreased activity tolerance, decreased mobility, decreased ROM, decreased strength, hypomobility, impaired UE functional use, postural dysfunction, and pain.   ACTIVITY LIMITATIONS: lifting, sleeping, and reach over head  PARTICIPATION LIMITATIONS: community activity and yard work  PERSONAL FACTORS: Time since onset of injury/illness/exacerbation and 3+ comorbidities: Abdominal aortic aneurysm, carpal tunnel, arthritis, carpal tunnel, and hypertension   are also affecting patient's functional outcome.   REHAB POTENTIAL: Good  CLINICAL DECISION MAKING: Evolving/moderate complexity  EVALUATION COMPLEXITY: Moderate   GOALS: Goals reviewed with patient? Yes  SHORT TERM GOALS: Target date: 03/05/23  Patient will be independent with his initial HEP.  Baseline: Goal status: INITIAL  2.  Patient will be able to complete his daily activities without his familiar pain exceeding 5/10.  Baseline:  Goal status: INITIAL  3.  Patient will be able to demonstrate at least 120 degrees of left shoulder flexion for improved function reaching overhead.  Baseline:  Goal status: INITIAL  4.  Patient will be able to demonstrate at least 125 degrees of left shoulder abduction for improved function with overhead activity.  Baseline:  Goal status: INITIAL  LONG TERM GOALS: Target date: 03/26/23  Patient will be independent with his advanced HEP.  Baseline:  Goal status: INITIAL  2.  Patient will be able to complete his daily activities without his familiar pain exceeding 3/10.  Baseline:  Goal status: INITIAL  3.  Patient will be able to demonstrate at least 130 degrees of left shoulder flexion for improved function reaching overhead.  Baseline:  Goal status: INITIAL  4.  Patient will  be able to demonstrate at least 135 degrees of left shoulder abduction for improved function with overhead activity.  Baseline:  Goal status: INITIAL  5.  Patient will report being able to sleep throughout the night without being awakened by his familiar left shoulder pain.  Baseline:  Goal status: INITIAL  PLAN:  PT FREQUENCY: 2x/week  PT DURATION: 6 weeks  PLANNED INTERVENTIONS: Therapeutic exercises, Therapeutic activity, Neuromuscular re-education, Patient/Family education, Self Care, Joint mobilization, Electrical stimulation, Spinal mobilization, Cryotherapy, Moist heat, Vasopneumatic device, Manual therapy, and Re-evaluation  PLAN FOR NEXT SESSION: pulleys, rotator cuff strengthening, PROM, and modalities as needed   Newman Pies, PTA 02/17/2023, 9:14 AM

## 2023-02-19 ENCOUNTER — Ambulatory Visit: Payer: Medicare HMO | Admitting: Physical Therapy

## 2023-02-19 ENCOUNTER — Encounter: Payer: Self-pay | Admitting: Family Medicine

## 2023-02-19 ENCOUNTER — Encounter: Payer: Self-pay | Admitting: Physical Therapy

## 2023-02-19 DIAGNOSIS — G8929 Other chronic pain: Secondary | ICD-10-CM

## 2023-02-19 DIAGNOSIS — M25512 Pain in left shoulder: Secondary | ICD-10-CM | POA: Diagnosis not present

## 2023-02-19 DIAGNOSIS — M25612 Stiffness of left shoulder, not elsewhere classified: Secondary | ICD-10-CM

## 2023-02-19 NOTE — Therapy (Addendum)
OUTPATIENT PHYSICAL THERAPY SHOULDER TREATMENT   Patient Name: James Davenport MRN: [id number] DOB:August 31, 1956, 67 y.o., male Today's Date: 02/19/2023  END OF SESSION:  PT End of Session - 02/19/23 0811     Visit Number 3    Number of Visits 12    Date for PT Re-Evaluation 05/02/23    PT Start Time 0816    PT Stop Time 0903    PT Time Calculation (min) 47 min    Activity Tolerance Patient tolerated treatment well    Behavior During Therapy [facility] for tasks assessed/performed            Past Medical History:  Diagnosis Date   AAA (abdominal aortic aneurysm) without rupture (HCC) 09/19/2021   Aortic atherosclerosis (HCC)    Arthritis 11/24/2020   Bilateral carpal tunnel syndrome 11/24/2020   Chronic left shoulder pain 11/24/2020   GERD (gastroesophageal reflux disease) 11/24/2020   Hyperlipidemia LDL goal <100 01/31/2016   Hypertension    Leg cramps 11/24/2020   Past Surgical History:  Procedure Laterality Date   LEG SURGERY Right 1975   Patient Active Problem List   Diagnosis Date Noted   Statin intolerance 02/05/2023   Chronic midline low back pain with left-sided sciatica 02/05/2023   Allergic reaction to plant, excluding food 02/05/2023   Bee sting allergy 01/23/2022   AAA (abdominal aortic aneurysm) without rupture (HCC) 09/19/2021   Shortness of breath 08/13/2021   Seasonal allergies 01/09/2021   Seborrheic dermatitis 01/09/2021   History of colon polyps 01/09/2021   Aortic atherosclerosis (HCC)    GERD (gastroesophageal reflux disease) 11/24/2020   Arthritis 11/24/2020   Leg cramps 11/24/2020   Bilateral carpal tunnel syndrome 11/24/2020   Chronic left shoulder pain 11/24/2020   Hyperlipidemia LDL goal <100 01/31/2016   REFERRING PROVIDER: Gabriel Earing, FNP   REFERRING DIAG: Chronic left shoulder pain   THERAPY DIAG:  Chronic left shoulder pain  Stiffness of left shoulder, not elsewhere classified  Rationale for Evaluation and Treatment:  Rehabilitation  ONSET DATE: 15 years ago  SUBJECTIVE:                                                                                                                                                                                      SUBJECTIVE STATEMENT: Patient reported a lot of pain after eval but takes meds as directed.  Hand dominance: Right  PERTINENT HISTORY: Abdominal aortic aneurysm, carpal tunnel, arthritis, carpal tunnel, and hypertension   PAIN:  Are you having pain? Yes: NPRS scale: 0/10 Pain location: left shoulder Pain description: toothache Aggravating factors: movement, sleeping on his left side (this is his preferred position) Relieving factors: medication  PRECAUTIONS: Fall  WEIGHT BEARING RESTRICTIONS: No  FALLS:  Has patient fallen in last 6 months? Yes. Number of falls 1  PLOF: Independent  PATIENT GOALS: reduced pain, improved mobility, and improved sleep (3 hours prior to being awakened by his shoulder pain)   NEXT MD VISIT: 08/11/23  OBJECTIVE:   PATIENT SURVEYS:  FOTO 58.55  POSTURE: Forward head and rounded shoulders  UPPER EXTREMITY ROM:   Active ROM Right eval Left eval  Shoulder flexion 142 119; "painful if I go further"   Shoulder extension    Shoulder abduction 155 117; "painful if I go further"   Shoulder adduction    Shoulder internal rotation To T12; discomfort  To L2   Shoulder external rotation To T4 To spine of scapula; crepitus and some pain  Elbow flexion    Elbow extension    Wrist flexion    Wrist extension    Wrist ulnar deviation    Wrist radial deviation    Wrist pronation    Wrist supination    (Blank rows = not tested)  UPPER EXTREMITY MMT:  MMT Right eval Left eval  Shoulder flexion 4/5 4/5; painful  Shoulder extension    Shoulder abduction 4+/5 4+/5; painful   Shoulder adduction    Shoulder internal rotation 4+/5 4/5; discomfort  Shoulder external rotation 4+/5 4/5; discomfort  Middle trapezius     Lower trapezius 5/5 5/5  Elbow flexion 4+/5 4+/5  Elbow extension    Wrist flexion    Wrist extension    Wrist ulnar deviation    Wrist radial deviation    Wrist pronation    Wrist supination    Grip strength (lbs)    (Blank rows = not tested)  PALPATION:  No significant tenderness to palpation    TODAY'S TREATMENT:                                                                                                                                         DATE:      02/19/23   EXERCISE LOG  Exercise Repetitions and Resistance Comments  Pulleys 5 mins   Ranger Flexion/extension; CW and CCW circles x 3 mins   Wall ladder 10 reps (max 28)   Isometrics ER/IR/flex/abd 5 x5 sec holds   UBE 90 RPM x8 min (forward/backward)    Blank cell = exercise not performed today   Modalities  Date: 02/19/23 Unattended Estim: Shoulder, IFC 80-150 Hz, 10 mins, Pain Hot Pack: Shoulder, 10 mins, Pain and Tone   PATIENT EDUCATION: Education details: POC, healing, prognosis, anatomy, benefits of exercise, and goals for therapy Person educated: Patient Education method: Explanation Education comprehension: verbalized understanding  HOME EXERCISE PROGRAM:  ASSESSMENT:  CLINICAL IMPRESSION: Patient presented in clinic with reports of intermittent L shoulder popping sensation with certain movements. Patient able to tolerate therex fair with popping indicated at [facility] region. Isometrics introduced  today for light reps and holds with greater discomfort with ER. Normal modalities response noted following removal of the modalities.  OBJECTIVE IMPAIRMENTS: decreased activity tolerance, decreased mobility, decreased ROM, decreased strength, hypomobility, impaired UE functional use, postural dysfunction, and pain.   ACTIVITY LIMITATIONS: lifting, sleeping, and reach over head  PARTICIPATION LIMITATIONS: community activity and yard work  PERSONAL FACTORS: Time since onset of injury/illness/exacerbation and  3+ comorbidities: Abdominal aortic aneurysm, carpal tunnel, arthritis, carpal tunnel, and hypertension   are also affecting patient's functional outcome.   REHAB POTENTIAL: Good  CLINICAL DECISION MAKING: Evolving/moderate complexity  EVALUATION COMPLEXITY: Moderate   GOALS: Goals reviewed with patient? Yes  SHORT TERM GOALS: Target date: 03/05/23  Patient will be independent with his initial HEP.  Baseline: Goal status: INITIAL  2.  Patient will be able to complete his daily activities without his familiar pain exceeding 5/10.  Baseline:  Goal status: INITIAL  3.  Patient will be able to demonstrate at least 120 degrees of left shoulder flexion for improved function reaching overhead.  Baseline:  Goal status: INITIAL  4.  Patient will be able to demonstrate at least 125 degrees of left shoulder abduction for improved function with overhead activity.  Baseline:  Goal status: INITIAL  LONG TERM GOALS: Target date: 03/26/23  Patient will be independent with his advanced HEP.  Baseline:  Goal status: INITIAL  2.  Patient will be able to complete his daily activities without his familiar pain exceeding 3/10.  Baseline:  Goal status: INITIAL  3.  Patient will be able to demonstrate at least 130 degrees of left shoulder flexion for improved function reaching overhead.  Baseline:  Goal status: INITIAL  4.  Patient will be able to demonstrate at least 135 degrees of left shoulder abduction for improved function with overhead activity.  Baseline:  Goal status: INITIAL  5.  Patient will report being able to sleep throughout the night without being awakened by his familiar left shoulder pain.  Baseline:  Goal status: INITIAL  PLAN:  PT FREQUENCY: 2x/week  PT DURATION: 6 weeks  PLANNED INTERVENTIONS: Therapeutic exercises, Therapeutic activity, Neuromuscular re-education, Patient/Family education, Self Care, Joint mobilization, Electrical stimulation, Spinal mobilization,  Cryotherapy, Moist heat, Vasopneumatic device, Manual therapy, and Re-evaluation  PLAN FOR NEXT SESSION: pulleys, rotator cuff strengthening, PROM, and modalities as needed   Marvell Fuller, PTA 02/19/2023, 9:44 AM

## 2023-02-24 ENCOUNTER — Ambulatory Visit: Payer: Medicare HMO | Admitting: Physical Therapy

## 2023-02-24 ENCOUNTER — Encounter: Payer: Self-pay | Admitting: Physical Therapy

## 2023-02-24 DIAGNOSIS — G8929 Other chronic pain: Secondary | ICD-10-CM | POA: Diagnosis not present

## 2023-02-24 DIAGNOSIS — M25612 Stiffness of left shoulder, not elsewhere classified: Secondary | ICD-10-CM | POA: Diagnosis not present

## 2023-02-24 DIAGNOSIS — M25512 Pain in left shoulder: Secondary | ICD-10-CM | POA: Diagnosis not present

## 2023-02-24 NOTE — Therapy (Signed)
OUTPATIENT PHYSICAL THERAPY SHOULDER TREATMENT   Patient Name: James Davenport MRN: [id number] DOB:1956/06/23, 67 y.o., male Today's Date: 02/24/2023  END OF SESSION:  PT End of Session - 02/24/23 0817     Visit Number 4    Number of Visits 12    Date for PT Re-Evaluation 05/02/23    PT Start Time 0816    PT Stop Time 0904    PT Time Calculation (min) 48 min    Activity Tolerance Patient tolerated treatment well    Behavior During Therapy [facility] for tasks assessed/performed            Past Medical History:  Diagnosis Date   AAA (abdominal aortic aneurysm) without rupture (HCC) 09/19/2021   Aortic atherosclerosis (HCC)    Arthritis 11/24/2020   Bilateral carpal tunnel syndrome 11/24/2020   Chronic left shoulder pain 11/24/2020   GERD (gastroesophageal reflux disease) 11/24/2020   Hyperlipidemia LDL goal <100 01/31/2016   Hypertension    Leg cramps 11/24/2020   Past Surgical History:  Procedure Laterality Date   LEG SURGERY Right 1975   Patient Active Problem List   Diagnosis Date Noted   Statin intolerance 02/05/2023   Chronic midline low back pain with left-sided sciatica 02/05/2023   Allergic reaction to plant, excluding food 02/05/2023   Bee sting allergy 01/23/2022   AAA (abdominal aortic aneurysm) without rupture (HCC) 09/19/2021   Shortness of breath 08/13/2021   Seasonal allergies 01/09/2021   Seborrheic dermatitis 01/09/2021   History of colon polyps 01/09/2021   Aortic atherosclerosis (HCC)    GERD (gastroesophageal reflux disease) 11/24/2020   Arthritis 11/24/2020   Leg cramps 11/24/2020   Bilateral carpal tunnel syndrome 11/24/2020   Chronic left shoulder pain 11/24/2020   Hyperlipidemia LDL goal <100 01/31/2016   REFERRING PROVIDER: Gabriel Earing, FNP   REFERRING DIAG: Chronic left shoulder pain   THERAPY DIAG:  Chronic left shoulder pain  Stiffness of left shoulder, not elsewhere classified  Rationale for Evaluation and Treatment:  Rehabilitation  ONSET DATE: 15 years ago  SUBJECTIVE:                                                                                                                                                                                      SUBJECTIVE STATEMENT: Reports that his shoulder isn't bad today.  Hand dominance: Right  PERTINENT HISTORY: Abdominal aortic aneurysm, carpal tunnel, arthritis, carpal tunnel, and hypertension   PAIN:  Are you having pain? Yes: NPRS scale: 2/10 Pain location: left shoulder Pain description: toothache Aggravating factors: movement, sleeping on his left side (this is his preferred position) Relieving factors: medication   PRECAUTIONS: Fall  WEIGHT  BEARING RESTRICTIONS: No  FALLS:  Has patient fallen in last 6 months? Yes. Number of falls 1  PLOF: Independent  PATIENT GOALS: reduced pain, improved mobility, and improved sleep (3 hours prior to being awakened by his shoulder pain)   NEXT MD VISIT: 08/11/23  OBJECTIVE:   PATIENT SURVEYS:  FOTO 58.55  POSTURE: Forward head and rounded shoulders  UPPER EXTREMITY ROM:   Active ROM Right eval Left eval  Shoulder flexion 142 119; "painful if I go further"   Shoulder extension    Shoulder abduction 155 117; "painful if I go further"   Shoulder adduction    Shoulder internal rotation To T12; discomfort  To L2   Shoulder external rotation To T4 To spine of scapula; crepitus and some pain  Elbow flexion    Elbow extension    Wrist flexion    Wrist extension    Wrist ulnar deviation    Wrist radial deviation    Wrist pronation    Wrist supination    (Blank rows = not tested)  UPPER EXTREMITY MMT:  MMT Right eval Left eval  Shoulder flexion 4/5 4/5; painful  Shoulder extension    Shoulder abduction 4+/5 4+/5; painful   Shoulder adduction    Shoulder internal rotation 4+/5 4/5; discomfort  Shoulder external rotation 4+/5 4/5; discomfort  Middle trapezius    Lower trapezius 5/5  5/5  Elbow flexion 4+/5 4+/5  Elbow extension    Wrist flexion    Wrist extension    Wrist ulnar deviation    Wrist radial deviation    Wrist pronation    Wrist supination    Grip strength (lbs)    (Blank rows = not tested)  PALPATION:  No significant tenderness to palpation    TODAY'S TREATMENT:                                                                                                                                         DATE:      02/24/23   EXERCISE LOG  Exercise Repetitions and Resistance Comments  Pulleys 3 mins More elbow related pain.  Wall slides with ER X15 reps   Wall ladder 10 reps (max 29)   Isometrics ER/IR/flex 5 x5 sec holds Greater pain in hand with flex and more pain in ER  UBE 90 RPM x8 min (forward/backward)    Blank cell = exercise not performed today   Modalities  Date: 02/24/23 Unattended Estim: Shoulder, IFC 80-150 Hz, 15 mins, Pain Hot Pack: Shoulder, 15 mins, Pain and Tone   PATIENT EDUCATION: Education details: POC, healing, prognosis, anatomy, benefits of exercise, and goals for therapy Person educated: Patient Education method: Explanation Education comprehension: verbalized understanding  HOME EXERCISE PROGRAM:  ASSESSMENT:  CLINICAL IMPRESSION: Patient presented in clinic with reports of mild L shoulder pain. Patient did not take meds at his normal time and reported more L  elbow and carpal tunnel pain today. Patient limited with isometrics due to pain especially ER. Intermittent pain also reported with shoulder flexion as well. Normal modalities response noted following removal of the modalities.  OBJECTIVE IMPAIRMENTS: decreased activity tolerance, decreased mobility, decreased ROM, decreased strength, hypomobility, impaired UE functional use, postural dysfunction, and pain.   ACTIVITY LIMITATIONS: lifting, sleeping, and reach over head  PARTICIPATION LIMITATIONS: community activity and yard work  PERSONAL FACTORS: Time since  onset of injury/illness/exacerbation and 3+ comorbidities: Abdominal aortic aneurysm, carpal tunnel, arthritis, carpal tunnel, and hypertension   are also affecting patient's functional outcome.   REHAB POTENTIAL: Good  CLINICAL DECISION MAKING: Evolving/moderate complexity  EVALUATION COMPLEXITY: Moderate   GOALS: Goals reviewed with patient? Yes  SHORT TERM GOALS: Target date: 03/05/23  Patient will be independent with his initial HEP.  Baseline: Goal status: INITIAL  2.  Patient will be able to complete his daily activities without his familiar pain exceeding 5/10.  Baseline:  Goal status: INITIAL  3.  Patient will be able to demonstrate at least 120 degrees of left shoulder flexion for improved function reaching overhead.  Baseline:  Goal status: INITIAL  4.  Patient will be able to demonstrate at least 125 degrees of left shoulder abduction for improved function with overhead activity.  Baseline:  Goal status: INITIAL  LONG TERM GOALS: Target date: 03/26/23  Patient will be independent with his advanced HEP.  Baseline:  Goal status: INITIAL  2.  Patient will be able to complete his daily activities without his familiar pain exceeding 3/10.  Baseline:  Goal status: INITIAL  3.  Patient will be able to demonstrate at least 130 degrees of left shoulder flexion for improved function reaching overhead.  Baseline:  Goal status: INITIAL  4.  Patient will be able to demonstrate at least 135 degrees of left shoulder abduction for improved function with overhead activity.  Baseline:  Goal status: INITIAL  5.  Patient will report being able to sleep throughout the night without being awakened by his familiar left shoulder pain.  Baseline:  Goal status: INITIAL  PLAN:  PT FREQUENCY: 2x/week  PT DURATION: 6 weeks  PLANNED INTERVENTIONS: Therapeutic exercises, Therapeutic activity, Neuromuscular re-education, Patient/Family education, Self Care, Joint mobilization,  Electrical stimulation, Spinal mobilization, Cryotherapy, Moist heat, Vasopneumatic device, Manual therapy, and Re-evaluation  PLAN FOR NEXT SESSION: pulleys, rotator cuff strengthening, PROM, and modalities as needed   Marvell Fuller, PTA 02/24/2023, 9:35 AM

## 2023-02-27 ENCOUNTER — Ambulatory Visit: Payer: Medicare HMO

## 2023-02-27 DIAGNOSIS — G8929 Other chronic pain: Secondary | ICD-10-CM

## 2023-02-27 DIAGNOSIS — M25512 Pain in left shoulder: Secondary | ICD-10-CM | POA: Diagnosis not present

## 2023-02-27 DIAGNOSIS — M25612 Stiffness of left shoulder, not elsewhere classified: Secondary | ICD-10-CM

## 2023-02-27 NOTE — Therapy (Signed)
OUTPATIENT PHYSICAL THERAPY SHOULDER TREATMENT   Patient Name: James Davenport MRN: [id number] DOB:02-Aug-1956, 67 y.o., male Today's Date: 02/27/2023  END OF SESSION:  PT End of Session - 02/27/23 0820     Visit Number 5    Number of Visits 12    Date for PT Re-Evaluation 05/02/23    PT Start Time 0815    PT Stop Time 0912    PT Time Calculation (min) 57 min    Activity Tolerance Patient tolerated treatment well    Behavior During Therapy [facility] for tasks assessed/performed            Past Medical History:  Diagnosis Date   AAA (abdominal aortic aneurysm) without rupture (HCC) 09/19/2021   Aortic atherosclerosis (HCC)    Arthritis 11/24/2020   Bilateral carpal tunnel syndrome 11/24/2020   Chronic left shoulder pain 11/24/2020   GERD (gastroesophageal reflux disease) 11/24/2020   Hyperlipidemia LDL goal <100 01/31/2016   Hypertension    Leg cramps 11/24/2020   Past Surgical History:  Procedure Laterality Date   LEG SURGERY Right 1975   Patient Active Problem List   Diagnosis Date Noted   Statin intolerance 02/05/2023   Chronic midline low back pain with left-sided sciatica 02/05/2023   Allergic reaction to plant, excluding food 02/05/2023   Bee sting allergy 01/23/2022   AAA (abdominal aortic aneurysm) without rupture (HCC) 09/19/2021   Shortness of breath 08/13/2021   Seasonal allergies 01/09/2021   Seborrheic dermatitis 01/09/2021   History of colon polyps 01/09/2021   Aortic atherosclerosis (HCC)    GERD (gastroesophageal reflux disease) 11/24/2020   Arthritis 11/24/2020   Leg cramps 11/24/2020   Bilateral carpal tunnel syndrome 11/24/2020   Chronic left shoulder pain 11/24/2020   Hyperlipidemia LDL goal <100 01/31/2016   REFERRING PROVIDER: Gabriel Earing, FNP   REFERRING DIAG: Chronic left shoulder pain   THERAPY DIAG:  Chronic left shoulder pain  Stiffness of left shoulder, not elsewhere classified  Rationale for Evaluation and Treatment:  Rehabilitation  ONSET DATE: 15 years ago  SUBJECTIVE:                                                                                                                                                                                      SUBJECTIVE STATEMENT: Pt reports increased shoulder pain and tightness this morning.   Hand dominance: Right  PERTINENT HISTORY: Abdominal aortic aneurysm, carpal tunnel, arthritis, carpal tunnel, and hypertension   PAIN:  Are you having pain? Yes: NPRS scale: 4-5/10 Pain location: left shoulder Pain description: toothache Aggravating factors: movement, sleeping on his left side (this is his preferred position) Relieving factors: medication   PRECAUTIONS:  Fall  WEIGHT BEARING RESTRICTIONS: No  FALLS:  Has patient fallen in last 6 months? Yes. Number of falls 1  PLOF: Independent  PATIENT GOALS: reduced pain, improved mobility, and improved sleep (3 hours prior to being awakened by his shoulder pain)   NEXT MD VISIT: 08/11/23  OBJECTIVE:   PATIENT SURVEYS:  FOTO 58.55  POSTURE: Forward head and rounded shoulders  UPPER EXTREMITY ROM:   Active ROM Right eval Left eval  Shoulder flexion 142 119; "painful if I go further"   Shoulder extension    Shoulder abduction 155 117; "painful if I go further"   Shoulder adduction    Shoulder internal rotation To T12; discomfort  To L2   Shoulder external rotation To T4 To spine of scapula; crepitus and some pain  Elbow flexion    Elbow extension    Wrist flexion    Wrist extension    Wrist ulnar deviation    Wrist radial deviation    Wrist pronation    Wrist supination    (Blank rows = not tested)  UPPER EXTREMITY MMT:  MMT Right eval Left eval  Shoulder flexion 4/5 4/5; painful  Shoulder extension    Shoulder abduction 4+/5 4+/5; painful   Shoulder adduction    Shoulder internal rotation 4+/5 4/5; discomfort  Shoulder external rotation 4+/5 4/5; discomfort  Middle trapezius     Lower trapezius 5/5 5/5  Elbow flexion 4+/5 4+/5  Elbow extension    Wrist flexion    Wrist extension    Wrist ulnar deviation    Wrist radial deviation    Wrist pronation    Wrist supination    Grip strength (lbs)    (Blank rows = not tested)  PALPATION:  No significant tenderness to palpation    TODAY'S TREATMENT:                                                                                                                                         DATE:      02/27/23   EXERCISE LOG  Exercise Repetitions and Resistance Comments  Pulleys 3 mins More elbow related pain.  Wall slides with ER X15 reps   Wall ladder 10 reps (max 29)   Isometrics ER/IR/flex 5 x5 sec holds Greater pain in hand with flex and more pain in ER  UBE 90 RPM x10 min (forward/backward)    Blank cell = exercise not performed today   Manual Therapy Soft Tissue Mobilization: right upper trap, STW/M to right upper trap and cervical paraspinals to decrease pain and tone with good results    Modalities  Date: 02/27/23 Unattended Estim: Shoulder, IFC 80-150 Hz, 15 mins, Pain Hot Pack: Shoulder, 15 mins, Pain and Tone   PATIENT EDUCATION: Education details: POC, healing, prognosis, anatomy, benefits of exercise, and goals for therapy Person educated: Patient Education method: Explanation Education comprehension: verbalized understanding  HOME EXERCISE  PROGRAM:  ASSESSMENT:  CLINICAL IMPRESSION: Pt arrives for today's treatment session 4-5/10 left shoulder pain.  Pt able to increase FOTO score to 61 today.  Pt is making good progress towards his goals at this time.  Pt able to demonstrate 130 degrees of active left shoulder flexion and 122 degrees of active left shoulder abduction.  STW/M performed to left upper trap and cervical paraspinals to decrease pain and tone with good results.  Normal responses to estim and MH noted upon removal.  Pt reported 3/10 left shoulder pain at completion of today's treatment  session.  OBJECTIVE IMPAIRMENTS: decreased activity tolerance, decreased mobility, decreased ROM, decreased strength, hypomobility, impaired UE functional use, postural dysfunction, and pain.   ACTIVITY LIMITATIONS: lifting, sleeping, and reach over head  PARTICIPATION LIMITATIONS: community activity and yard work  PERSONAL FACTORS: Time since onset of injury/illness/exacerbation and 3+ comorbidities: Abdominal aortic aneurysm, carpal tunnel, arthritis, carpal tunnel, and hypertension   are also affecting patient's functional outcome.   REHAB POTENTIAL: Good  CLINICAL DECISION MAKING: Evolving/moderate complexity  EVALUATION COMPLEXITY: Moderate   GOALS: Goals reviewed with patient? Yes  SHORT TERM GOALS: Target date: 03/05/23  Patient will be independent with his initial HEP.  Baseline: Goal status: MET  2.  Patient will be able to complete his daily activities without his familiar pain exceeding 5/10.  Baseline: 5/23: depends on the day or activity Goal status: IN PROGRESS  3.  Patient will be able to demonstrate at least 120 degrees of left shoulder flexion for improved function reaching overhead.  Baseline: 5/23: 130 degrees Goal status: MET  4.  Patient will be able to demonstrate at least 125 degrees of left shoulder abduction for improved function with overhead activity.  Baseline: 5/23: 122 degrees Goal status: IN PROGRESS  LONG TERM GOALS: Target date: 03/26/23  Patient will be independent with his advanced HEP.  Baseline:  Goal status: IN PROGRESS  2.  Patient will be able to complete his daily activities without his familiar pain exceeding 3/10.  Baseline:  Goal status: IN PROGRESS  3.  Patient will be able to demonstrate at least 130 degrees of left shoulder flexion for improved function reaching overhead.  Baseline: 5/23: 130 degrees Goal status: MET  4.  Patient will be able to demonstrate at least 135 degrees of left shoulder abduction for improved  function with overhead activity.  Baseline:  Goal status: IN PROGRESS  5.  Patient will report being able to sleep throughout the night without being awakened by his familiar left shoulder pain.  Baseline:  Goal status: IN PROGRESS  PLAN:  PT FREQUENCY: 2x/week  PT DURATION: 6 weeks  PLANNED INTERVENTIONS: Therapeutic exercises, Therapeutic activity, Neuromuscular re-education, Patient/Family education, Self Care, Joint mobilization, Electrical stimulation, Spinal mobilization, Cryotherapy, Moist heat, Vasopneumatic device, Manual therapy, and Re-evaluation  PLAN FOR NEXT SESSION: pulleys, rotator cuff strengthening, PROM, and modalities as needed   Newman Pies, PTA 02/27/2023, 9:30 AM

## 2023-03-02 ENCOUNTER — Other Ambulatory Visit: Payer: Self-pay | Admitting: Family Medicine

## 2023-03-02 DIAGNOSIS — E785 Hyperlipidemia, unspecified: Secondary | ICD-10-CM

## 2023-03-11 ENCOUNTER — Ambulatory Visit: Payer: Medicare HMO | Attending: Family Medicine

## 2023-03-11 DIAGNOSIS — M25512 Pain in left shoulder: Secondary | ICD-10-CM | POA: Diagnosis not present

## 2023-03-11 DIAGNOSIS — M25612 Stiffness of left shoulder, not elsewhere classified: Secondary | ICD-10-CM | POA: Insufficient documentation

## 2023-03-11 DIAGNOSIS — G8929 Other chronic pain: Secondary | ICD-10-CM | POA: Diagnosis not present

## 2023-03-11 NOTE — Therapy (Signed)
OUTPATIENT PHYSICAL THERAPY SHOULDER TREATMENT   Patient Name: James Davenport MRN: [id number] DOB:[birth year], 67 y.o., male Today's Date: 03/11/2023  END OF SESSION:  PT End of Session - 03/11/23 0807     Visit Number 6    Number of Visits 12    Date for PT Re-Evaluation 05/02/23    PT Start Time 0805    PT Stop Time 0842    PT Time Calculation (min) 37 min    Activity Tolerance Patient tolerated treatment well    Behavior During Therapy [facility] for tasks assessed/performed            Past Medical History:  Diagnosis Date   AAA (abdominal aortic aneurysm) without rupture (HCC) 09/19/2021   Aortic atherosclerosis (HCC)    Arthritis 11/24/2020   Bilateral carpal tunnel syndrome 11/24/2020   Chronic left shoulder pain 11/24/2020   GERD (gastroesophageal reflux disease) 11/24/2020   Hyperlipidemia LDL goal <100 01/31/2016   Hypertension    Leg cramps 11/24/2020   Past Surgical History:  Procedure Laterality Date   LEG SURGERY Right 1975   Patient Active Problem List   Diagnosis Date Noted   Statin intolerance 02/05/2023   Chronic midline low back pain with left-sided sciatica 02/05/2023   Allergic reaction to plant, excluding food 02/05/2023   Bee sting allergy 01/23/2022   AAA (abdominal aortic aneurysm) without rupture (HCC) 09/19/2021   Shortness of breath 08/13/2021   Seasonal allergies 01/09/2021   Seborrheic dermatitis 01/09/2021   History of colon polyps 01/09/2021   Aortic atherosclerosis (HCC)    GERD (gastroesophageal reflux disease) 11/24/2020   Arthritis 11/24/2020   Leg cramps 11/24/2020   Bilateral carpal tunnel syndrome 11/24/2020   Chronic left shoulder pain 11/24/2020   Hyperlipidemia LDL goal <100 01/31/2016   REFERRING PROVIDER: Gabriel Earing, FNP   REFERRING DIAG: Chronic left shoulder pain   THERAPY DIAG:  Chronic left shoulder pain  Stiffness of left shoulder, not elsewhere classified  Rationale for Evaluation and Treatment:  Rehabilitation  ONSET DATE: 15 years ago  SUBJECTIVE:                                                                                                                                                                                      SUBJECTIVE STATEMENT: Patient reports that his shoulder hurts a little, but it is better than it has been.   Hand dominance: Right  PERTINENT HISTORY: Abdominal aortic aneurysm, carpal tunnel, arthritis, carpal tunnel, and hypertension   PAIN:  Are you having pain? Yes: NPRS scale: 2/10 Pain location: left shoulder Pain description: toothache Aggravating factors: movement, sleeping on his left side (this is his preferred  position) Relieving factors: medication   PRECAUTIONS: Fall  WEIGHT BEARING RESTRICTIONS: No  FALLS:  Has patient fallen in last 6 months? Yes. Number of falls 1  PLOF: Independent  PATIENT GOALS: reduced pain, improved mobility, and improved sleep (3 hours prior to being awakened by his shoulder pain)   NEXT MD VISIT: 08/11/23  OBJECTIVE:   PATIENT SURVEYS:  FOTO 58.55  POSTURE: Forward head and rounded shoulders  UPPER EXTREMITY ROM:   Active ROM Right eval Left eval  Shoulder flexion 142 119; "painful if I go further"   Shoulder extension    Shoulder abduction 155 117; "painful if I go further"   Shoulder adduction    Shoulder internal rotation To T12; discomfort  To L2   Shoulder external rotation To T4 To spine of scapula; crepitus and some pain  Elbow flexion    Elbow extension    Wrist flexion    Wrist extension    Wrist ulnar deviation    Wrist radial deviation    Wrist pronation    Wrist supination    (Blank rows = not tested)  UPPER EXTREMITY MMT:  MMT Right eval Left eval  Shoulder flexion 4/5 4/5; painful  Shoulder extension    Shoulder abduction 4+/5 4+/5; painful   Shoulder adduction    Shoulder internal rotation 4+/5 4/5; discomfort  Shoulder external rotation 4+/5 4/5; discomfort   Middle trapezius    Lower trapezius 5/5 5/5  Elbow flexion 4+/5 4+/5  Elbow extension    Wrist flexion    Wrist extension    Wrist ulnar deviation    Wrist radial deviation    Wrist pronation    Wrist supination    Grip strength (lbs)    (Blank rows = not tested)  PALPATION:  No significant tenderness to palpation    TODAY'S TREATMENT:                                                                                                                                         DATE:                                   03/11/23 EXERCISE LOG  Exercise Repetitions and Resistance Comments  Pulleys 3.5 minutes   Resisted IR Red t-band x 2 minutes   Resisted ER  Red t-band x 20 reps   Resisted pull down  Blue XTS x 3 minutes   Doorway stretch  2 minutes   Cybex pull up  50# x 25 reps each  With pronated and supinated forearm  Resisted row  Blue t-band x 30 reps  At 90 degrees abduction   Blank cell = exercise not performed today         02/27/23   EXERCISE LOG  Exercise Repetitions and Resistance Comments  Pulleys 3 mins More  elbow related pain.  Wall slides with ER X15 reps   Wall ladder 10 reps (max 29)   Isometrics ER/IR/flex 5 x5 sec holds Greater pain in hand with flex and more pain in ER  UBE 90 RPM x10 min (forward/backward)    Blank cell = exercise not performed today   Manual Therapy Soft Tissue Mobilization: right upper trap, STW/M to right upper trap and cervical paraspinals to decrease pain and tone with good results    Modalities  Date: 02/27/23 Unattended Estim: Shoulder, IFC 80-150 Hz, 15 mins, Pain Hot Pack: Shoulder, 15 mins, Pain and Tone   PATIENT EDUCATION: Education details: POC, healing, prognosis, anatomy, benefits of exercise, and goals for therapy Person educated: Patient Education method: Explanation Education comprehension: verbalized understanding  HOME EXERCISE PROGRAM:  ASSESSMENT:  CLINICAL IMPRESSION: Patient was progressed cybex pull ups in  addition to familiar interventions for improved rotator cuff and scapular strengthening.  He required minimal cueing with resisted external rotation for proper biomechanics to isolate infraspinatus engagement. He reported feeling a little sore, but no increase in pain upon the conclusion of treatment. He continues to require skilled physical therapy to address his remaining impairments to return to his prior level of function.    OBJECTIVE IMPAIRMENTS: decreased activity tolerance, decreased mobility, decreased ROM, decreased strength, hypomobility, impaired UE functional use, postural dysfunction, and pain.   ACTIVITY LIMITATIONS: lifting, sleeping, and reach over head  PARTICIPATION LIMITATIONS: community activity and yard work  PERSONAL FACTORS: Time since onset of injury/illness/exacerbation and 3+ comorbidities: Abdominal aortic aneurysm, carpal tunnel, arthritis, carpal tunnel, and hypertension   are also affecting patient's functional outcome.   REHAB POTENTIAL: Good  CLINICAL DECISION MAKING: Evolving/moderate complexity  EVALUATION COMPLEXITY: Moderate   GOALS: Goals reviewed with patient? Yes  SHORT TERM GOALS: Target date: 03/05/23  Patient will be independent with his initial HEP.  Baseline: Goal status: MET  2.  Patient will be able to complete his daily activities without his familiar pain exceeding 5/10.  Baseline: 5/23: depends on the day or activity Goal status: IN PROGRESS  3.  Patient will be able to demonstrate at least 120 degrees of left shoulder flexion for improved function reaching overhead.  Baseline: 5/23: 130 degrees Goal status: MET  4.  Patient will be able to demonstrate at least 125 degrees of left shoulder abduction for improved function with overhead activity.  Baseline: 5/23: 122 degrees Goal status: IN PROGRESS  LONG TERM GOALS: Target date: 03/26/23  Patient will be independent with his advanced HEP.  Baseline:  Goal status: IN  PROGRESS  2.  Patient will be able to complete his daily activities without his familiar pain exceeding 3/10.  Baseline:  Goal status: IN PROGRESS  3.  Patient will be able to demonstrate at least 130 degrees of left shoulder flexion for improved function reaching overhead.  Baseline: 5/23: 130 degrees Goal status: MET  4.  Patient will be able to demonstrate at least 135 degrees of left shoulder abduction for improved function with overhead activity.  Baseline:  Goal status: IN PROGRESS  5.  Patient will report being able to sleep throughout the night without being awakened by his familiar left shoulder pain.  Baseline:  Goal status: IN PROGRESS  PLAN:  PT FREQUENCY: 2x/week  PT DURATION: 6 weeks  PLANNED INTERVENTIONS: Therapeutic exercises, Therapeutic activity, Neuromuscular re-education, Patient/Family education, Self Care, Joint mobilization, Electrical stimulation, Spinal mobilization, Cryotherapy, Moist heat, Vasopneumatic device, Manual therapy, and Re-evaluation  PLAN FOR NEXT SESSION:  pulleys, rotator cuff strengthening, PROM, and modalities as needed   Granville Lewis, PT 03/11/2023, 9:53 AM

## 2023-03-18 ENCOUNTER — Ambulatory Visit: Payer: Medicare HMO

## 2023-03-18 DIAGNOSIS — D1722 Benign lipomatous neoplasm of skin and subcutaneous tissue of left arm: Secondary | ICD-10-CM | POA: Diagnosis not present

## 2023-03-18 DIAGNOSIS — L814 Other melanin hyperpigmentation: Secondary | ICD-10-CM | POA: Diagnosis not present

## 2023-03-18 DIAGNOSIS — L821 Other seborrheic keratosis: Secondary | ICD-10-CM | POA: Diagnosis not present

## 2023-03-18 DIAGNOSIS — D225 Melanocytic nevi of trunk: Secondary | ICD-10-CM | POA: Diagnosis not present

## 2023-03-18 DIAGNOSIS — L578 Other skin changes due to chronic exposure to nonionizing radiation: Secondary | ICD-10-CM | POA: Diagnosis not present

## 2023-03-20 ENCOUNTER — Ambulatory Visit: Payer: Medicare HMO

## 2023-03-20 DIAGNOSIS — G8929 Other chronic pain: Secondary | ICD-10-CM

## 2023-03-20 DIAGNOSIS — M25612 Stiffness of left shoulder, not elsewhere classified: Secondary | ICD-10-CM

## 2023-03-20 DIAGNOSIS — M25512 Pain in left shoulder: Secondary | ICD-10-CM | POA: Diagnosis not present

## 2023-03-20 NOTE — Therapy (Signed)
OUTPATIENT PHYSICAL THERAPY SHOULDER TREATMENT   Patient Name: James Davenport MRN: [id number] DOB:[birth year], 67 y.o., male Today's Date: 03/20/2023  END OF SESSION:  PT End of Session - 03/20/23 0807     Visit Number 7    Number of Visits 12    Date for PT Re-Evaluation 05/02/23    PT Start Time 0800    PT Stop Time 0845    PT Time Calculation (min) 45 min    Activity Tolerance Patient tolerated treatment well    Behavior During Therapy [facility] for tasks assessed/performed            Past Medical History:  Diagnosis Date   AAA (abdominal aortic aneurysm) without rupture (HCC) 09/19/2021   Aortic atherosclerosis (HCC)    Arthritis 11/24/2020   Bilateral carpal tunnel syndrome 11/24/2020   Chronic left shoulder pain 11/24/2020   GERD (gastroesophageal reflux disease) 11/24/2020   Hyperlipidemia LDL goal <100 01/31/2016   Hypertension    Leg cramps 11/24/2020   Past Surgical History:  Procedure Laterality Date   LEG SURGERY Right 1975   Patient Active Problem List   Diagnosis Date Noted   Statin intolerance 02/05/2023   Chronic midline low back pain with left-sided sciatica 02/05/2023   Allergic reaction to plant, excluding food 02/05/2023   Bee sting allergy 01/23/2022   AAA (abdominal aortic aneurysm) without rupture (HCC) 09/19/2021   Shortness of breath 08/13/2021   Seasonal allergies 01/09/2021   Seborrheic dermatitis 01/09/2021   History of colon polyps 01/09/2021   Aortic atherosclerosis (HCC)    GERD (gastroesophageal reflux disease) 11/24/2020   Arthritis 11/24/2020   Leg cramps 11/24/2020   Bilateral carpal tunnel syndrome 11/24/2020   Chronic left shoulder pain 11/24/2020   Hyperlipidemia LDL goal <100 01/31/2016   REFERRING PROVIDER: Gabriel Earing, FNP   REFERRING DIAG: Chronic left shoulder pain   THERAPY DIAG:  Chronic left shoulder pain  Stiffness of left shoulder, not elsewhere classified  Rationale for Evaluation and Treatment:  Rehabilitation  ONSET DATE: 15 years ago  SUBJECTIVE:                                                                                                                                                                                      SUBJECTIVE STATEMENT: Pt 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   Hand dominance: Right  PERTINENT HISTORY: Abdominal aortic aneurysm, carpal tunnel, arthritis, carpal tunnel, and hypertension   PAIN:  Are you having pain? Yes: NPRS scale: 3/10 Pain location: left shoulder Pain description: toothache Aggravating factors: movement, sleeping on his left side (this is his preferred position) Relieving factors: medication  PRECAUTIONS: Fall  WEIGHT BEARING RESTRICTIONS: No  FALLS:  Has patient fallen in last 6 months? Yes. Number of falls 1  PLOF: Independent  PATIENT GOALS: reduced pain, improved mobility, and improved sleep (3 hours prior to being awakened by his shoulder pain)   NEXT MD VISIT: 08/11/23  OBJECTIVE:   PATIENT SURVEYS:  FOTO 58.55  POSTURE: Forward head and rounded shoulders  UPPER EXTREMITY ROM:   Active ROM Right eval Left eval  Shoulder flexion 142 119; "painful if I go further"   Shoulder extension    Shoulder abduction 155 117; "painful if I go further"   Shoulder adduction    Shoulder internal rotation To T12; discomfort  To L2   Shoulder external rotation To T4 To spine of scapula; crepitus and some pain  Elbow flexion    Elbow extension    Wrist flexion    Wrist extension    Wrist ulnar deviation    Wrist radial deviation    Wrist pronation    Wrist supination    (Blank rows = not tested)  UPPER EXTREMITY MMT:  MMT Right eval Left eval  Shoulder flexion 4/5 4/5; painful  Shoulder extension    Shoulder abduction 4+/5 4+/5; painful   Shoulder adduction    Shoulder internal rotation 4+/5 4/5; discomfort  Shoulder external rotation 4+/5 4/5; discomfort  Middle trapezius     Lower trapezius 5/5 5/5  Elbow flexion 4+/5 4+/5  Elbow extension    Wrist flexion    Wrist extension    Wrist ulnar deviation    Wrist radial deviation    Wrist pronation    Wrist supination    Grip strength (lbs)    (Blank rows = not tested)  PALPATION:  No significant tenderness to palpation    TODAY'S TREATMENT:                                                                                                                                         DATE:                                   03/20/23 EXERCISE LOG  Exercise Repetitions and Resistance Comments  Pulleys 3.5 minutes   Wall Ladder 3 mins   Resisted IR Red t-band x 2.5 minutes   Resisted ER  Red t-band x 25 reps   Resisted pull down  Blue XTS x 3 minutes   Doorway stretch  2.5 minutes   Cybex pull up  50# x 25 reps each  With pronated and supinated forearm  Resisted row  Blue t-band x 30 reps  At 90 degrees abduction   Blank cell = exercise not performed today            PATIENT EDUCATION: Education details: POC, healing, prognosis, anatomy, benefits of exercise, and goals  for therapy Person educated: Patient Education method: Explanation Education comprehension: verbalized understanding  HOME EXERCISE PROGRAM:  ASSESSMENT:  CLINICAL IMPRESSION: Pt arrives for today's treatment session reporting 3/10 left shoulder pain.  Pt able to tolerate increased reps with the majority of all exercises today with minimal discomfort.  Pt reports being able to use his shoulder for more activities and long duration when working.  Pt reported 2/10 left shoulder pain at completion of today's treatment session.   OBJECTIVE IMPAIRMENTS: decreased activity tolerance, decreased mobility, decreased ROM, decreased strength, hypomobility, impaired UE functional use, postural dysfunction, and pain.   ACTIVITY LIMITATIONS: lifting, sleeping, and reach over head  PARTICIPATION LIMITATIONS: community activity and yard work  PERSONAL  FACTORS: Time since onset of injury/illness/exacerbation and 3+ comorbidities: Abdominal aortic aneurysm, carpal tunnel, arthritis, carpal tunnel, and hypertension   are also affecting patient's functional outcome.   REHAB POTENTIAL: Good  CLINICAL DECISION MAKING: Evolving/moderate complexity  EVALUATION COMPLEXITY: Moderate   GOALS: Goals reviewed with patient? Yes  SHORT TERM GOALS: Target date: 03/05/23  Patient will be independent with his initial HEP.  Baseline: Goal status: MET  2.  Patient will be able to complete his daily activities without his familiar pain exceeding 5/10.  Baseline: 5/23: depends on the day or activity Goal status: IN PROGRESS  3.  Patient will be able to demonstrate at least 120 degrees of left shoulder flexion for improved function reaching overhead.  Baseline: 5/23: 130 degrees Goal status: MET  4.  Patient will be able to demonstrate at least 125 degrees of left shoulder abduction for improved function with overhead activity.  Baseline: 5/23: 122 degrees Goal status: IN PROGRESS  LONG TERM GOALS: Target date: 03/26/23  Patient will be independent with his advanced HEP.  Baseline:  Goal status: IN PROGRESS  2.  Patient will be able to complete his daily activities without his familiar pain exceeding 3/10.  Baseline:  Goal status: IN PROGRESS  3.  Patient will be able to demonstrate at least 130 degrees of left shoulder flexion for improved function reaching overhead.  Baseline: 5/23: 130 degrees Goal status: MET  4.  Patient will be able to demonstrate at least 135 degrees of left shoulder abduction for improved function with overhead activity.  Baseline:  Goal status: IN PROGRESS  5.  Patient will report being able to sleep throughout the night without being awakened by his familiar left shoulder pain.  Baseline:  Goal status: IN PROGRESS  PLAN:  PT FREQUENCY: 2x/week  PT DURATION: 6 weeks  PLANNED INTERVENTIONS: Therapeutic  exercises, Therapeutic activity, Neuromuscular re-education, Patient/Family education, Self Care, Joint mobilization, Electrical stimulation, Spinal mobilization, Cryotherapy, Moist heat, Vasopneumatic device, Manual therapy, and Re-evaluation  PLAN FOR NEXT SESSION: pulleys, rotator cuff strengthening, PROM, and modalities as needed   Newman Pies, PTA 03/20/2023, 9:40 AM

## 2023-03-25 ENCOUNTER — Encounter: Payer: Self-pay | Admitting: Physical Therapy

## 2023-03-25 ENCOUNTER — Ambulatory Visit: Payer: Medicare HMO | Admitting: Physical Therapy

## 2023-03-25 DIAGNOSIS — M25612 Stiffness of left shoulder, not elsewhere classified: Secondary | ICD-10-CM | POA: Diagnosis not present

## 2023-03-25 DIAGNOSIS — G8929 Other chronic pain: Secondary | ICD-10-CM

## 2023-03-25 DIAGNOSIS — M25512 Pain in left shoulder: Secondary | ICD-10-CM | POA: Diagnosis not present

## 2023-03-25 NOTE — Therapy (Signed)
OUTPATIENT PHYSICAL THERAPY SHOULDER TREATMENT   Patient Name: James Davenport MRN: [id number] DOB:[birth year], 67 y.o., male Today's Date: 03/25/2023  END OF SESSION:  PT End of Session - 03/25/23 0759     Visit Number 8    Number of Visits 12    Date for PT Re-Evaluation 05/02/23    PT Start Time 0801    PT Stop Time 0845    PT Time Calculation (min) 44 min    Activity Tolerance Patient tolerated treatment well    Behavior During Therapy [facility] for tasks assessed/performed            Past Medical History:  Diagnosis Date   AAA (abdominal aortic aneurysm) without rupture (HCC) 09/19/2021   Aortic atherosclerosis (HCC)    Arthritis 11/24/2020   Bilateral carpal tunnel syndrome 11/24/2020   Chronic left shoulder pain 11/24/2020   GERD (gastroesophageal reflux disease) 11/24/2020   Hyperlipidemia LDL goal <100 01/31/2016   Hypertension    Leg cramps 11/24/2020   Past Surgical History:  Procedure Laterality Date   LEG SURGERY Right 1975   Patient Active Problem List   Diagnosis Date Noted   Statin intolerance 02/05/2023   Chronic midline low back pain with left-sided sciatica 02/05/2023   Allergic reaction to plant, excluding food 02/05/2023   Bee sting allergy 01/23/2022   AAA (abdominal aortic aneurysm) without rupture (HCC) 09/19/2021   Shortness of breath 08/13/2021   Seasonal allergies 01/09/2021   Seborrheic dermatitis 01/09/2021   History of colon polyps 01/09/2021   Aortic atherosclerosis (HCC)    GERD (gastroesophageal reflux disease) 11/24/2020   Arthritis 11/24/2020   Leg cramps 11/24/2020   Bilateral carpal tunnel syndrome 11/24/2020   Chronic left shoulder pain 11/24/2020   Hyperlipidemia LDL goal <100 01/31/2016   REFERRING PROVIDER: Gabriel Earing, FNP   REFERRING DIAG: Chronic left shoulder pain   THERAPY DIAG:  Chronic left shoulder pain  Stiffness of left shoulder, not elsewhere classified  Rationale for Evaluation and Treatment:  Rehabilitation  ONSET DATE: 15 years ago  SUBJECTIVE:                                                                                                                                                                                      SUBJECTIVE STATEMENT: Reports today is a good day with minimal pain.  Hand dominance: Right  PERTINENT HISTORY: Abdominal aortic aneurysm, carpal tunnel, arthritis, carpal tunnel, and hypertension   PAIN:  Are you having pain? Yes: NPRS scale: 1-2/10 Pain location: left shoulder Pain description: toothache Aggravating factors: movement, sleeping on his left side (this is his preferred position) Relieving factors: medication   PRECAUTIONS: Fall  WEIGHT BEARING RESTRICTIONS: No  FALLS:  Has patient fallen in last 6 months? Yes. Number of falls 1  PLOF: Independent  PATIENT GOALS: reduced pain, improved mobility, and improved sleep (3 hours prior to being awakened by his shoulder pain)   NEXT MD VISIT: 08/11/23  OBJECTIVE:   PATIENT SURVEYS:  FOTO 56  POSTURE: Forward head and rounded shoulders  UPPER EXTREMITY ROM:   Active ROM Right eval Left eval  Shoulder flexion 142 119; "painful if I go further"   Shoulder extension    Shoulder abduction 155 117; "painful if I go further"   Shoulder adduction    Shoulder internal rotation To T12; discomfort  To L2   Shoulder external rotation To T4 To spine of scapula; crepitus and some pain  Elbow flexion    Elbow extension    Wrist flexion    Wrist extension    Wrist ulnar deviation    Wrist radial deviation    Wrist pronation    Wrist supination    (Blank rows = not tested)  UPPER EXTREMITY MMT:  MMT Right eval Left eval  Shoulder flexion 4/5 4/5; painful  Shoulder extension    Shoulder abduction 4+/5 4+/5; painful   Shoulder adduction    Shoulder internal rotation 4+/5 4/5; discomfort  Shoulder external rotation 4+/5 4/5; discomfort  Middle trapezius    Lower trapezius 5/5  5/5  Elbow flexion 4+/5 4+/5  Elbow extension    Wrist flexion    Wrist extension    Wrist ulnar deviation    Wrist radial deviation    Wrist pronation    Wrist supination    Grip strength (lbs)    (Blank rows = not tested)  PALPATION:  No significant tenderness to palpation    TODAY'S TREATMENT:                                                                                                                                         DATE: 03/25/23 EXERCISE LOG  Exercise Repetitions and Resistance Comments  UBE 60 RPM x10 min   Pulleys 4 min   Resisted IR Red t-band x30 reps   Resisted ER  Red t-band x 25 reps   Resisted protraction Red theraband x25 reps   Resisted pull down  Blue XTS x30 reps   Doorway stretch  3 minutes of dynamic stretching   Resisted row  Blue XTS x 30 reps  At 90 degrees abduction   Blank cell = exercise not performed today   PATIENT EDUCATION: Education details: POC, healing, prognosis, anatomy, benefits of exercise, and goals for therapy Person educated: Patient Education method: Explanation Education comprehension: verbalized understanding  HOME EXERCISE PROGRAM:  ASSESSMENT:  CLINICAL IMPRESSION: Patient presented in clinic with reports of mild L shoulder pain today. Patient progressed through shoulder strengthening and stabilization exercises with only reports of pain with resisted row and doorway stretch.  Muscle fatigue reported with wall wash for stabilization. Patient reports still limited with reaching behind his back/ IR. No modalities due to low pain rating.  OBJECTIVE IMPAIRMENTS: decreased activity tolerance, decreased mobility, decreased ROM, decreased strength, hypomobility, impaired UE functional use, postural dysfunction, and pain.   ACTIVITY LIMITATIONS: lifting, sleeping, and reach over head  PARTICIPATION LIMITATIONS: community activity and yard work  PERSONAL FACTORS: Time since onset of injury/illness/exacerbation and 3+  comorbidities: Abdominal aortic aneurysm, carpal tunnel, arthritis, carpal tunnel, and hypertension   are also affecting patient's functional outcome.   REHAB POTENTIAL: Good  CLINICAL DECISION MAKING: Evolving/moderate complexity  EVALUATION COMPLEXITY: Moderate   GOALS: Goals reviewed with patient? Yes  SHORT TERM GOALS: Target date: 03/05/23  Patient will be independent with his initial HEP.  Baseline: Goal status: MET  2.  Patient will be able to complete his daily activities without his familiar pain exceeding 5/10.  Baseline: 5/23: depends on the day or activity Goal status: PARTIALLY MET  3.  Patient will be able to demonstrate at least 120 degrees of left shoulder flexion for improved function reaching overhead.  Baseline: 5/23: 130 degrees Goal status: MET  4.  Patient will be able to demonstrate at least 125 degrees of left shoulder abduction for improved function with overhead activity.  Baseline: 5/23: 122 degrees Goal status: IN PROGRESS  LONG TERM GOALS: Target date: 03/26/23  Patient will be independent with his advanced HEP.  Baseline:  Goal status: IN PROGRESS  2.  Patient will be able to complete his daily activities without his familiar pain exceeding 3/10.  Baseline:  Goal status: IN PROGRESS  3.  Patient will be able to demonstrate at least 130 degrees of left shoulder flexion for improved function reaching overhead.  Baseline: 5/23: 130 degrees Goal status: MET  4.  Patient will be able to demonstrate at least 135 degrees of left shoulder abduction for improved function with overhead activity.  Baseline:  Goal status: IN PROGRESS  5.  Patient will report being able to sleep throughout the night without being awakened by his familiar left shoulder pain.  Baseline:  Goal status: PARTIALLY MET  PLAN:  PT FREQUENCY: 2x/week  PT DURATION: 6 weeks  PLANNED INTERVENTIONS: Therapeutic exercises, Therapeutic activity, Neuromuscular re-education,  Patient/Family education, Self Care, Joint mobilization, Electrical stimulation, Spinal mobilization, Cryotherapy, Moist heat, Vasopneumatic device, Manual therapy, and Re-evaluation  PLAN FOR NEXT SESSION: pulleys, rotator cuff strengthening, PROM, and modalities as needed  Marvell Fuller, PTA 03/25/2023, 9:01 AM

## 2023-03-26 ENCOUNTER — Other Ambulatory Visit: Payer: Self-pay | Admitting: Family Medicine

## 2023-03-26 DIAGNOSIS — R0602 Shortness of breath: Secondary | ICD-10-CM

## 2023-03-31 ENCOUNTER — Ambulatory Visit: Payer: Medicare HMO | Admitting: Physical Therapy

## 2023-03-31 ENCOUNTER — Encounter: Payer: Self-pay | Admitting: Physical Therapy

## 2023-03-31 DIAGNOSIS — G8929 Other chronic pain: Secondary | ICD-10-CM

## 2023-03-31 DIAGNOSIS — M25612 Stiffness of left shoulder, not elsewhere classified: Secondary | ICD-10-CM

## 2023-03-31 DIAGNOSIS — M25512 Pain in left shoulder: Secondary | ICD-10-CM | POA: Diagnosis not present

## 2023-03-31 NOTE — Therapy (Addendum)
OUTPATIENT PHYSICAL THERAPY SHOULDER TREATMENT   Patient Name: James Davenport MRN: [id number] DOB:October 22, 1955, 67 y.o., male Today's Date: 03/31/2023  END OF SESSION:  PT End of Session - 03/31/23 0806     Visit Number 9    Number of Visits 12    Date for PT Re-Evaluation 05/02/23    PT Start Time 0801    PT Stop Time 0855    PT Time Calculation (min) 54 min    Activity Tolerance Patient tolerated treatment well    Behavior During Therapy [facility] for tasks assessed/performed            Past Medical History:  Diagnosis Date   AAA (abdominal aortic aneurysm) without rupture (HCC) 09/19/2021   Aortic atherosclerosis (HCC)    Arthritis 11/24/2020   Bilateral carpal tunnel syndrome 11/24/2020   Chronic left shoulder pain 11/24/2020   GERD (gastroesophageal reflux disease) 11/24/2020   Hyperlipidemia LDL goal <100 01/31/2016   Hypertension    Leg cramps 11/24/2020   Past Surgical History:  Procedure Laterality Date   LEG SURGERY Right 1975   Patient Active Problem List   Diagnosis Date Noted   Statin intolerance 02/05/2023   Chronic midline low back pain with left-sided sciatica 02/05/2023   Allergic reaction to plant, excluding food 02/05/2023   Bee sting allergy 01/23/2022   AAA (abdominal aortic aneurysm) without rupture (HCC) 09/19/2021   Shortness of breath 08/13/2021   Seasonal allergies 01/09/2021   Seborrheic dermatitis 01/09/2021   History of colon polyps 01/09/2021   Aortic atherosclerosis (HCC)    GERD (gastroesophageal reflux disease) 11/24/2020   Arthritis 11/24/2020   Leg cramps 11/24/2020   Bilateral carpal tunnel syndrome 11/24/2020   Chronic left shoulder pain 11/24/2020   Hyperlipidemia LDL goal <100 01/31/2016   REFERRING PROVIDER: Gabriel Earing, FNP   REFERRING DIAG: Chronic left shoulder pain   THERAPY DIAG:  Chronic left shoulder pain  Stiffness of left shoulder, not elsewhere classified  Rationale for Evaluation and Treatment:  Rehabilitation  ONSET DATE: 15 years ago  SUBJECTIVE:                                                                                                                                                                                      SUBJECTIVE STATEMENT: Reports that he did a lot of work in the garden this weekend.  Hand dominance: Right  PERTINENT HISTORY: Abdominal aortic aneurysm, carpal tunnel, arthritis, carpal tunnel, and hypertension   PAIN:  Are you having pain? Yes: NPRS scale: 2/10 Pain location: left shoulder Pain description: toothache Aggravating factors: movement, sleeping on his left side (this is his preferred position) Relieving factors: medication  PRECAUTIONS: Fall  WEIGHT BEARING RESTRICTIONS: No  FALLS:  Has patient fallen in last 6 months? Yes. Number of falls 1  PLOF: Independent  PATIENT GOALS: reduced pain, improved mobility, and improved sleep (3 hours prior to being awakened by his shoulder pain)   NEXT MD VISIT: 08/11/23  OBJECTIVE:   PATIENT SURVEYS:  FOTO 56  POSTURE: Forward head and rounded shoulders  UPPER EXTREMITY ROM:   Active ROM Right eval Left eval Left 03/31/23  Shoulder flexion 142 119; "painful if I go further"    Shoulder extension     Shoulder abduction 155 117; "painful if I go further"  94; painful but could push more  Shoulder adduction     Shoulder internal rotation To T12; discomfort  To L2    Shoulder external rotation To T4 To spine of scapula; crepitus and some pain   Elbow flexion     Elbow extension     Wrist flexion     Wrist extension     Wrist ulnar deviation     Wrist radial deviation     Wrist pronation     Wrist supination     (Blank rows = not tested)  UPPER EXTREMITY MMT:  MMT Right eval Left eval  Shoulder flexion 4/5 4/5; painful  Shoulder extension    Shoulder abduction 4+/5 4+/5; painful   Shoulder adduction    Shoulder internal rotation 4+/5 4/5; discomfort  Shoulder external  rotation 4+/5 4/5; discomfort  Middle trapezius    Lower trapezius 5/5 5/5  Elbow flexion 4+/5 4+/5  Elbow extension    Wrist flexion    Wrist extension    Wrist ulnar deviation    Wrist radial deviation    Wrist pronation    Wrist supination    Grip strength (lbs)    (Blank rows = not tested)  TODAY'S TREATMENT:                                                                                                                                         DATE: 03/31/23 EXERCISE LOG  Exercise Repetitions and Resistance Comments  UBE 60 RPM x10 min   Pulleys 5 min   Wall wash  X20 reps    Resisted IR Red t-band x30 reps   Resisted ER  Red t-band x 30 reps   Resisted protraction Red theraband x30 reps Mild popping with each rep  Resisted extension Blue theraband x30 reps   Mini wall pushups X30 reps   Resisted row  Blue theraband x 30 reps     Blank cell = exercise not performed today   Modalities  Date: 03/31/23 Unattended Estim: Shoulder, Pre-Mod, 15 mins, Pain Hot Pack: Shoulder, 15 mins, Pain  PATIENT EDUCATION: Education details: POC, healing, prognosis, anatomy, benefits of exercise, and goals for therapy Person educated: Patient Education method: Explanation Education comprehension: verbalized understanding  HOME EXERCISE PROGRAM:  ASSESSMENT:  CLINICAL IMPRESSION: Patient presented in clinic with mild discomfort after garden activities this weekend. Patient reported mild discomfort with ER/IR and shoulder protraction. Extra monitoring and consideration provided due to elbow pain as well. Patient reports seeing improvement with overall shoulder function but AROM abduction limited by pain still. Normal modalities response noted following removal of the modalities.  OBJECTIVE IMPAIRMENTS: decreased activity tolerance, decreased mobility, decreased ROM, decreased strength, hypomobility, impaired UE functional use, postural dysfunction, and pain.   ACTIVITY LIMITATIONS:  lifting, sleeping, and reach over head  PARTICIPATION LIMITATIONS: community activity and yard work  PERSONAL FACTORS: Time since onset of injury/illness/exacerbation and 3+ comorbidities: Abdominal aortic aneurysm, carpal tunnel, arthritis, carpal tunnel, and hypertension   are also affecting patient's functional outcome.   REHAB POTENTIAL: Good  CLINICAL DECISION MAKING: Evolving/moderate complexity  EVALUATION COMPLEXITY: Moderate  GOALS: Goals reviewed with patient? Yes  SHORT TERM GOALS: Target date: 03/05/23  Patient will be independent with his initial HEP.  Baseline: Goal status: MET  2.  Patient will be able to complete his daily activities without his familiar pain exceeding 5/10.  Baseline: 5/23: depends on the day or activity Goal status: PARTIALLY MET  3.  Patient will be able to demonstrate at least 120 degrees of left shoulder flexion for improved function reaching overhead.  Baseline: 5/23: 130 degrees Goal status: MET  4.  Patient will be able to demonstrate at least 125 degrees of left shoulder abduction for improved function with overhead activity.  Baseline: 5/23: 122 degrees Goal status: IN PROGRESS  LONG TERM GOALS: Target date: 03/26/23  Patient will be independent with his advanced HEP.  Baseline:  Goal status: IN PROGRESS  2.  Patient will be able to complete his daily activities without his familiar pain exceeding 3/10.  Baseline:  Goal status: IN PROGRESS  3.  Patient will be able to demonstrate at least 130 degrees of left shoulder flexion for improved function reaching overhead.  Baseline: 5/23: 130 degrees Goal status: MET  4.  Patient will be able to demonstrate at least 135 degrees of left shoulder abduction for improved function with overhead activity.  Baseline:  Goal status: IN PROGRESS  5.  Patient will report being able to sleep throughout the night without being awakened by his familiar left shoulder pain.  Baseline:  Goal  status: PARTIALLY MET  PLAN:  PT FREQUENCY: 2x/week  PT DURATION: 6 weeks  PLANNED INTERVENTIONS: Therapeutic exercises, Therapeutic activity, Neuromuscular re-education, Patient/Family education, Self Care, Joint mobilization, Electrical stimulation, Spinal mobilization, Cryotherapy, Moist heat, Vasopneumatic device, Manual therapy, and Re-evaluation  PLAN FOR NEXT SESSION: pulleys, rotator cuff strengthening, PROM, and modalities as needed  Marvell Fuller, PTA 03/31/2023, 9:04 AM

## 2023-04-07 ENCOUNTER — Ambulatory Visit: Payer: Medicare HMO | Attending: Family Medicine

## 2023-04-07 DIAGNOSIS — M25612 Stiffness of left shoulder, not elsewhere classified: Secondary | ICD-10-CM | POA: Insufficient documentation

## 2023-04-07 DIAGNOSIS — M25512 Pain in left shoulder: Secondary | ICD-10-CM | POA: Insufficient documentation

## 2023-04-07 DIAGNOSIS — G8929 Other chronic pain: Secondary | ICD-10-CM | POA: Diagnosis not present

## 2023-04-07 NOTE — Therapy (Addendum)
OUTPATIENT PHYSICAL THERAPY SHOULDER TREATMENT   Patient Name: James Davenport MRN: [id number] DOB:[birth year], 67 y.o., male Today's Date: 04/07/2023  END OF SESSION:  PT End of Session - 04/07/23 0807     Visit Number 10    Number of Visits 12    Date for PT Re-Evaluation 05/02/23    PT Start Time 0800    PT Stop Time 0859    PT Time Calculation (min) 59 min    Activity Tolerance Patient tolerated treatment well    Behavior During Therapy [facility] for tasks assessed/performed            Past Medical History:  Diagnosis Date   AAA (abdominal aortic aneurysm) without rupture (HCC) 09/19/2021   Aortic atherosclerosis (HCC)    Arthritis 11/24/2020   Bilateral carpal tunnel syndrome 11/24/2020   Chronic left shoulder pain 11/24/2020   GERD (gastroesophageal reflux disease) 11/24/2020   Hyperlipidemia LDL goal <100 01/31/2016   Hypertension    Leg cramps 11/24/2020   Past Surgical History:  Procedure Laterality Date   LEG SURGERY Right 1975   Patient Active Problem List   Diagnosis Date Noted   Statin intolerance 02/05/2023   Chronic midline low back pain with left-sided sciatica 02/05/2023   Allergic reaction to plant, excluding food 02/05/2023   Bee sting allergy 01/23/2022   AAA (abdominal aortic aneurysm) without rupture (HCC) 09/19/2021   Shortness of breath 08/13/2021   Seasonal allergies 01/09/2021   Seborrheic dermatitis 01/09/2021   History of colon polyps 01/09/2021   Aortic atherosclerosis (HCC)    GERD (gastroesophageal reflux disease) 11/24/2020   Arthritis 11/24/2020   Leg cramps 11/24/2020   Bilateral carpal tunnel syndrome 11/24/2020   Chronic left shoulder pain 11/24/2020   Hyperlipidemia LDL goal <100 01/31/2016   REFERRING PROVIDER: Gabriel Earing, FNP   REFERRING DIAG: Chronic left shoulder pain   THERAPY DIAG:  Chronic left shoulder pain  Stiffness of left shoulder, not elsewhere classified  Rationale for Evaluation and Treatment:  Rehabilitation  ONSET DATE: 15 years ago  SUBJECTIVE:                                                                                                                                                                                      SUBJECTIVE STATEMENT: Pt reports increased soreness today due to work done over the weekend.   Hand dominance: Right  PERTINENT HISTORY: Abdominal aortic aneurysm, carpal tunnel, arthritis, carpal tunnel, and hypertension   PAIN:  Are you having pain? Yes: NPRS scale: 4-5/10 Pain location: left shoulder Pain description: toothache Aggravating factors: movement, sleeping on his left side (this is his preferred position) Relieving factors: medication  PRECAUTIONS: Fall  WEIGHT BEARING RESTRICTIONS: No  FALLS:  Has patient fallen in last 6 months? Yes. Number of falls 1  PLOF: Independent  PATIENT GOALS: reduced pain, improved mobility, and improved sleep (3 hours prior to being awakened by his shoulder pain)   NEXT MD VISIT: 08/11/23  OBJECTIVE:   PATIENT SURVEYS:  FOTO 56  POSTURE: Forward head and rounded shoulders  UPPER EXTREMITY ROM:   Active ROM Right eval Left eval Left 03/31/23  Shoulder flexion 142 119; "painful if I go further"    Shoulder extension     Shoulder abduction 155 117; "painful if I go further"  94; painful but could push more  Shoulder adduction     Shoulder internal rotation To T12; discomfort  To L2    Shoulder external rotation To T4 To spine of scapula; crepitus and some pain   Elbow flexion     Elbow extension     Wrist flexion     Wrist extension     Wrist ulnar deviation     Wrist radial deviation     Wrist pronation     Wrist supination     (Blank rows = not tested)  UPPER EXTREMITY MMT:  MMT Right eval Left eval  Shoulder flexion 4/5 4/5; painful  Shoulder extension    Shoulder abduction 4+/5 4+/5; painful   Shoulder adduction    Shoulder internal rotation 4+/5 4/5; discomfort   Shoulder external rotation 4+/5 4/5; discomfort  Middle trapezius    Lower trapezius 5/5 5/5  Elbow flexion 4+/5 4+/5  Elbow extension    Wrist flexion    Wrist extension    Wrist ulnar deviation    Wrist radial deviation    Wrist pronation    Wrist supination    Grip strength (lbs)    (Blank rows = not tested)  TODAY'S TREATMENT:                                                                                                                                         DATE: 04/07/23 EXERCISE LOG  Exercise Repetitions and Resistance Comments  UBE 60 RPM x10 min   Pulleys 5 min   Wall wash  X20 reps    Resisted IR Red t-band x30 reps   Resisted ER  Red t-band x 30 reps   Resisted protraction Red theraband 2 sets of 20   Resisted extension Blue theraband x20 reps Increased pain today  Mini wall pushups X30 reps   Resisted row  Blue theraband 2 sets fo 20    Blank cell = exercise not performed today   Modalities  Date: 04/07/23 Unattended Estim: Shoulder, Pre-Mod, 15 mins, Pain Hot Pack: Shoulder, 15 mins, Pain  PATIENT EDUCATION: Education details: POC, healing, prognosis, anatomy, benefits of exercise, and goals for therapy Person educated: Patient Education method: Explanation Education comprehension: verbalized understanding  HOME EXERCISE PROGRAM:  ASSESSMENT:  CLINICAL IMPRESSION: Pt arrives for today's treatment session reporting 4-5/10 left shoulder pain.  Pt reports increased pain today due to work performed over the weekend.  Pt continues to be limited by pain when performing his ADLs and is unable to sleep through the night due shoulder pain.  Pt able to demonstrate 135 degrees of left shoulder abduction, today meeting his LTG.  Pt able to tolerate increased reps with several t-band exercises today with minimal discomfort.  Normal responses to estim and vaso noted upon removal.  Pt reported 3/10 left shoulder pain at completion of today's treatment session.  04/07/23  PROGRESS REPORT: Patient is making good progress with skilled physical therapy as evidenced by his objective measures, functional mobility, and progress towards his goals. He was able to meet all of his short-term goals and most of his long-term goals for therapy at this time. He is also able to demonstrate a significant improvement in left shoulder range of motion since his initial evaluation. However, he continues to experience elevated pain levels which inhibit his ability to sleep and complete functional activities. Recommend that he continue with his current plan of care to address his remaining impairments to maximize his functional mobility.  Candi Leash, PT, DPT   OBJECTIVE IMPAIRMENTS: decreased activity tolerance, decreased mobility, decreased ROM, decreased strength, hypomobility, impaired UE functional use, postural dysfunction, and pain.   ACTIVITY LIMITATIONS: lifting, sleeping, and reach over head  PARTICIPATION LIMITATIONS: community activity and yard work  PERSONAL FACTORS: Time since onset of injury/illness/exacerbation and 3+ comorbidities: Abdominal aortic aneurysm, carpal tunnel, arthritis, carpal tunnel, and hypertension   are also affecting patient's functional outcome.   REHAB POTENTIAL: Good  CLINICAL DECISION MAKING: Evolving/moderate complexity  EVALUATION COMPLEXITY: Moderate  GOALS: Goals reviewed with patient? Yes  SHORT TERM GOALS: Target date: 03/05/23  Patient will be independent with his initial HEP.  Baseline: Goal status: MET  2.  Patient will be able to complete his daily activities without his familiar pain exceeding 5/10.  Baseline: 5/23: depends on the day or activity Goal status: MET  3.  Patient will be able to demonstrate at least 120 degrees of left shoulder flexion for improved function reaching overhead.  Baseline: 5/23: 130 degrees Goal status: MET  4.  Patient will be able to demonstrate at least 125 degrees of left shoulder  abduction for improved function with overhead activity.  Baseline: 5/23: 122 degrees Goal status: MET  LONG TERM GOALS: Target date: 03/26/23  Patient will be independent with his advanced HEP.  Baseline:  Goal status: MET  2.  Patient will be able to complete his daily activities without his familiar pain exceeding 3/10.  Baseline: 7/1: Depends on the day Goal status: IN PROGRESS  3.  Patient will be able to demonstrate at least 130 degrees of left shoulder flexion for improved function reaching overhead.  Baseline: 5/23: 130 degrees Goal status: MET  4.  Patient will be able to demonstrate at least 135 degrees of left shoulder abduction for improved function with overhead activity.  Baseline: 7/1: 135 degrees Goal status: MET  5.  Patient will report being able to sleep throughout the night without being awakened by his familiar left shoulder pain.  Baseline:  Goal status: PARTIALLY MET  PLAN:  PT FREQUENCY: 2x/week  PT DURATION: 6 weeks  PLANNED INTERVENTIONS: Therapeutic exercises, Therapeutic activity, Neuromuscular re-education, Patient/Family education, Self Care, Joint mobilization, Electrical stimulation, Spinal mobilization, Cryotherapy, Moist heat, Vasopneumatic device, Manual therapy, and Re-evaluation  PLAN  FOR NEXT SESSION: pulleys, rotator cuff strengthening, PROM, and modalities as needed  Newman Pies, PTA 04/07/2023, 9:02 AM

## 2023-04-08 ENCOUNTER — Ambulatory Visit: Payer: Medicare HMO

## 2023-04-14 ENCOUNTER — Encounter: Payer: Self-pay | Admitting: Physical Therapy

## 2023-04-14 ENCOUNTER — Ambulatory Visit: Payer: Medicare HMO | Admitting: Physical Therapy

## 2023-04-14 DIAGNOSIS — M25612 Stiffness of left shoulder, not elsewhere classified: Secondary | ICD-10-CM | POA: Diagnosis not present

## 2023-04-14 DIAGNOSIS — M25512 Pain in left shoulder: Secondary | ICD-10-CM | POA: Diagnosis not present

## 2023-04-14 DIAGNOSIS — G8929 Other chronic pain: Secondary | ICD-10-CM | POA: Diagnosis not present

## 2023-04-14 NOTE — Therapy (Signed)
OUTPATIENT PHYSICAL THERAPY SHOULDER TREATMENT   Patient Name: James Davenport MRN: [id number] DOB:30-Nov-1955, 67 y.o., male Today's Date: 04/14/2023  END OF SESSION:  PT End of Session - 04/14/23 0805     Visit Number 11    Number of Visits 12    Date for PT Re-Evaluation 05/02/23    PT Start Time 0803    PT Stop Time 0846    PT Time Calculation (min) 43 min    Activity Tolerance Patient tolerated treatment well    Behavior During Therapy [facility] for tasks assessed/performed            Past Medical History:  Diagnosis Date   AAA (abdominal aortic aneurysm) without rupture (HCC) 09/19/2021   Aortic atherosclerosis (HCC)    Arthritis 11/24/2020   Bilateral carpal tunnel syndrome 11/24/2020   Chronic left shoulder pain 11/24/2020   GERD (gastroesophageal reflux disease) 11/24/2020   Hyperlipidemia LDL goal <100 01/31/2016   Hypertension    Leg cramps 11/24/2020   Past Surgical History:  Procedure Laterality Date   LEG SURGERY Right 1975   Patient Active Problem List   Diagnosis Date Noted   Statin intolerance 02/05/2023   Chronic midline low back pain with left-sided sciatica 02/05/2023   Allergic reaction to plant, excluding food 02/05/2023   Bee sting allergy 01/23/2022   AAA (abdominal aortic aneurysm) without rupture (HCC) 09/19/2021   Shortness of breath 08/13/2021   Seasonal allergies 01/09/2021   Seborrheic dermatitis 01/09/2021   History of colon polyps 01/09/2021   Aortic atherosclerosis (HCC)    GERD (gastroesophageal reflux disease) 11/24/2020   Arthritis 11/24/2020   Leg cramps 11/24/2020   Bilateral carpal tunnel syndrome 11/24/2020   Chronic left shoulder pain 11/24/2020   Hyperlipidemia LDL goal <100 01/31/2016   REFERRING PROVIDER: Gabriel Earing, FNP   REFERRING DIAG: Chronic left shoulder pain   THERAPY DIAG:  Chronic left shoulder pain  Stiffness of left shoulder, not elsewhere classified  Rationale for Evaluation and Treatment:  Rehabilitation  ONSET DATE: 15 years ago  SUBJECTIVE:                                                                                                                                                                                      SUBJECTIVE STATEMENT: Can do more without excruciating pain. Always has a mild discomfort. Can now sleep on his L side for about 30 minutes before having to change position. Hand dominance: Right  PERTINENT HISTORY: Abdominal aortic aneurysm, carpal tunnel, arthritis, carpal tunnel, and hypertension   PAIN:  Are you having pain? Yes: NPRS scale: 2/10 Pain location: left shoulder Pain description: toothache Aggravating factors: movement,  sleeping on his left side (this is his preferred position) Relieving factors: medication   PRECAUTIONS: Fall  WEIGHT BEARING RESTRICTIONS: No  FALLS:  Has patient fallen in last 6 months? Yes. Number of falls 1  PLOF: Independent  PATIENT GOALS: reduced pain, improved mobility, and improved sleep (3 hours prior to being awakened by his shoulder pain)   NEXT MD VISIT: 08/11/23  OBJECTIVE:   PATIENT SURVEYS:  FOTO 64  POSTURE: Forward head and rounded shoulders  UPPER EXTREMITY ROM:   Active ROM Right eval Left eval Left 03/31/23  Shoulder flexion 142 119; "painful if I go further"    Shoulder extension     Shoulder abduction 155 117; "painful if I go further"  94; painful but could push more  Shoulder adduction     Shoulder internal rotation To T12; discomfort  To L2    Shoulder external rotation To T4 To spine of scapula; crepitus and some pain   Elbow flexion     Elbow extension     Wrist flexion     Wrist extension     Wrist ulnar deviation     Wrist radial deviation     Wrist pronation     Wrist supination     (Blank rows = not tested)  UPPER EXTREMITY MMT:  MMT Right eval Left eval  Shoulder flexion 4/5 4/5; painful  Shoulder extension    Shoulder abduction 4+/5 4+/5; painful    Shoulder adduction    Shoulder internal rotation 4+/5 4/5; discomfort  Shoulder external rotation 4+/5 4/5; discomfort  Middle trapezius    Lower trapezius 5/5 5/5  Elbow flexion 4+/5 4+/5  Elbow extension    Wrist flexion    Wrist extension    Wrist ulnar deviation    Wrist radial deviation    Wrist pronation    Wrist supination    Grip strength (lbs)    (Blank rows = not tested)  TODAY'S TREATMENT:                                                                                                                                         DATE: 04/14/23 EXERCISE LOG  Exercise Repetitions and Resistance Comments  UBE 60 RPM x10 min   Pulleys 5 min   Wall wash  Xfatigue with CW and CCW   Resisted IR Blue t-band x30 reps   Resisted ER  Red t-band x 20 reps   Resisted protraction Red theraband x20 reps   Resisted extension Blue theraband x30 reps Increased pain today  Horizontal abduction Red theraband x20 reps Grinding of L shoulder  Resisted row  Blue theraband x30 reps    Blank cell = exercise not performed today   Modalities  Date: 04/14/23 Unattended Estim: Shoulder, Pre-Mod, 10 mins, Pain Hot Pack: Shoulder, 10 mins, Pain  PATIENT EDUCATION: Education details: POC, healing, prognosis, anatomy, benefits of exercise, and  goals for therapy Person educated: Patient Education method: Explanation Education comprehension: verbalized understanding  HOME EXERCISE PROGRAM:  ASSESSMENT:  CLINICAL IMPRESSION: Patient presented in clinic with reports of mild L shoulder pain. Patient has noted some improvement regarding sleep and ADL tolerance and pain. Patient able to tolerate resistance strengthening even with mild discomfort of ER and protraction. Greater resistance provided for most of resisted therex training. Patient indicated greater grinding sensation felt in L shoulder with horizontal abduction at 90 deg shoulder flexion. Normal modalities response noted following removal of the  modalities.  OBJECTIVE IMPAIRMENTS: decreased activity tolerance, decreased mobility, decreased ROM, decreased strength, hypomobility, impaired UE functional use, postural dysfunction, and pain.   ACTIVITY LIMITATIONS: lifting, sleeping, and reach over head  PARTICIPATION LIMITATIONS: community activity and yard work  PERSONAL FACTORS: Time since onset of injury/illness/exacerbation and 3+ comorbidities: Abdominal aortic aneurysm, carpal tunnel, arthritis, carpal tunnel, and hypertension   are also affecting patient's functional outcome.   REHAB POTENTIAL: Good  CLINICAL DECISION MAKING: Evolving/moderate complexity  EVALUATION COMPLEXITY: Moderate  GOALS: Goals reviewed with patient? Yes  SHORT TERM GOALS: Target date: 03/05/23  Patient will be independent with his initial HEP.  Baseline: Goal status: MET  2.  Patient will be able to complete his daily activities without his familiar pain exceeding 5/10.  Baseline: 5/23: depends on the day or activity Goal status: MET  3.  Patient will be able to demonstrate at least 120 degrees of left shoulder flexion for improved function reaching overhead.  Baseline: 5/23: 130 degrees Goal status: MET  4.  Patient will be able to demonstrate at least 125 degrees of left shoulder abduction for improved function with overhead activity.  Baseline: 5/23: 122 degrees Goal status: MET  LONG TERM GOALS: Target date: 03/26/23  Patient will be independent with his advanced HEP.  Baseline:  Goal status: MET  2.  Patient will be able to complete his daily activities without his familiar pain exceeding 3/10.  Baseline: 7/1: Depends on the day Goal status: IN PROGRESS  3.  Patient will be able to demonstrate at least 130 degrees of left shoulder flexion for improved function reaching overhead.  Baseline: 5/23: 130 degrees Goal status: MET  4.  Patient will be able to demonstrate at least 135 degrees of left shoulder abduction for improved  function with overhead activity.  Baseline: 7/1: 135 degrees Goal status: MET  5.  Patient will report being able to sleep throughout the night without being awakened by his familiar left shoulder pain.  Baseline:  Goal status: PARTIALLY MET  PLAN:  PT FREQUENCY: 2x/week  PT DURATION: 6 weeks  PLANNED INTERVENTIONS: Therapeutic exercises, Therapeutic activity, Neuromuscular re-education, Patient/Family education, Self Care, Joint mobilization, Electrical stimulation, Spinal mobilization, Cryotherapy, Moist heat, Vasopneumatic device, Manual therapy, and Re-evaluation  PLAN FOR NEXT SESSION: Continue stabilization and strengthening per POC.  Marvell Fuller, PTA 04/14/2023, 8:59 AM

## 2023-04-21 ENCOUNTER — Ambulatory Visit: Payer: Medicare HMO

## 2023-04-21 DIAGNOSIS — M25612 Stiffness of left shoulder, not elsewhere classified: Secondary | ICD-10-CM

## 2023-04-21 DIAGNOSIS — G8929 Other chronic pain: Secondary | ICD-10-CM | POA: Diagnosis not present

## 2023-04-21 DIAGNOSIS — M25512 Pain in left shoulder: Secondary | ICD-10-CM | POA: Diagnosis not present

## 2023-04-21 NOTE — Therapy (Addendum)
OUTPATIENT PHYSICAL THERAPY SHOULDER TREATMENT   Patient Name: James Davenport MRN: [id number] DOB:July 07, 1956, 67 y.o., male Today's Date: 04/21/2023  END OF SESSION:  PT End of Session - 04/21/23 0810     Visit Number 12    Number of Visits 12    Date for PT Re-Evaluation 05/02/23    PT Start Time 0800    PT Stop Time 0855    PT Time Calculation (min) 55 min    Activity Tolerance Patient tolerated treatment well    Behavior During Therapy [facility] for tasks assessed/performed            Past Medical History:  Diagnosis Date   AAA (abdominal aortic aneurysm) without rupture (HCC) 09/19/2021   Aortic atherosclerosis (HCC)    Arthritis 11/24/2020   Bilateral carpal tunnel syndrome 11/24/2020   Chronic left shoulder pain 11/24/2020   GERD (gastroesophageal reflux disease) 11/24/2020   Hyperlipidemia LDL goal <100 01/31/2016   Hypertension    Leg cramps 11/24/2020   Past Surgical History:  Procedure Laterality Date   LEG SURGERY Right 1975   Patient Active Problem List   Diagnosis Date Noted   Statin intolerance 02/05/2023   Chronic midline low back pain with left-sided sciatica 02/05/2023   Allergic reaction to plant, excluding food 02/05/2023   Bee sting allergy 01/23/2022   AAA (abdominal aortic aneurysm) without rupture (HCC) 09/19/2021   Shortness of breath 08/13/2021   Seasonal allergies 01/09/2021   Seborrheic dermatitis 01/09/2021   History of colon polyps 01/09/2021   Aortic atherosclerosis (HCC)    GERD (gastroesophageal reflux disease) 11/24/2020   Arthritis 11/24/2020   Leg cramps 11/24/2020   Bilateral carpal tunnel syndrome 11/24/2020   Chronic left shoulder pain 11/24/2020   Hyperlipidemia LDL goal <100 01/31/2016   REFERRING PROVIDER: Gabriel Earing, FNP   REFERRING DIAG: Chronic left shoulder pain   THERAPY DIAG:  Chronic left shoulder pain  Stiffness of left shoulder, not elsewhere classified  Rationale for Evaluation and Treatment:  Rehabilitation  ONSET DATE: 15 years ago  SUBJECTIVE:                                                                                                                                                                                      SUBJECTIVE STATEMENT: Pt reports 2/10 left shoulder pain today.  Hand dominance: Right  PERTINENT HISTORY: Abdominal aortic aneurysm, carpal tunnel, arthritis, carpal tunnel, and hypertension   PAIN:  Are you having pain? Yes: NPRS scale: 2/10 Pain location: left shoulder Pain description: toothache Aggravating factors: movement, sleeping on his left side (this is his preferred position) Relieving factors: medication   PRECAUTIONS: Fall  WEIGHT  BEARING RESTRICTIONS: No  FALLS:  Has patient fallen in last 6 months? Yes. Number of falls 1  PLOF: Independent  PATIENT GOALS: reduced pain, improved mobility, and improved sleep (3 hours prior to being awakened by his shoulder pain)   NEXT MD VISIT: 08/11/23  OBJECTIVE:   PATIENT SURVEYS:  FOTO 64  POSTURE: Forward head and rounded shoulders  UPPER EXTREMITY ROM:   Active ROM Right eval Left eval Left 03/31/23  Shoulder flexion 142 119; "painful if I go further"    Shoulder extension     Shoulder abduction 155 117; "painful if I go further"  94; painful but could push more  Shoulder adduction     Shoulder internal rotation To T12; discomfort  To L2    Shoulder external rotation To T4 To spine of scapula; crepitus and some pain   Elbow flexion     Elbow extension     Wrist flexion     Wrist extension     Wrist ulnar deviation     Wrist radial deviation     Wrist pronation     Wrist supination     (Blank rows = not tested)  UPPER EXTREMITY MMT:  MMT Right eval Left eval  Shoulder flexion 4/5 4/5; painful  Shoulder extension    Shoulder abduction 4+/5 4+/5; painful   Shoulder adduction    Shoulder internal rotation 4+/5 4/5; discomfort  Shoulder external rotation 4+/5 4/5;  discomfort  Middle trapezius    Lower trapezius 5/5 5/5  Elbow flexion 4+/5 4+/5  Elbow extension    Wrist flexion    Wrist extension    Wrist ulnar deviation    Wrist radial deviation    Wrist pronation    Wrist supination    Grip strength (lbs)    (Blank rows = not tested)  TODAY'S TREATMENT:                                                                                                                                         DATE: 04/21/23 EXERCISE LOG  Exercise Repetitions and Resistance Comments  UBE 60 RPM x10 min   Pulleys 6 min   Wall wash  Xfatigue with CW and CCW   Resisted IR Blue t-band x30 reps   Resisted ER  Blue t-band x 20 reps   Resisted protraction Blue theraband x20 reps   Resisted extension Blue theraband x30 reps Increased pain today  Horizontal abduction Red theraband x20 reps Grinding of L shoulder  Resisted row  Blue theraband x30 reps    Blank cell = exercise not performed today   Modalities  Date: 04/21/23 Unattended Estim: Shoulder, Pre-Mod, 10 mins, Pain Hot Pack: Shoulder, 10 mins, Pain  PATIENT EDUCATION: Education details: POC, healing, prognosis, anatomy, benefits of exercise, and goals for therapy Person educated: Patient Education method: Explanation Education comprehension: verbalized understanding  HOME EXERCISE PROGRAM:  ASSESSMENT:  CLINICAL IMPRESSION: Pt arrives for today's treatment session reporting 2/10 left shoulder pain.  Pt able to increase FOTO score to 72 today, which is better than his predicted score.  Pt has made good progress towards all of his goals at this time.  Pt continues to struggle with sleeping through the night due to shoulder pain as well as other issues.  Pt reports being able to perform ADLs with pain averaging 4/10 in left shoulder.  Normal responses to estim and vaso noted upon removal.  Pt encouraged to call the facility with any questions or concerns.  Pt ready for discharge at this time.   PHYSICAL  THERAPY DISCHARGE SUMMARY  Visits from Start of Care: 12  Current functional level related to goals / functional outcomes: Patient was able to meet most of his goals for skilled physical therapy and felt comfortable being discharged at this time.    Remaining deficits: Pain    Education / Equipment: HEP    Patient agrees to discharge. Patient goals were partially met. Patient is being discharged due to being pleased with the current functional level.  Candi Leash, PT, DPT    OBJECTIVE IMPAIRMENTS: decreased activity tolerance, decreased mobility, decreased ROM, decreased strength, hypomobility, impaired UE functional use, postural dysfunction, and pain.   ACTIVITY LIMITATIONS: lifting, sleeping, and reach over head  PARTICIPATION LIMITATIONS: community activity and yard work  PERSONAL FACTORS: Time since onset of injury/illness/exacerbation and 3+ comorbidities: Abdominal aortic aneurysm, carpal tunnel, arthritis, carpal tunnel, and hypertension   are also affecting patient's functional outcome.   REHAB POTENTIAL: Good  CLINICAL DECISION MAKING: Evolving/moderate complexity  EVALUATION COMPLEXITY: Moderate  GOALS: Goals reviewed with patient? Yes  SHORT TERM GOALS: Target date: 03/05/23  Patient will be independent with his initial HEP.  Baseline: Goal status: MET  2.  Patient will be able to complete his daily activities without his familiar pain exceeding 5/10.  Baseline: 5/23: depends on the day or activity Goal status: MET  3.  Patient will be able to demonstrate at least 120 degrees of left shoulder flexion for improved function reaching overhead.  Baseline: 5/23: 130 degrees Goal status: MET  4.  Patient will be able to demonstrate at least 125 degrees of left shoulder abduction for improved function with overhead activity.  Baseline: 5/23: 122 degrees Goal status: MET  LONG TERM GOALS: Target date: 03/26/23  Patient will be independent with his advanced  HEP.  Baseline:  Goal status: MET  2.  Patient will be able to complete his daily activities without his familiar pain exceeding 3/10.  Baseline: 7/1: Depends on the day Goal status: IN PROGRESS  3.  Patient will be able to demonstrate at least 130 degrees of left shoulder flexion for improved function reaching overhead.  Baseline: 5/23: 130 degrees Goal status: MET  4.  Patient will be able to demonstrate at least 135 degrees of left shoulder abduction for improved function with overhead activity.  Baseline: 7/1: 135 degrees Goal status: MET  5.  Patient will report being able to sleep throughout the night without being awakened by his familiar left shoulder pain.  Baseline:  Goal status: PARTIALLY MET  PLAN:  PT FREQUENCY: 2x/week  PT DURATION: 6 weeks  PLANNED INTERVENTIONS: Therapeutic exercises, Therapeutic activity, Neuromuscular re-education, Patient/Family education, Self Care, Joint mobilization, Electrical stimulation, Spinal mobilization, Cryotherapy, Moist heat, Vasopneumatic device, Manual therapy, and Re-evaluation  PLAN FOR NEXT SESSION: Continue stabilization and strengthening per POC.  Newman Pies, PTA 04/21/2023, 9:00 AM

## 2023-05-01 DIAGNOSIS — Y939 Activity, unspecified: Secondary | ICD-10-CM | POA: Diagnosis not present

## 2023-05-01 DIAGNOSIS — M25522 Pain in left elbow: Secondary | ICD-10-CM | POA: Diagnosis not present

## 2023-05-01 DIAGNOSIS — S46212A Strain of muscle, fascia and tendon of other parts of biceps, left arm, initial encounter: Secondary | ICD-10-CM | POA: Diagnosis not present

## 2023-05-01 DIAGNOSIS — X58XXXA Exposure to other specified factors, initial encounter: Secondary | ICD-10-CM | POA: Diagnosis not present

## 2023-05-01 DIAGNOSIS — Y929 Unspecified place or not applicable: Secondary | ICD-10-CM | POA: Diagnosis not present

## 2023-05-02 ENCOUNTER — Encounter: Payer: Self-pay | Admitting: Family Medicine

## 2023-05-02 ENCOUNTER — Ambulatory Visit: Payer: Medicare HMO | Admitting: Family Medicine

## 2023-05-02 VITALS — BP 145/96 | HR 75 | Ht 69.0 in | Wt 213.0 lb

## 2023-05-02 DIAGNOSIS — M795 Residual foreign body in soft tissue: Secondary | ICD-10-CM | POA: Diagnosis not present

## 2023-05-02 DIAGNOSIS — I10 Essential (primary) hypertension: Secondary | ICD-10-CM | POA: Diagnosis not present

## 2023-05-02 MED ORDER — LOSARTAN POTASSIUM 25 MG PO TABS
25.0000 mg | ORAL_TABLET | Freq: Every day | ORAL | 3 refills | Status: DC
Start: 2023-05-02 — End: 2023-07-25

## 2023-05-02 MED ORDER — LOSARTAN POTASSIUM 25 MG PO TABS
25.0000 mg | ORAL_TABLET | Freq: Every day | ORAL | 0 refills | Status: DC
Start: 2023-05-02 — End: 2023-05-02

## 2023-05-02 NOTE — Progress Notes (Signed)
   Acute Office Visit  Subjective:     [name], male    DOB: Oct 20, 1955, 67 y.o.   MRN: [id number]  Chief Complaint  Patient presents with   Medical Management of Chronic Issues   Hypertension    Hypertension   Patient is in today for BP check.   He has been checking his BP at home and it has been elevated. Up to 200/100s at home. He has had some mildly elevated BPs in the past in the office, but has not been on medication before. He brought his home monitor with him today and it significantly more elevated than our office readings. His office readings are elevated today as well however. Denies chest pain, shortness of breath, edema, orthopnea, focal weakness.  ROS As per HPI.      Objective:    BP (!) 145/96 (BP Location: Left Arm, Patient Position: Sitting, Cuff Size: Large) Comment: [facility] BP Machine  Pulse 75   Ht 5\' 9"  (1.753 m)   Wt 213 lb (96.6 kg)   SpO2 95%   BMI 31.45 kg/m  BP Readings from Last 3 Encounters:  05/02/23 (!) 145/96  02/05/23 126/76  11/04/22 130/74      Physical Exam Vitals and nursing note reviewed.  Constitutional:      General: He is not in acute distress.    Appearance: He is obese. He is not ill-appearing, toxic-appearing or diaphoretic.  Cardiovascular:     Rate and Rhythm: Normal rate and regular rhythm.     Heart sounds: Normal heart sounds. No murmur heard. Pulmonary:     Effort: Pulmonary effort is normal. No respiratory distress.     Breath sounds: Normal breath sounds.  Musculoskeletal:     Cervical back: Neck supple. No rigidity.     Right lower leg: No edema.     Left lower leg: No edema.  Skin:    General: Skin is warm and dry.  Neurological:     General: No focal deficit present.     Mental Status: He is alert and oriented to person, place, and time.  Psychiatric:        Mood and Affect: Mood normal.        Behavior: Behavior normal.     No results found for any visits on 05/02/23.       Assessment & Plan:   James "[name]" was seen today for medical management of chronic issues and hypertension.  Diagnoses and all orders for this visit:  Primary hypertension BP not at goal. Start losartan as below. Return in 2 weeks for BP check and BMP. Discussed need for new home monitor. Monitor BP at home and bring log and machine to visit.  -     losartan (COZAAR) 25 MG tablet; Take 1 tablet (25 mg total) by mouth daily.   Return in about 2 weeks (around 05/16/2023) for BP check and BMP.  The patient indicates understanding of these issues and agrees with the plan.  Gabriel Earing, FNP

## 2023-05-09 ENCOUNTER — Other Ambulatory Visit: Payer: Self-pay | Admitting: Family Medicine

## 2023-05-09 DIAGNOSIS — E785 Hyperlipidemia, unspecified: Secondary | ICD-10-CM

## 2023-05-16 ENCOUNTER — Encounter: Payer: Self-pay | Admitting: Family Medicine

## 2023-05-16 ENCOUNTER — Ambulatory Visit: Payer: Medicare HMO | Admitting: Family Medicine

## 2023-05-16 VITALS — BP 127/84 | HR 73 | Temp 98.3°F | Ht 69.0 in | Wt 215.1 lb

## 2023-05-16 DIAGNOSIS — I10 Essential (primary) hypertension: Secondary | ICD-10-CM | POA: Diagnosis not present

## 2023-05-16 LAB — BMP8+EGFR
BUN: 17 mg/dL (ref 8–27)
CO2: 18 mmol/L — ABNORMAL LOW (ref 20–29)
Calcium: 9.3 mg/dL (ref 8.6–10.2)
Chloride: 105 mmol/L (ref 96–106)
Glucose: 92 mg/dL (ref 70–99)
Potassium: 4.4 mmol/L (ref 3.5–5.2)
Sodium: 140 mmol/L (ref 134–144)

## 2023-05-16 NOTE — Progress Notes (Signed)
   Established Patient Office Visit  Subjective   [name], male    DOB: [birth year]  Age: 67 y.o. MRN: [id number]  Chief Complaint  Patient presents with   Hypertension    HPI  HTN Complaint with meds - Yes Current Medications - added losartan at last visit.  Checking BP at home ranging-  trending down, 136/82 yesterday Pertinent ROS:  Headache - No Fatigue - No Visual Disturbances - No Chest pain - No Dyspnea - No Palpitations - No LE edema - No     Past Medical History:  Diagnosis Date   AAA (abdominal aortic aneurysm) without rupture (HCC) 09/19/2021   Aortic atherosclerosis (HCC)    Arthritis 11/24/2020   Bilateral carpal tunnel syndrome 11/24/2020   Chronic left shoulder pain 11/24/2020   GERD (gastroesophageal reflux disease) 11/24/2020   Hyperlipidemia LDL goal <100 01/31/2016   Hypertension    Leg cramps 11/24/2020      ROS As per HPI.    Objective:     BP 127/84   Pulse 73   Temp 98.3 F (36.8 C) (Temporal)   Ht 5\' 9"  (1.753 m)   Wt 215 lb 2 oz (97.6 kg)   SpO2 95%   BMI 31.77 kg/m    Physical Exam Vitals and nursing note reviewed.  Constitutional:      General: He is not in acute distress.    Appearance: He is not ill-appearing, toxic-appearing or diaphoretic.  Cardiovascular:     Rate and Rhythm: Normal rate and regular rhythm.     Heart sounds: Normal heart sounds. No murmur heard. Pulmonary:     Effort: Pulmonary effort is normal. No respiratory distress.     Breath sounds: Normal breath sounds. No wheezing.  Musculoskeletal:     Right lower leg: No edema.     Left lower leg: No edema.  Skin:    General: Skin is warm and dry.  Neurological:     General: No focal deficit present.     Mental Status: He is alert.  Psychiatric:        Mood and Affect: Mood normal.        Behavior: Behavior normal.      No results found for any visits on 05/16/23.    The 10-year ASCVD risk score (Arnett DK, et al.,  2019) is: 16.2%    Assessment & Plan:   James Davenport "James Davenport" was seen today for hypertension.  Diagnoses and all orders for this visit:  Primary hypertension BP at goal today. Continue losartan 25 mg. Monitor BP home. He will notify me if BP is elevated at home. Discussed will increase losartan to 50 mg at that point. Will recheck BMP today after starting ARB.  -     BMP8+EGFR  Keep scheduled follow up appt.   The patient indicates understanding of these issues and agrees with the plan.   Gabriel Earing, FNP

## 2023-05-22 DIAGNOSIS — M25522 Pain in left elbow: Secondary | ICD-10-CM | POA: Diagnosis not present

## 2023-05-22 DIAGNOSIS — S46212A Strain of muscle, fascia and tendon of other parts of biceps, left arm, initial encounter: Secondary | ICD-10-CM | POA: Diagnosis not present

## 2023-05-23 ENCOUNTER — Encounter: Payer: Self-pay | Admitting: Family Medicine

## 2023-05-29 DIAGNOSIS — R202 Paresthesia of skin: Secondary | ICD-10-CM | POA: Diagnosis not present

## 2023-06-07 ENCOUNTER — Other Ambulatory Visit: Payer: Self-pay | Admitting: Family Medicine

## 2023-06-07 DIAGNOSIS — M25522 Pain in left elbow: Secondary | ICD-10-CM

## 2023-06-07 DIAGNOSIS — G8929 Other chronic pain: Secondary | ICD-10-CM

## 2023-06-07 DIAGNOSIS — Z91030 Bee allergy status: Secondary | ICD-10-CM

## 2023-07-07 DIAGNOSIS — H52223 Regular astigmatism, bilateral: Secondary | ICD-10-CM | POA: Diagnosis not present

## 2023-07-07 DIAGNOSIS — H2513 Age-related nuclear cataract, bilateral: Secondary | ICD-10-CM | POA: Diagnosis not present

## 2023-07-07 DIAGNOSIS — H524 Presbyopia: Secondary | ICD-10-CM | POA: Diagnosis not present

## 2023-07-07 DIAGNOSIS — H43811 Vitreous degeneration, right eye: Secondary | ICD-10-CM | POA: Diagnosis not present

## 2023-07-07 DIAGNOSIS — H5213 Myopia, bilateral: Secondary | ICD-10-CM | POA: Diagnosis not present

## 2023-07-24 ENCOUNTER — Encounter: Payer: Self-pay | Admitting: Family Medicine

## 2023-07-25 ENCOUNTER — Encounter: Payer: Self-pay | Admitting: Family Medicine

## 2023-07-25 ENCOUNTER — Ambulatory Visit (INDEPENDENT_AMBULATORY_CARE_PROVIDER_SITE_OTHER): Payer: Medicare HMO

## 2023-07-25 ENCOUNTER — Telehealth: Payer: Self-pay | Admitting: Family Medicine

## 2023-07-25 ENCOUNTER — Ambulatory Visit: Payer: Medicare HMO | Admitting: Family Medicine

## 2023-07-25 VITALS — BP 142/83 | HR 81 | Temp 98.2°F | Ht 69.0 in | Wt 212.4 lb

## 2023-07-25 DIAGNOSIS — M25562 Pain in left knee: Secondary | ICD-10-CM | POA: Diagnosis not present

## 2023-07-25 DIAGNOSIS — M79605 Pain in left leg: Secondary | ICD-10-CM

## 2023-07-25 DIAGNOSIS — M5416 Radiculopathy, lumbar region: Secondary | ICD-10-CM

## 2023-07-25 DIAGNOSIS — I10 Essential (primary) hypertension: Secondary | ICD-10-CM

## 2023-07-25 DIAGNOSIS — M79652 Pain in left thigh: Secondary | ICD-10-CM | POA: Diagnosis not present

## 2023-07-25 MED ORDER — PREDNISONE 20 MG PO TABS
40.0000 mg | ORAL_TABLET | Freq: Every day | ORAL | 0 refills | Status: AC
Start: 2023-07-25 — End: 2023-07-30

## 2023-07-25 MED ORDER — HYDROCODONE-ACETAMINOPHEN 5-325 MG PO TABS: 1.0000 | ORAL_TABLET | Freq: Four times a day (QID) | ORAL | 0 refills | Status: AC | PRN

## 2023-07-25 MED ORDER — GABAPENTIN 300 MG PO CAPS: 600.0000 mg | ORAL_CAPSULE | Freq: Two times a day (BID) | ORAL | 3 refills | Status: DC

## 2023-07-25 MED ORDER — METHYLPREDNISOLONE ACETATE 80 MG/ML IJ SUSP
80.0000 mg | Freq: Once | INTRAMUSCULAR | Status: AC
Start: 2023-07-25 — End: 2023-07-25
  Administered 2023-07-25: 80 mg via INTRAMUSCULAR

## 2023-07-25 MED ORDER — VALSARTAN 80 MG PO TABS
80.0000 mg | ORAL_TABLET | Freq: Every day | ORAL | 3 refills | Status: DC
Start: 2023-07-25 — End: 2023-08-27

## 2023-07-25 MED ORDER — HYDROCODONE-ACETAMINOPHEN 5-325 MG PO TABS: 1.0000 | ORAL_TABLET | Freq: Four times a day (QID) | ORAL | 0 refills | Status: DC | PRN

## 2023-07-25 MED ORDER — VALSARTAN 80 MG PO TABS
80.0000 mg | ORAL_TABLET | Freq: Every day | ORAL | 0 refills | Status: DC
Start: 2023-07-25 — End: 2023-07-25

## 2023-07-25 NOTE — Patient Instructions (Signed)
Increase gabapentin to 2 capsules at bedtime for 2 nights, then increase to 2 capsules during the day and 2 capsules at bedtime.

## 2023-07-25 NOTE — Telephone Encounter (Signed)
Patient aware and verbalized understanding. °

## 2023-07-25 NOTE — Progress Notes (Unsigned)
   Acute Office Visit  Subjective:     [name], male    DOB: May 06, 1956, 67 y.o.   MRN: [id number]  Chief Complaint  Patient presents with   Leg Pain   Hypertension    HPI Patient is in today for leg pain. He hyperextended his left knee about 4 weeks ago when getting out of a lifted truck. Initially he had a lot of pain in his knee but is now having a lot of pain in the back of his left upper leg extending down to the back of his knee. Leg feels tight and tender to the touch. Pain also radates down lower leg and to bottom on his left foot. Some mild tingling. Constant achy nagging pain with intermittent sharp shooting pain. Pain is aggravating by twisting, walking downhill, bending. Lying down. No fever, changes in bowel or bladder control. He is walking with a limp. He has been able to sleep much at all due to the pain. He has been taking mobic, gabapentin, and tylenol with mild relief. Pain is 8/10 most of the time.   When he increased his losartan dosage to 50 mg daily he started having joint pain in multiple joints. He cut back his dosage and symptoms have been improving daily. Daughter has    ROS      Objective:    BP (!) 142/83   Pulse 81   Temp 98.2 F (36.8 C) (Temporal)   Ht 5\' 9"  (1.753 m)   Wt 212 lb 6 oz (96.3 kg)   SpO2 95%   BMI 31.36 kg/m  {Vitals History (Optional):23777}  Physical Exam  No results found for any visits on 07/25/23.      Assessment & Plan:   Problem List Items Addressed This Visit   None   No orders of the defined types were placed in this encounter.   No follow-ups on file.  Gabriel Earing, FNP

## 2023-07-25 NOTE — Telephone Encounter (Signed)
Sent to [facility] in [facility]

## 2023-07-31 ENCOUNTER — Ambulatory Visit: Payer: Medicare HMO | Attending: Family Medicine

## 2023-07-31 ENCOUNTER — Other Ambulatory Visit: Payer: Self-pay

## 2023-07-31 DIAGNOSIS — M79605 Pain in left leg: Secondary | ICD-10-CM | POA: Diagnosis not present

## 2023-07-31 DIAGNOSIS — M79652 Pain in left thigh: Secondary | ICD-10-CM | POA: Insufficient documentation

## 2023-07-31 DIAGNOSIS — M5416 Radiculopathy, lumbar region: Secondary | ICD-10-CM | POA: Insufficient documentation

## 2023-07-31 DIAGNOSIS — M25562 Pain in left knee: Secondary | ICD-10-CM | POA: Diagnosis not present

## 2023-07-31 DIAGNOSIS — M25552 Pain in left hip: Secondary | ICD-10-CM | POA: Insufficient documentation

## 2023-07-31 NOTE — Therapy (Signed)
OUTPATIENT PHYSICAL THERAPY THORACOLUMBAR EVALUATION   Patient Name: James Davenport MRN: [id number] DOB:1956/08/26, 67 y.o., male Today's Date: 07/31/2023  END OF SESSION:  PT End of Session - 07/31/23 1601     Visit Number 1    Number of Visits 8    Date for PT Re-Evaluation 10/03/23    PT Start Time 1600    PT Stop Time 1645    PT Time Calculation (min) 45 min    Activity Tolerance Patient tolerated treatment well    Behavior During Therapy [facility] for tasks assessed/performed             Past Medical History:  Diagnosis Date   AAA (abdominal aortic aneurysm) without rupture (HCC) 09/19/2021   Aortic atherosclerosis (HCC)    Arthritis 11/24/2020   Bilateral carpal tunnel syndrome 11/24/2020   Chronic left shoulder pain 11/24/2020   GERD (gastroesophageal reflux disease) 11/24/2020   Hyperlipidemia LDL goal <100 01/31/2016   Hypertension    Leg cramps 11/24/2020   Past Surgical History:  Procedure Laterality Date   LEG SURGERY Right 1975   Patient Active Problem List   Diagnosis Date Noted   Statin intolerance 02/05/2023   Chronic midline low back pain with left-sided sciatica 02/05/2023   Allergic reaction to plant, excluding food 02/05/2023   Bee sting allergy 01/23/2022   AAA (abdominal aortic aneurysm) without rupture (HCC) 09/19/2021   Shortness of breath 08/13/2021   Seasonal allergies 01/09/2021   Seborrheic dermatitis 01/09/2021   History of colon polyps 01/09/2021   Aortic atherosclerosis (HCC)    GERD (gastroesophageal reflux disease) 11/24/2020   Arthritis 11/24/2020   Leg cramps 11/24/2020   Bilateral carpal tunnel syndrome 11/24/2020   Chronic left shoulder pain 11/24/2020   Hyperlipidemia LDL goal <100 01/31/2016   REFERRING PROVIDER: Gabriel Earing, FNP   REFERRING DIAG: Left leg pain, Acute pain of left knee, Lumbar radiculopathy   Rationale for Evaluation and Treatment: Rehabilitation  THERAPY DIAG:  Pain in left thigh  Pain  in left hip  ONSET DATE: 6-7 weeks ago  SUBJECTIVE:                                                                                                                                                                                           SUBJECTIVE STATEMENT: Patient reports that he began having low back and left leg about 6-7 weeks ago. It was initially thought to be a pulled hamstring in his left leg. The injury occurred when he hyperextended left knee when he stepped on a broken piece of concrete. His pain has been slowly getting worse. He had a x-ray,  but it did not show any fractures. He has experienced some bouts of numbness in the back of his left thigh.  PERTINENT HISTORY:  Abdominal aortic aneurysm, carpal tunnel, arthritis, and hypertension   PAIN:  Are you having pain? Yes: NPRS scale: 10/10 Pain location: left hip along hamstring Pain description: sharp and shooting Aggravating factors: certain movements such as twisting, stairs, and sleeping Relieving factors: medication  PRECAUTIONS: None  RED FLAGS: None   WEIGHT BEARING RESTRICTIONS: No  FALLS:  Has patient fallen in last 6 months?  No, but he has to be careful  LIVING ENVIRONMENT: Lives with: lives with their spouse Lives in: House/apartment Stairs: Yes: External: 4 steps; can reach both; reciprocal pattern, but he feels like he "hobbles"  Has following equipment at home: None  OCCUPATION: retired  PLOF: Independent  PATIENT GOALS: reduced pain  NEXT MD VISIT: 08/11/23  OBJECTIVE:  Note: Objective measures were completed at Evaluation unless otherwise noted.  PATIENT SURVEYS:  FOTO 43.01  SCREENING FOR RED FLAGS: Bowel or bladder incontinence: No Spinal tumors: No Cauda equina syndrome: No Compression fracture: No Abdominal aneurysm: Yes:    COGNITION: Overall cognitive status: Within functional limits for tasks assessed     SENSATION: Patient reports intermittent numbness and tingling in  posterior left thigh  POSTURE: forward head  PALPATION: TTP: left hip adductors, hamstrings (medial hamstrings reproduced familiar pain), quadriceps, gluteals (reproduced familiar referred pain, and piriformis (reproduced familiar referred pain)   LUMBAR ROM:   AROM eval  Flexion 70  Extension 20; familiar pain   Right lateral flexion   Left lateral flexion   Right rotation 50% limited; familiar pain  Left rotation 25% limited; midline low back pain   (Blank rows = not tested)  LOWER EXTREMITY ROM:     Active  Right eval Left eval  Hip flexion 104 75  Hip extension    Hip abduction    Hip adduction    Hip internal rotation    Hip external rotation    Knee flexion 134 102  Knee extension    Ankle dorsiflexion    Ankle plantarflexion    Ankle inversion    Ankle eversion     (Blank rows = not tested)  LOWER EXTREMITY MMT:    MMT Right eval Left eval  Hip flexion 4+/5 4-/5  Hip extension    Hip abduction    Hip adduction    Hip internal rotation    Hip external rotation    Knee flexion 4/5 3/5; limited by familiar pain  Knee extension 4+/5 3/5; limited by familiar pain  Ankle dorsiflexion 4-/5 3/5  Ankle plantarflexion    Ankle inversion    Ankle eversion     (Blank rows = not tested)  GAIT: Assistive device utilized: None Level of assistance: Complete Independence Comments: no significant gait deviations observed  TODAY'S TREATMENT:  DATE:                                     07/31/23 EXERCISE LOG  Exercise Repetitions and Resistance Comments  Glute sets  15 reps w/ 5 second hold Seated  Hamstring set  15 reps w/ 5 second hold  Seated    Blank cell = exercise not performed today   PATIENT EDUCATION:  Education details: HEP, POC, healing, anatomy,  Person educated: Patient Education method: Explanation Education  comprehension: verbalized understanding  HOME EXERCISE PROGRAM: [id number]  ASSESSMENT:  CLINICAL IMPRESSION: Patient is a 67 y.o. male who was seen today for physical therapy evaluation and treatment for left hip and thigh pain. He presented with high pain severity and irritability with today's range of motion, manual muscle testing, and palpation assessments reproducing his familiar symptoms. He exhibited reduced left hip and knee flexion compared to the right lower extremity with his familiar symptoms being his primary limiting factor. He was provided a home exercise program which she was able to properly demonstrate. He reported feeling comfortable with these interventions. Recommend that he continue with skilled physical therapy to address his impairments to return to his prior level of function.  OBJECTIVE IMPAIRMENTS: decreased activity tolerance, decreased mobility, difficulty walking, decreased ROM, decreased strength, increased muscle spasms, impaired flexibility, impaired sensation, impaired tone, and pain.   ACTIVITY LIMITATIONS: lifting, standing, squatting, sleeping, stairs, and locomotion level  PARTICIPATION LIMITATIONS: cleaning, community activity, and yard work  PERSONAL FACTORS: Past/current experiences, Time since onset of injury/illness/exacerbation, and 3+ comorbidities: Abdominal aortic aneurysm, carpal tunnel, arthritis, and hypertension   are also affecting patient's functional outcome.   REHAB POTENTIAL: Good  CLINICAL DECISION MAKING: Evolving/moderate complexity  EVALUATION COMPLEXITY: Moderate   GOALS: Goals reviewed with patient? Yes  LONG TERM GOALS: Target date: 08/31/23  Patient will be independent with his HEP. Baseline:  Goal status: INITIAL  2.  Patient will improve his active left knee flexion to at least 120 degrees for improved function descending stairs. Baseline:  Goal status: INITIAL  3.  Patient will be able to complete his daily  activities without his familiar pain exceeding 6/10. Baseline:  Goal status: INITIAL  4.  Patient will improve his left knee flexion and extension strength to at least 4-/5 for improved function with his daily activities. Baseline:  Goal status: INITIAL  5.  Patient will be able to demonstrate at least 95 degrees of left hip flexion for improved function squatting. Baseline:  Goal status: INITIAL  PLAN:  PT FREQUENCY: 2x/week  PT DURATION: 4 weeks  PLANNED INTERVENTIONS: 97164- PT Re-evaluation, 97110-Therapeutic exercises, 97530- Therapeutic activity, [id number]- Neuromuscular re-education, 97535- Self Care, 32440- Manual therapy, 97014- Electrical stimulation (unattended), 10272- Traction (mechanical), Patient/Family education, Balance training, Joint mobilization, Spinal mobilization, Cryotherapy, and Moist heat.  PLAN FOR NEXT SESSION: Nustep, review and updated HEP, isometrics, heel slides, manual therapy, and modalities as needed   Granville Lewis, PT 07/31/2023, 6:22 PM

## 2023-08-04 DIAGNOSIS — R202 Paresthesia of skin: Secondary | ICD-10-CM | POA: Diagnosis not present

## 2023-08-05 ENCOUNTER — Ambulatory Visit: Payer: Medicare HMO

## 2023-08-05 DIAGNOSIS — M25562 Pain in left knee: Secondary | ICD-10-CM | POA: Diagnosis not present

## 2023-08-05 DIAGNOSIS — M25552 Pain in left hip: Secondary | ICD-10-CM | POA: Diagnosis not present

## 2023-08-05 DIAGNOSIS — M5416 Radiculopathy, lumbar region: Secondary | ICD-10-CM | POA: Diagnosis not present

## 2023-08-05 DIAGNOSIS — M79652 Pain in left thigh: Secondary | ICD-10-CM | POA: Diagnosis not present

## 2023-08-05 DIAGNOSIS — M79605 Pain in left leg: Secondary | ICD-10-CM | POA: Diagnosis not present

## 2023-08-05 NOTE — Therapy (Signed)
OUTPATIENT PHYSICAL THERAPY THORACOLUMBAR TREATMENT   Patient Name: James Davenport MRN: [id number] DOB:22-Oct-1955, 67 y.o., male Today's Date: 08/05/2023  END OF SESSION:  PT End of Session - 08/05/23 1602     Visit Number 2    Number of Visits 8    Date for PT Re-Evaluation 10/03/23    PT Start Time 1600    PT Stop Time 1645    PT Time Calculation (min) 45 min    Activity Tolerance Patient tolerated treatment well    Behavior During Therapy [facility] for tasks assessed/performed              Past Medical History:  Diagnosis Date   AAA (abdominal aortic aneurysm) without rupture (HCC) 09/19/2021   Aortic atherosclerosis (HCC)    Arthritis 11/24/2020   Bilateral carpal tunnel syndrome 11/24/2020   Chronic left shoulder pain 11/24/2020   GERD (gastroesophageal reflux disease) 11/24/2020   Hyperlipidemia LDL goal <100 01/31/2016   Hypertension    Leg cramps 11/24/2020   Past Surgical History:  Procedure Laterality Date   LEG SURGERY Right 1975   Patient Active Problem List   Diagnosis Date Noted   Statin intolerance 02/05/2023   Chronic midline low back pain with left-sided sciatica 02/05/2023   Allergic reaction to plant, excluding food 02/05/2023   Bee sting allergy 01/23/2022   AAA (abdominal aortic aneurysm) without rupture (HCC) 09/19/2021   Shortness of breath 08/13/2021   Seasonal allergies 01/09/2021   Seborrheic dermatitis 01/09/2021   History of colon polyps 01/09/2021   Aortic atherosclerosis (HCC)    GERD (gastroesophageal reflux disease) 11/24/2020   Arthritis 11/24/2020   Leg cramps 11/24/2020   Bilateral carpal tunnel syndrome 11/24/2020   Chronic left shoulder pain 11/24/2020   Hyperlipidemia LDL goal <100 01/31/2016   REFERRING PROVIDER: Gabriel Earing, FNP   REFERRING DIAG: Left leg pain, Acute pain of left knee, Lumbar radiculopathy   Rationale for Evaluation and Treatment: Rehabilitation  THERAPY DIAG:  Pain in left thigh  Pain  in left hip  ONSET DATE: 6-7 weeks ago  SUBJECTIVE:                                                                                                                                                                                           SUBJECTIVE STATEMENT: Patient reports that he feels a lot better today. He notes that his HEP is helping and he is able to start cutting back on his pain medication.   PERTINENT HISTORY:  Abdominal aortic aneurysm, carpal tunnel, arthritis, and hypertension   PAIN:  Are you having pain? Yes: NPRS scale: 2/10 Pain location:  left hip along hamstring Pain description: sharp and shooting Aggravating factors: certain movements such as twisting, stairs, and sleeping Relieving factors: medication  PRECAUTIONS: None  RED FLAGS: None   WEIGHT BEARING RESTRICTIONS: No  FALLS:  Has patient fallen in last 6 months?  No, but he has to be careful  LIVING ENVIRONMENT: Lives with: lives with their spouse Lives in: House/apartment Stairs: Yes: External: 4 steps; can reach both; reciprocal pattern, but he feels like he "hobbles"  Has following equipment at home: None  OCCUPATION: retired  PLOF: Independent  PATIENT GOALS: reduced pain  NEXT MD VISIT: 08/11/23  OBJECTIVE:  Note: Objective measures were completed at Evaluation unless otherwise noted.  PATIENT SURVEYS:  FOTO 43.01  SCREENING FOR RED FLAGS: Bowel or bladder incontinence: No Spinal tumors: No Cauda equina syndrome: No Compression fracture: No Abdominal aneurysm: Yes:    COGNITION: Overall cognitive status: Within functional limits for tasks assessed     SENSATION: Patient reports intermittent numbness and tingling in posterior left thigh  POSTURE: forward head  PALPATION: TTP: left hip adductors, hamstrings (medial hamstrings reproduced familiar pain), quadriceps, gluteals (reproduced familiar referred pain, and piriformis (reproduced familiar referred pain)   LUMBAR ROM:    AROM eval  Flexion 70  Extension 20; familiar pain   Right lateral flexion   Left lateral flexion   Right rotation 50% limited; familiar pain  Left rotation 25% limited; midline low back pain   (Blank rows = not tested)  LOWER EXTREMITY ROM:     Active  Right eval Left eval  Hip flexion 104 75  Hip extension    Hip abduction    Hip adduction    Hip internal rotation    Hip external rotation    Knee flexion 134 102  Knee extension    Ankle dorsiflexion    Ankle plantarflexion    Ankle inversion    Ankle eversion     (Blank rows = not tested)  LOWER EXTREMITY MMT:    MMT Right eval Left eval  Hip flexion 4+/5 4-/5  Hip extension    Hip abduction    Hip adduction    Hip internal rotation    Hip external rotation    Knee flexion 4/5 3/5; limited by familiar pain  Knee extension 4+/5 3/5; limited by familiar pain  Ankle dorsiflexion 4-/5 3/5  Ankle plantarflexion    Ankle inversion    Ankle eversion     (Blank rows = not tested)  GAIT: Assistive device utilized: None Level of assistance: Complete Independence Comments: no significant gait deviations observed  TODAY'S TREATMENT:                                                                                                                              DATE:  08/05/23 EXERCISE LOG  Exercise Repetitions and Resistance Comments  Nustep  L3 x 15 minutes    SLR  15 reps each    Supine HS curls  3 minutes  With LE supported on red ball   Supine gluteal sets  3 minutes w/ 5 second hold   Standing hip extension 3 minutes  Alternating LE   Rocker board  5 minutes  BUE support    Blank cell = exercise not performed today                                    07/31/23 EXERCISE LOG  Exercise Repetitions and Resistance Comments  Glute sets  15 reps w/ 5 second hold Seated  Hamstring set  15 reps w/ 5 second hold  Seated    Blank cell = exercise not performed today   PATIENT  EDUCATION:  Education details: HEP, POC, healing, and anatomy Person educated: Patient Education method: Explanation Education comprehension: verbalized understanding  HOME EXERCISE PROGRAM: [id number]  ASSESSMENT:  CLINICAL IMPRESSION: Patient was introduced to multiple new interventions for reduced pain and improved lower extremity mobility. He required minimal cueing with standing hip extension to isolate posterior chain engagement and limit trunk flexion. He experienced no increase in pain or discomfort with any of today's interventions. He reported feeling good upon the conclusion of treatment. He continues to require skilled physical therapy to address his remaining impairments to return to his prior level of function.   OBJECTIVE IMPAIRMENTS: decreased activity tolerance, decreased mobility, difficulty walking, decreased ROM, decreased strength, increased muscle spasms, impaired flexibility, impaired sensation, impaired tone, and pain.   ACTIVITY LIMITATIONS: lifting, standing, squatting, sleeping, stairs, and locomotion level  PARTICIPATION LIMITATIONS: cleaning, community activity, and yard work  PERSONAL FACTORS: Past/current experiences, Time since onset of injury/illness/exacerbation, and 3+ comorbidities: Abdominal aortic aneurysm, carpal tunnel, arthritis, and hypertension   are also affecting patient's functional outcome.   REHAB POTENTIAL: Good  CLINICAL DECISION MAKING: Evolving/moderate complexity  EVALUATION COMPLEXITY: Moderate   GOALS: Goals reviewed with patient? Yes  LONG TERM GOALS: Target date: 08/31/23  Patient will be independent with his HEP. Baseline:  Goal status: INITIAL  2.  Patient will improve his active left knee flexion to at least 120 degrees for improved function descending stairs. Baseline:  Goal status: INITIAL  3.  Patient will be able to complete his daily activities without his familiar pain exceeding 6/10. Baseline:  Goal status:  INITIAL  4.  Patient will improve his left knee flexion and extension strength to at least 4-/5 for improved function with his daily activities. Baseline:  Goal status: INITIAL  5.  Patient will be able to demonstrate at least 95 degrees of left hip flexion for improved function squatting. Baseline:  Goal status: INITIAL  PLAN:  PT FREQUENCY: 2x/week  PT DURATION: 4 weeks  PLANNED INTERVENTIONS: 97164- PT Re-evaluation, 97110-Therapeutic exercises, 97530- Therapeutic activity, [id number]- Neuromuscular re-education, 97535- Self Care, 25366- Manual therapy, 97014- Electrical stimulation (unattended), 44034- Traction (mechanical), Patient/Family education, Balance training, Joint mobilization, Spinal mobilization, Cryotherapy, and Moist heat.  PLAN FOR NEXT SESSION: Nustep, review and updated HEP, isometrics, heel slides, manual therapy, and modalities as needed   Granville Lewis, PT 08/05/2023, 5:10 PM

## 2023-08-07 ENCOUNTER — Ambulatory Visit: Payer: Medicare HMO

## 2023-08-07 DIAGNOSIS — M79652 Pain in left thigh: Secondary | ICD-10-CM | POA: Diagnosis not present

## 2023-08-07 DIAGNOSIS — M25562 Pain in left knee: Secondary | ICD-10-CM | POA: Diagnosis not present

## 2023-08-07 DIAGNOSIS — M25552 Pain in left hip: Secondary | ICD-10-CM

## 2023-08-07 DIAGNOSIS — M5416 Radiculopathy, lumbar region: Secondary | ICD-10-CM | POA: Diagnosis not present

## 2023-08-07 DIAGNOSIS — M79605 Pain in left leg: Secondary | ICD-10-CM | POA: Diagnosis not present

## 2023-08-07 NOTE — Therapy (Signed)
OUTPATIENT PHYSICAL THERAPY THORACOLUMBAR TREATMENT   Patient Name: James Davenport MRN: [id number] DOB:1956/07/05, 67 y.o., male Today's Date: 08/07/2023  END OF SESSION:  PT End of Session - 08/07/23 1548     Visit Number 3    Number of Visits 8    Date for PT Re-Evaluation 10/03/23    PT Start Time 1515    PT Stop Time 1600    PT Time Calculation (min) 45 min    Activity Tolerance Patient tolerated treatment well    Behavior During Therapy [facility] for tasks assessed/performed               Past Medical History:  Diagnosis Date   AAA (abdominal aortic aneurysm) without rupture (HCC) 09/19/2021   Aortic atherosclerosis (HCC)    Arthritis 11/24/2020   Bilateral carpal tunnel syndrome 11/24/2020   Chronic left shoulder pain 11/24/2020   GERD (gastroesophageal reflux disease) 11/24/2020   Hyperlipidemia LDL goal <100 01/31/2016   Hypertension    Leg cramps 11/24/2020   Past Surgical History:  Procedure Laterality Date   LEG SURGERY Right 1975   Patient Active Problem List   Diagnosis Date Noted   Statin intolerance 02/05/2023   Chronic midline low back pain with left-sided sciatica 02/05/2023   Allergic reaction to plant, excluding food 02/05/2023   Bee sting allergy 01/23/2022   AAA (abdominal aortic aneurysm) without rupture (HCC) 09/19/2021   Shortness of breath 08/13/2021   Seasonal allergies 01/09/2021   Seborrheic dermatitis 01/09/2021   History of colon polyps 01/09/2021   Aortic atherosclerosis (HCC)    GERD (gastroesophageal reflux disease) 11/24/2020   Arthritis 11/24/2020   Leg cramps 11/24/2020   Bilateral carpal tunnel syndrome 11/24/2020   Chronic left shoulder pain 11/24/2020   Hyperlipidemia LDL goal <100 01/31/2016   REFERRING PROVIDER: Gabriel Earing, FNP   REFERRING DIAG: Left leg pain, Acute pain of left knee, Lumbar radiculopathy   Rationale for Evaluation and Treatment: Rehabilitation  THERAPY DIAG:  Pain in left  thigh  Pain in left hip  ONSET DATE: 6-7 weeks ago  SUBJECTIVE:                                                                                                                                                                                           SUBJECTIVE STATEMENT: Patient reports that he had bad cramping after his last appointment and could hardly sleep at night. He notes that it is not as bad today, but it was so bad that he almost went to the hospital.   PERTINENT HISTORY:  Abdominal aortic aneurysm, carpal tunnel, arthritis, and hypertension   PAIN:  Are you having pain? Yes: NPRS scale: 2-3/10 Pain location: left hip along hamstring Pain description: sharp and shooting Aggravating factors: certain movements such as twisting, stairs, and sleeping Relieving factors: medication  PRECAUTIONS: None  RED FLAGS: None   WEIGHT BEARING RESTRICTIONS: No  FALLS:  Has patient fallen in last 6 months?  No, but he has to be careful  LIVING ENVIRONMENT: Lives with: lives with their spouse Lives in: House/apartment Stairs: Yes: External: 4 steps; can reach both; reciprocal pattern, but he feels like he "hobbles"  Has following equipment at home: None  OCCUPATION: retired  PLOF: Independent  PATIENT GOALS: reduced pain  NEXT MD VISIT: 08/11/23  OBJECTIVE:  Note: Objective measures were completed at Evaluation unless otherwise noted.  PATIENT SURVEYS:  FOTO 43.01  SCREENING FOR RED FLAGS: Bowel or bladder incontinence: No Spinal tumors: No Cauda equina syndrome: No Compression fracture: No Abdominal aneurysm: Yes:    COGNITION: Overall cognitive status: Within functional limits for tasks assessed     SENSATION: Patient reports intermittent numbness and tingling in posterior left thigh  POSTURE: forward head  PALPATION: TTP: left hip adductors, hamstrings (medial hamstrings reproduced familiar pain), quadriceps, gluteals (reproduced familiar referred pain,  and piriformis (reproduced familiar referred pain)   LUMBAR ROM:   AROM eval  Flexion 70  Extension 20; familiar pain   Right lateral flexion   Left lateral flexion   Right rotation 50% limited; familiar pain  Left rotation 25% limited; midline low back pain   (Blank rows = not tested)  LOWER EXTREMITY ROM:     Active  Right eval Left eval  Hip flexion 104 75  Hip extension    Hip abduction    Hip adduction    Hip internal rotation    Hip external rotation    Knee flexion 134 102  Knee extension    Ankle dorsiflexion    Ankle plantarflexion    Ankle inversion    Ankle eversion     (Blank rows = not tested)  LOWER EXTREMITY MMT:    MMT Right eval Left eval  Hip flexion 4+/5 4-/5  Hip extension    Hip abduction    Hip adduction    Hip internal rotation    Hip external rotation    Knee flexion 4/5 3/5; limited by familiar pain  Knee extension 4+/5 3/5; limited by familiar pain  Ankle dorsiflexion 4-/5 3/5  Ankle plantarflexion    Ankle inversion    Ankle eversion     (Blank rows = not tested)  GAIT: Assistive device utilized: None Level of assistance: Complete Independence Comments: no significant gait deviations observed  TODAY'S TREATMENT:                                                                                                                              DATE:  08/07/23 EXERCISE LOG  Exercise Repetitions and Resistance Comments  LAQ 3 minutes w/ 5 second hold  Alternating LE  Seated hamstring set 3 minutes w/ 5 second hold    Glute sets  3 minutes w/ 5 second hold             Blank cell = exercise not performed today  Modalities: no redness or adverse reaction to today's modalities  Date:  Unattended Estim: bilateral gluteals, IFC @ 80-150 Hz w/ 40% scan, 15 mins, Pain                                   08/05/23 EXERCISE LOG  Exercise Repetitions and Resistance Comments  Nustep  L3 x 15 minutes     SLR  15 reps each    Supine HS curls  3 minutes  With LE supported on red ball   Supine gluteal sets  3 minutes w/ 5 second hold   Standing hip extension 3 minutes  Alternating LE   Rocker board  5 minutes  BUE support    Blank cell = exercise not performed today                                    07/31/23 EXERCISE LOG  Exercise Repetitions and Resistance Comments  Glute sets  15 reps w/ 5 second hold Seated  Hamstring set  15 reps w/ 5 second hold  Seated    Blank cell = exercise not performed today   PATIENT EDUCATION:  Education details: HEP, POC, healing, referred pain, activity modification, and anatomy Person educated: Patient Education method: Explanation Education comprehension: verbalized understanding  HOME EXERCISE PROGRAM: [id number]  ASSESSMENT:  CLINICAL IMPRESSION: Today's interventions focused on isometric interventions due to increased pain after his last appointment. He required moderate cueing with today's interventions for proper exercise performance to facilitate muscular engagement without aggravating his familiar symptoms. He was educated on activity modifications to avoid aggravating his familiar symptoms. He reported understanding these modifications. He reported feeling alright upon the conclusion of treatment. He continues to require skilled physical therapy to address his remaining impairments to return to his prior level of function.    OBJECTIVE IMPAIRMENTS: decreased activity tolerance, decreased mobility, difficulty walking, decreased ROM, decreased strength, increased muscle spasms, impaired flexibility, impaired sensation, impaired tone, and pain.   ACTIVITY LIMITATIONS: lifting, standing, squatting, sleeping, stairs, and locomotion level  PARTICIPATION LIMITATIONS: cleaning, community activity, and yard work  PERSONAL FACTORS: Past/current experiences, Time since onset of injury/illness/exacerbation, and 3+ comorbidities: Abdominal aortic aneurysm,  carpal tunnel, arthritis, and hypertension   are also affecting patient's functional outcome.   REHAB POTENTIAL: Good  CLINICAL DECISION MAKING: Evolving/moderate complexity  EVALUATION COMPLEXITY: Moderate   GOALS: Goals reviewed with patient? Yes  LONG TERM GOALS: Target date: 08/31/23  Patient will be independent with his HEP. Baseline:  Goal status: INITIAL  2.  Patient will improve his active left knee flexion to at least 120 degrees for improved function descending stairs. Baseline:  Goal status: INITIAL  3.  Patient will be able to complete his daily activities without his familiar pain exceeding 6/10. Baseline:  Goal status: INITIAL  4.  Patient will improve his left knee flexion and extension strength to at least 4-/5 for improved function with his daily activities. Baseline:  Goal status: INITIAL  5.  Patient will be able to demonstrate at least 95 degrees of left hip flexion for improved function squatting. Baseline:  Goal status: INITIAL  PLAN:  PT FREQUENCY: 2x/week  PT DURATION: 4 weeks  PLANNED INTERVENTIONS: 97164- PT Re-evaluation, 97110-Therapeutic exercises, 97530- Therapeutic activity, [id number]- Neuromuscular re-education, 97535- Self Care, 10272- Manual therapy, 97014- Electrical stimulation (unattended), 53664- Traction (mechanical), Patient/Family education, Balance training, Joint mobilization, Spinal mobilization, Cryotherapy, and Moist heat.  PLAN FOR NEXT SESSION: Nustep, review and updated HEP, isometrics, heel slides, manual therapy, and modalities as needed   Granville Lewis, PT 08/07/2023, 5:12 PM

## 2023-08-08 ENCOUNTER — Ambulatory Visit: Payer: Medicare HMO | Admitting: Family Medicine

## 2023-08-11 ENCOUNTER — Ambulatory Visit (INDEPENDENT_AMBULATORY_CARE_PROVIDER_SITE_OTHER): Payer: Medicare HMO | Admitting: Family Medicine

## 2023-08-11 ENCOUNTER — Encounter: Payer: Self-pay | Admitting: Family Medicine

## 2023-08-11 VITALS — BP 126/79 | HR 68 | Temp 98.3°F | Ht 69.0 in | Wt 207.0 lb

## 2023-08-11 DIAGNOSIS — E785 Hyperlipidemia, unspecified: Secondary | ICD-10-CM | POA: Diagnosis not present

## 2023-08-11 DIAGNOSIS — M5442 Lumbago with sciatica, left side: Secondary | ICD-10-CM | POA: Diagnosis not present

## 2023-08-11 DIAGNOSIS — I10 Essential (primary) hypertension: Secondary | ICD-10-CM | POA: Insufficient documentation

## 2023-08-11 DIAGNOSIS — G8929 Other chronic pain: Secondary | ICD-10-CM

## 2023-08-11 DIAGNOSIS — J301 Allergic rhinitis due to pollen: Secondary | ICD-10-CM

## 2023-08-11 DIAGNOSIS — I700 Atherosclerosis of aorta: Secondary | ICD-10-CM

## 2023-08-11 DIAGNOSIS — K219 Gastro-esophageal reflux disease without esophagitis: Secondary | ICD-10-CM

## 2023-08-11 DIAGNOSIS — R252 Cramp and spasm: Secondary | ICD-10-CM | POA: Diagnosis not present

## 2023-08-11 DIAGNOSIS — Z789 Other specified health status: Secondary | ICD-10-CM | POA: Diagnosis not present

## 2023-08-11 MED ORDER — CYCLOBENZAPRINE HCL 10 MG PO TABS
ORAL_TABLET | ORAL | 1 refills | Status: DC
Start: 2023-08-11 — End: 2023-09-25

## 2023-08-11 MED ORDER — PREDNISONE 10 MG (21) PO TBPK: ORAL_TABLET | ORAL | 0 refills | Status: DC

## 2023-08-11 NOTE — Progress Notes (Signed)
Established Patient Office Visit  Subjective   [name], male    DOB: 1956/09/30  Age: 67 y.o. MRN: [id number]  Chief Complaint  Patient presents with   Medical Management of Chronic Issues   Hypertension    Hypertension    HTN Complaint with meds - Yes Current Medications - valsartan Pertinent ROS:  Headache - No Fatigue - No Visual Disturbances - No Chest pain - No Dyspnea - No Palpitations - No LE edema - No  2. HLD Compliant with nexlizet. No side effects.   3. GERD Compliant with medications - Yes Current medications - protonix mg BID  Sore throat - No Voice change - No Hemoptysis - No Dysphagia or dyspepsia - No Water brash - No Red Flags (weight loss, hematochezia, melena, weight loss, early satiety, fevers, odynophagia, or persistent vomiting) - No  4. Back pain Increase in symptoms after injury recently. Symptoms have been improving. No red flag symptoms. Has started PT. Taking mobic, gabapentin and flexeril with some relief. Has completed prednisone course. Has taken 3 norco's sent Rx was sent in.   5. Poison ivy Request prednisone to have on hand. Hx of significant reactions to poison ivy/oak in past. 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   Past Medical History:  Diagnosis Date   AAA (abdominal aortic aneurysm) without rupture (HCC) 09/19/2021   Aortic atherosclerosis (HCC)    Arthritis 11/24/2020   Bilateral carpal tunnel syndrome 11/24/2020   Chronic left shoulder pain 11/24/2020   GERD (gastroesophageal reflux disease) 11/24/2020   Hyperlipidemia LDL goal <100 01/31/2016   Hypertension    Leg cramps 11/24/2020      ROS    Objective:     BP 126/79   Pulse 68   Temp 98.3 F (36.8 C) (Temporal)   Ht 5\' 9"  (1.753 m)   Wt 207 lb (93.9 kg)   SpO2 97%   BMI 30.57 kg/m    Physical Exam Vitals and nursing note reviewed.  Constitutional:      General: He  is not in acute distress.    Appearance: He is obese. He is not ill-appearing, toxic-appearing or diaphoretic.  Cardiovascular:     Rate and Rhythm: Normal rate and regular rhythm.     Pulses: Normal pulses.     Heart sounds: Normal heart sounds. No murmur heard. Pulmonary:     Effort: Pulmonary effort is normal. No respiratory distress.     Breath sounds: Normal breath sounds.  Musculoskeletal:     Cervical back: Neck supple. No tenderness.     Right lower leg: No edema.     Left lower leg: No edema.  Lymphadenopathy:     Cervical: No cervical adenopathy.  Skin:    General: Skin is warm and dry.  Neurological:     General: No focal deficit present.     Mental Status: He is alert and oriented to person, place, and time.  Psychiatric:        Mood and Affect: Mood normal.        Behavior: Behavior normal.        Thought Content: Thought content normal.        Judgment: Judgment normal.      No results found for any visits on 08/11/23.    The 10-year ASCVD risk score (Arnett DK, et al., 2019) is: 16%    Assessment & Plan:   James Davenport"  was seen today for medical management of chronic issues and hypertension.  Diagnoses and all orders for this visit:  Primary hypertension Well controlled on current regimen.   Hyperlipidemia LDL goal <100 Statin intolerance Last LDL 79. Continue nexlizet, diet, exercise, weight loss.   Gastroesophageal reflux disease, unspecified whether esophagitis present Well controlled on current regimen.   Aortic atherosclerosis (HCC) On aspirin. Statin intolerant.   Chronic midline low back pain with left-sided sciatica Leg cramps Improving. Continue PT, mobic, gabapentin, flexeril. No red flags.  -     cyclobenzaprine (FLEXERIL) 10 MG tablet; TAKE 1 TABLET EVERY DAY AS NEEDED FOR MUSCLE SPASM(S)  Allergic reaction to plant, excluding food Refill provided to have on hand. Aware of when to seek emergency care.  -     predniSONE  (STERAPRED UNI-PAK 21 TAB) 10 MG (21) TBPK tablet; Use as directed on back of pill pack   Return in about 3 months (around 11/11/2023) for CPE with fasting labs.   The patient indicates understanding of these issues and agrees with the plan.  James Earing, FNP

## 2023-08-12 ENCOUNTER — Ambulatory Visit: Payer: Medicare HMO | Attending: Family Medicine

## 2023-08-12 DIAGNOSIS — M25552 Pain in left hip: Secondary | ICD-10-CM | POA: Diagnosis not present

## 2023-08-12 DIAGNOSIS — M79652 Pain in left thigh: Secondary | ICD-10-CM | POA: Diagnosis not present

## 2023-08-12 NOTE — Therapy (Signed)
OUTPATIENT PHYSICAL THERAPY THORACOLUMBAR TREATMENT   Patient Name: James Davenport MRN: [id number] DOB:[birth year], 67 y.o., male Today's Date: 08/12/2023  END OF SESSION:  PT End of Session - 08/12/23 1607     Visit Number 4    Number of Visits 8    Date for PT Re-Evaluation 10/03/23    PT Start Time 1601    PT Stop Time 1643    PT Time Calculation (min) 42 min    Activity Tolerance Patient tolerated treatment well    Behavior During Therapy [facility] for tasks assessed/performed                Past Medical History:  Diagnosis Date   AAA (abdominal aortic aneurysm) without rupture (HCC) 09/19/2021   Aortic atherosclerosis (HCC)    Arthritis 11/24/2020   Bilateral carpal tunnel syndrome 11/24/2020   Chronic left shoulder pain 11/24/2020   GERD (gastroesophageal reflux disease) 11/24/2020   Hyperlipidemia LDL goal <100 01/31/2016   Hypertension    Leg cramps 11/24/2020   Past Surgical History:  Procedure Laterality Date   LEG SURGERY Right 1975   Patient Active Problem List   Diagnosis Date Noted   Primary hypertension 08/11/2023   Statin intolerance 02/05/2023   Chronic midline low back pain with left-sided sciatica 02/05/2023   Allergic reaction to plant, excluding food 02/05/2023   Bee sting allergy 01/23/2022   AAA (abdominal aortic aneurysm) without rupture (HCC) 09/19/2021   Shortness of breath 08/13/2021   Seasonal allergies 01/09/2021   Seborrheic dermatitis 01/09/2021   History of colon polyps 01/09/2021   Aortic atherosclerosis (HCC)    GERD (gastroesophageal reflux disease) 11/24/2020   Arthritis 11/24/2020   Leg cramps 11/24/2020   Bilateral carpal tunnel syndrome 11/24/2020   Chronic left shoulder pain 11/24/2020   Hyperlipidemia LDL goal <100 01/31/2016   REFERRING PROVIDER: Gabriel Earing, FNP   REFERRING DIAG: Left leg pain, Acute pain of left knee, Lumbar radiculopathy   Rationale for Evaluation and Treatment:  Rehabilitation  THERAPY DIAG:  Pain in left thigh  Pain in left hip  ONSET DATE: 6-7 weeks ago  SUBJECTIVE:                                                                                                                                                                                           SUBJECTIVE STATEMENT: Patient reports that he has been having about 1 cramp per night since his last appointment, but they have not been as severe.   PERTINENT HISTORY:  Abdominal aortic aneurysm, carpal tunnel, arthritis, and hypertension   PAIN:  Are you having pain? Yes: NPRS scale: 2-3/10  Pain location: left hip along hamstring Pain description: sharp and shooting Aggravating factors: certain movements such as twisting, stairs, and sleeping Relieving factors: medication  PRECAUTIONS: None  RED FLAGS: None   WEIGHT BEARING RESTRICTIONS: No  FALLS:  Has patient fallen in last 6 months?  No, but he has to be careful  LIVING ENVIRONMENT: Lives with: lives with their spouse Lives in: House/apartment Stairs: Yes: External: 4 steps; can reach both; reciprocal pattern, but he feels like he "hobbles"  Has following equipment at home: None  OCCUPATION: retired  PLOF: Independent  PATIENT GOALS: reduced pain  NEXT MD VISIT: 08/11/23  OBJECTIVE:  Note: Objective measures were completed at Evaluation unless otherwise noted.  PATIENT SURVEYS:  FOTO 43.01  SCREENING FOR RED FLAGS: Bowel or bladder incontinence: No Spinal tumors: No Cauda equina syndrome: No Compression fracture: No Abdominal aneurysm: Yes:    COGNITION: Overall cognitive status: Within functional limits for tasks assessed     SENSATION: Patient reports intermittent numbness and tingling in posterior left thigh  POSTURE: forward head  PALPATION: TTP: left hip adductors, hamstrings (medial hamstrings reproduced familiar pain), quadriceps, gluteals (reproduced familiar referred pain, and piriformis  (reproduced familiar referred pain)   LUMBAR ROM:   AROM eval  Flexion 70  Extension 20; familiar pain   Right lateral flexion   Left lateral flexion   Right rotation 50% limited; familiar pain  Left rotation 25% limited; midline low back pain   (Blank rows = not tested)  LOWER EXTREMITY ROM:     Active  Right eval Left eval  Hip flexion 104 75  Hip extension    Hip abduction    Hip adduction    Hip internal rotation    Hip external rotation    Knee flexion 134 102  Knee extension    Ankle dorsiflexion    Ankle plantarflexion    Ankle inversion    Ankle eversion     (Blank rows = not tested)  LOWER EXTREMITY MMT:    MMT Right eval Left eval  Hip flexion 4+/5 4-/5  Hip extension    Hip abduction    Hip adduction    Hip internal rotation    Hip external rotation    Knee flexion 4/5 3/5; limited by familiar pain  Knee extension 4+/5 3/5; limited by familiar pain  Ankle dorsiflexion 4-/5 3/5  Ankle plantarflexion    Ankle inversion    Ankle eversion     (Blank rows = not tested)  GAIT: Assistive device utilized: None Level of assistance: Complete Independence Comments: no significant gait deviations observed  TODAY'S TREATMENT:                                                                                                                              DATE:  08/12/23 EXERCISE LOG  Exercise Repetitions and Resistance Comments  Rocker board  6 minutes   Seated hip ADD isometric  3.5 minutes   Seated heel slide  3 minutes  LLE only; reported reduced hamstring pain after this intervention  LAQ 30 reps  LLE only  Eccentric heel tap  6" step x 20 reps  LLE on step; he experienced a cramp in his left hamstring after this intervention       Blank cell = exercise not performed today  Manual Therapy Soft Tissue Mobilization: left hamstring, for reduced pain and tone                                     08/07/23 EXERCISE  LOG  Exercise Repetitions and Resistance Comments  LAQ 3 minutes w/ 5 second hold  Alternating LE  Seated hamstring set 3 minutes w/ 5 second hold    Glute sets  3 minutes w/ 5 second hold             Blank cell = exercise not performed today  Modalities: no redness or adverse reaction to today's modalities  Date:  Unattended Estim: bilateral gluteals, IFC @ 80-150 Hz w/ 40% scan, 15 mins, Pain                                   08/05/23 EXERCISE LOG  Exercise Repetitions and Resistance Comments  Nustep  L3 x 15 minutes    SLR  15 reps each    Supine HS curls  3 minutes  With LE supported on red ball   Supine gluteal sets  3 minutes w/ 5 second hold   Standing hip extension 3 minutes  Alternating LE   Rocker board  5 minutes  BUE support    Blank cell = exercise not performed today   PATIENT EDUCATION:  Education details: HEP, POC, healing, referred pain, activity modification, and anatomy Person educated: Patient Education method: Explanation Education comprehension: verbalized understanding  HOME EXERCISE PROGRAM: [id number]  ASSESSMENT:  CLINICAL IMPRESSION: Patient was introduced to multiple new interventions with moderate difficulty. He required minimal cueing with eccentric heel taps for improved quadriceps control needed for navigating stairs. However, he experienced a moderate increase in his familiar "cramping" in his left hamstring after this intervention was completed. Soft tissue mobilization to his left hamstring was able to significantly reduce his familiar symptoms and increased tone. He reported feeling better upon the conclusion of treatment. He continues to requires skilled physical therapy to address his remaining impairments to return to his prior level of function.    OBJECTIVE IMPAIRMENTS: decreased activity tolerance, decreased mobility, difficulty walking, decreased ROM, decreased strength, increased muscle spasms, impaired flexibility, impaired sensation,  impaired tone, and pain.   ACTIVITY LIMITATIONS: lifting, standing, squatting, sleeping, stairs, and locomotion level  PARTICIPATION LIMITATIONS: cleaning, community activity, and yard work  PERSONAL FACTORS: Past/current experiences, Time since onset of injury/illness/exacerbation, and 3+ comorbidities: Abdominal aortic aneurysm, carpal tunnel, arthritis, and hypertension   are also affecting patient's functional outcome.   REHAB POTENTIAL: Good  CLINICAL DECISION MAKING: Evolving/moderate complexity  EVALUATION COMPLEXITY: Moderate   GOALS: Goals reviewed with patient? Yes  LONG TERM GOALS: Target date: 08/31/23  Patient will be independent with his HEP. Baseline:  Goal status: INITIAL  2.  Patient will improve his active left knee  flexion to at least 120 degrees for improved function descending stairs. Baseline:  Goal status: INITIAL  3.  Patient will be able to complete his daily activities without his familiar pain exceeding 6/10. Baseline:  Goal status: INITIAL  4.  Patient will improve his left knee flexion and extension strength to at least 4-/5 for improved function with his daily activities. Baseline:  Goal status: INITIAL  5.  Patient will be able to demonstrate at least 95 degrees of left hip flexion for improved function squatting. Baseline:  Goal status: INITIAL  PLAN:  PT FREQUENCY: 2x/week  PT DURATION: 4 weeks  PLANNED INTERVENTIONS: 97164- PT Re-evaluation, 97110-Therapeutic exercises, 97530- Therapeutic activity, [id number]- Neuromuscular re-education, 97535- Self Care, 01027- Manual therapy, 97014- Electrical stimulation (unattended), 25366- Traction (mechanical), Patient/Family education, Balance training, Joint mobilization, Spinal mobilization, Cryotherapy, and Moist heat.  PLAN FOR NEXT SESSION: Nustep, review and updated HEP, isometrics, heel slides, manual therapy, and modalities as needed   Granville Lewis, PT 08/12/2023, 5:48 PM

## 2023-08-14 ENCOUNTER — Ambulatory Visit: Payer: Medicare HMO

## 2023-08-14 DIAGNOSIS — M25552 Pain in left hip: Secondary | ICD-10-CM

## 2023-08-14 DIAGNOSIS — M79652 Pain in left thigh: Secondary | ICD-10-CM

## 2023-08-14 NOTE — Therapy (Signed)
OUTPATIENT PHYSICAL THERAPY THORACOLUMBAR TREATMENT   Patient Name: James Davenport MRN: [id number] DOB:Nov 09, 1955, 67 y.o., male Today's Date: 08/14/2023  END OF SESSION:  PT End of Session - 08/14/23 1601     Visit Number 5    Number of Visits 8    Date for PT Re-Evaluation 10/03/23    PT Start Time 1600    PT Stop Time 1641    PT Time Calculation (min) 41 min    Activity Tolerance Patient tolerated treatment well    Behavior During Therapy [facility] for tasks assessed/performed                 Past Medical History:  Diagnosis Date   AAA (abdominal aortic aneurysm) without rupture (HCC) 09/19/2021   Aortic atherosclerosis (HCC)    Arthritis 11/24/2020   Bilateral carpal tunnel syndrome 11/24/2020   Chronic left shoulder pain 11/24/2020   GERD (gastroesophageal reflux disease) 11/24/2020   Hyperlipidemia LDL goal <100 01/31/2016   Hypertension    Leg cramps 11/24/2020   Past Surgical History:  Procedure Laterality Date   LEG SURGERY Right 1975   Patient Active Problem List   Diagnosis Date Noted   Primary hypertension 08/11/2023   Statin intolerance 02/05/2023   Chronic midline low back pain with left-sided sciatica 02/05/2023   Allergic reaction to plant, excluding food 02/05/2023   Bee sting allergy 01/23/2022   AAA (abdominal aortic aneurysm) without rupture (HCC) 09/19/2021   Shortness of breath 08/13/2021   Seasonal allergies 01/09/2021   Seborrheic dermatitis 01/09/2021   History of colon polyps 01/09/2021   Aortic atherosclerosis (HCC)    GERD (gastroesophageal reflux disease) 11/24/2020   Arthritis 11/24/2020   Leg cramps 11/24/2020   Bilateral carpal tunnel syndrome 11/24/2020   Chronic left shoulder pain 11/24/2020   Hyperlipidemia LDL goal <100 01/31/2016   REFERRING PROVIDER: Gabriel Earing, FNP   REFERRING DIAG: Left leg pain, Acute pain of left knee, Lumbar radiculopathy   Rationale for Evaluation and Treatment:  Rehabilitation  THERAPY DIAG:  Pain in left thigh  Pain in left hip  ONSET DATE: 6-7 weeks ago  SUBJECTIVE:                                                                                                                                                                                           SUBJECTIVE STATEMENT: Patient reports that his cramping has not been bad since his last appointment. He notes that he has been walking quite a bit today and it hasn't been too bad.   PERTINENT HISTORY:  Abdominal aortic aneurysm, carpal tunnel, arthritis, and hypertension   PAIN:  Are you having pain? Yes: NPRS scale: 2/10 Pain location: left hip along hamstring Pain description: sharp and shooting Aggravating factors: certain movements such as twisting, stairs, and sleeping Relieving factors: medication  PRECAUTIONS: None  RED FLAGS: None   WEIGHT BEARING RESTRICTIONS: No  FALLS:  Has patient fallen in last 6 months?  No, but he has to be careful  LIVING ENVIRONMENT: Lives with: lives with their spouse Lives in: House/apartment Stairs: Yes: External: 4 steps; can reach both; reciprocal pattern, but he feels like he "hobbles"  Has following equipment at home: None  OCCUPATION: retired  PLOF: Independent  PATIENT GOALS: reduced pain  NEXT MD VISIT: 08/11/23  OBJECTIVE:  Note: Objective measures were completed at Evaluation unless otherwise noted.  PATIENT SURVEYS:  FOTO 43.01  SCREENING FOR RED FLAGS: Bowel or bladder incontinence: No Spinal tumors: No Cauda equina syndrome: No Compression fracture: No Abdominal aneurysm: Yes:    COGNITION: Overall cognitive status: Within functional limits for tasks assessed     SENSATION: Patient reports intermittent numbness and tingling in posterior left thigh  POSTURE: forward head  PALPATION: TTP: left hip adductors, hamstrings (medial hamstrings reproduced familiar pain), quadriceps, gluteals (reproduced familiar  referred pain, and piriformis (reproduced familiar referred pain)   LUMBAR ROM:   AROM eval  Flexion 70  Extension 20; familiar pain   Right lateral flexion   Left lateral flexion   Right rotation 50% limited; familiar pain  Left rotation 25% limited; midline low back pain   (Blank rows = not tested)  LOWER EXTREMITY ROM:     Active  Right eval Left eval  Hip flexion 104 75  Hip extension    Hip abduction    Hip adduction    Hip internal rotation    Hip external rotation    Knee flexion 134 102  Knee extension    Ankle dorsiflexion    Ankle plantarflexion    Ankle inversion    Ankle eversion     (Blank rows = not tested)  LOWER EXTREMITY MMT:    MMT Right eval Left eval  Hip flexion 4+/5 4-/5  Hip extension    Hip abduction    Hip adduction    Hip internal rotation    Hip external rotation    Knee flexion 4/5 3/5; limited by familiar pain  Knee extension 4+/5 3/5; limited by familiar pain  Ankle dorsiflexion 4-/5 3/5  Ankle plantarflexion    Ankle inversion    Ankle eversion     (Blank rows = not tested)  GAIT: Assistive device utilized: None Level of assistance: Complete Independence Comments: no significant gait deviations observed  TODAY'S TREATMENT:                                                                                                                              DATE:  08/14/23 EXERCISE LOG  Exercise Repetitions and Resistance Comments  Resisted pull down  Blue XTS x 3 minutes   Rocker board  5 minutes    Standing HS curl  20 reps each    LAQ 4# x 3 minutes  Alternating LE  Seated hip ADD isometric  3.5 minutes w/ 5 second hold    Ball roll out  3 minutes   Wall squat  30 reps   With red ball behind his back   Multifidus press out  B/G t-band x 20 reps each     Blank cell = exercise not performed today                                    08/12/23 EXERCISE LOG  Exercise Repetitions and Resistance  Comments  Rocker board  6 minutes   Seated hip ADD isometric  3.5 minutes   Seated heel slide  3 minutes  LLE only; reported reduced hamstring pain after this intervention  LAQ 30 reps  LLE only  Eccentric heel tap  6" step x 20 reps  LLE on step; he experienced a cramp in his left hamstring after this intervention       Blank cell = exercise not performed today  Manual Therapy Soft Tissue Mobilization: left hamstring, for reduced pain and tone                                     08/07/23 EXERCISE LOG  Exercise Repetitions and Resistance Comments  LAQ 3 minutes w/ 5 second hold  Alternating LE  Seated hamstring set 3 minutes w/ 5 second hold    Glute sets  3 minutes w/ 5 second hold             Blank cell = exercise not performed today  Modalities: no redness or adverse reaction to today's modalities  Date:  Unattended Estim: bilateral gluteals, IFC @ 80-150 Hz w/ 40% scan, 15 mins, Pain  PATIENT EDUCATION:  Education details: HEP, POC, healing, referred pain, activity modification, and anatomy Person educated: Patient Education method: Explanation Education comprehension: verbalized understanding  HOME EXERCISE PROGRAM: [id number]  ASSESSMENT:  CLINICAL IMPRESSION: Patient was progressed with new and familiar interventions for improved lumbar and lower extremity mobility and strength with moderate difficulty. He required minimal cueing with today's new interventions for proper exercise performance. He experienced no increase in pain or discomfort with any of today's interventions. He reported feeling "exercised" upon the conclusion of treatment. He continues to require skilled physical therapy to address his remaining impairment to return to his prior level of function.    OBJECTIVE IMPAIRMENTS: decreased activity tolerance, decreased mobility, difficulty walking, decreased ROM, decreased strength, increased muscle spasms, impaired flexibility, impaired sensation, impaired tone,  and pain.   ACTIVITY LIMITATIONS: lifting, standing, squatting, sleeping, stairs, and locomotion level  PARTICIPATION LIMITATIONS: cleaning, community activity, and yard work  PERSONAL FACTORS: Past/current experiences, Time since onset of injury/illness/exacerbation, and 3+ comorbidities: Abdominal aortic aneurysm, carpal tunnel, arthritis, and hypertension   are also affecting patient's functional outcome.   REHAB POTENTIAL: Good  CLINICAL DECISION MAKING: Evolving/moderate complexity  EVALUATION COMPLEXITY: Moderate   GOALS: Goals reviewed with patient? Yes  LONG TERM GOALS: Target date: 08/31/23  Patient will be independent with his HEP. Baseline:  Goal status: INITIAL  2.  Patient  will improve his active left knee flexion to at least 120 degrees for improved function descending stairs. Baseline:  Goal status: INITIAL  3.  Patient will be able to complete his daily activities without his familiar pain exceeding 6/10. Baseline:  Goal status: INITIAL  4.  Patient will improve his left knee flexion and extension strength to at least 4-/5 for improved function with his daily activities. Baseline:  Goal status: INITIAL  5.  Patient will be able to demonstrate at least 95 degrees of left hip flexion for improved function squatting. Baseline:  Goal status: INITIAL  PLAN:  PT FREQUENCY: 2x/week  PT DURATION: 4 weeks  PLANNED INTERVENTIONS: 97164- PT Re-evaluation, 97110-Therapeutic exercises, 97530- Therapeutic activity, [id number]- Neuromuscular re-education, 97535- Self Care, 16109- Manual therapy, 97014- Electrical stimulation (unattended), 60454- Traction (mechanical), Patient/Family education, Balance training, Joint mobilization, Spinal mobilization, Cryotherapy, and Moist heat.  PLAN FOR NEXT SESSION: Nustep, review and updated HEP, isometrics, heel slides, manual therapy, and modalities as needed   Granville Lewis, PT 08/14/2023, 5:16 PM

## 2023-08-19 ENCOUNTER — Ambulatory Visit: Payer: Medicare HMO

## 2023-08-19 DIAGNOSIS — M25552 Pain in left hip: Secondary | ICD-10-CM

## 2023-08-19 DIAGNOSIS — M79652 Pain in left thigh: Secondary | ICD-10-CM

## 2023-08-19 NOTE — Therapy (Signed)
OUTPATIENT PHYSICAL THERAPY THORACOLUMBAR TREATMENT   Patient Name: James Davenport MRN: [id number] DOB:[birth year], 67 y.o., male Today's Date: 08/19/2023  END OF SESSION:  PT End of Session - 08/19/23 1615     Visit Number 6    Number of Visits 8    Date for PT Re-Evaluation 10/03/23    PT Start Time 1600    PT Stop Time 1645    PT Time Calculation (min) 45 min    Activity Tolerance Patient tolerated treatment well    Behavior During Therapy [facility] for tasks assessed/performed                  Past Medical History:  Diagnosis Date   AAA (abdominal aortic aneurysm) without rupture (HCC) 09/19/2021   Aortic atherosclerosis (HCC)    Arthritis 11/24/2020   Bilateral carpal tunnel syndrome 11/24/2020   Chronic left shoulder pain 11/24/2020   GERD (gastroesophageal reflux disease) 11/24/2020   Hyperlipidemia LDL goal <100 01/31/2016   Hypertension    Leg cramps 11/24/2020   Past Surgical History:  Procedure Laterality Date   LEG SURGERY Right 1975   Patient Active Problem List   Diagnosis Date Noted   Primary hypertension 08/11/2023   Statin intolerance 02/05/2023   Chronic midline low back pain with left-sided sciatica 02/05/2023   Allergic reaction to plant, excluding food 02/05/2023   Bee sting allergy 01/23/2022   AAA (abdominal aortic aneurysm) without rupture (HCC) 09/19/2021   Shortness of breath 08/13/2021   Seasonal allergies 01/09/2021   Seborrheic dermatitis 01/09/2021   History of colon polyps 01/09/2021   Aortic atherosclerosis (HCC)    GERD (gastroesophageal reflux disease) 11/24/2020   Arthritis 11/24/2020   Leg cramps 11/24/2020   Bilateral carpal tunnel syndrome 11/24/2020   Chronic left shoulder pain 11/24/2020   Hyperlipidemia LDL goal <100 01/31/2016   REFERRING PROVIDER: Gabriel Earing, FNP   REFERRING DIAG: Left leg pain, Acute pain of left knee, Lumbar radiculopathy   Rationale for Evaluation and Treatment:  Rehabilitation  THERAPY DIAG:  Pain in left thigh  Pain in left hip  ONSET DATE: 6-7 weeks ago  SUBJECTIVE:                                                                                                                                                                                           SUBJECTIVE STATEMENT: Patient reports that his lower leg is the main thing bothering him today. He has also noticed that his cramps have become less frequent.   PERTINENT HISTORY:  Abdominal aortic aneurysm, carpal tunnel, arthritis, and hypertension   PAIN:  Are you having pain? Yes:  NPRS scale: 4/10 Pain location: left hip along hamstring Pain description: sharp and shooting Aggravating factors: certain movements such as twisting, stairs, and sleeping Relieving factors: medication  PRECAUTIONS: None  RED FLAGS: None   WEIGHT BEARING RESTRICTIONS: No  FALLS:  Has patient fallen in last 6 months?  No, but he has to be careful  LIVING ENVIRONMENT: Lives with: lives with their spouse Lives in: House/apartment Stairs: Yes: External: 4 steps; can reach both; reciprocal pattern, but he feels like he "hobbles"  Has following equipment at home: None  OCCUPATION: retired  PLOF: Independent  PATIENT GOALS: reduced pain  NEXT MD VISIT: 08/11/23  OBJECTIVE:  Note: Objective measures were completed at Evaluation unless otherwise noted.  PATIENT SURVEYS:  FOTO 43.01  SCREENING FOR RED FLAGS: Bowel or bladder incontinence: No Spinal tumors: No Cauda equina syndrome: No Compression fracture: No Abdominal aneurysm: Yes:    COGNITION: Overall cognitive status: Within functional limits for tasks assessed     SENSATION: Patient reports intermittent numbness and tingling in posterior left thigh  POSTURE: forward head  PALPATION: TTP: left hip adductors, hamstrings (medial hamstrings reproduced familiar pain), quadriceps, gluteals (reproduced familiar referred pain, and piriformis  (reproduced familiar referred pain)   LUMBAR ROM:   AROM eval  Flexion 70  Extension 20; familiar pain   Right lateral flexion   Left lateral flexion   Right rotation 50% limited; familiar pain  Left rotation 25% limited; midline low back pain   (Blank rows = not tested)  LOWER EXTREMITY ROM:     Active  Right eval Left eval  Hip flexion 104 75  Hip extension    Hip abduction    Hip adduction    Hip internal rotation    Hip external rotation    Knee flexion 134 102  Knee extension    Ankle dorsiflexion    Ankle plantarflexion    Ankle inversion    Ankle eversion     (Blank rows = not tested)  LOWER EXTREMITY MMT:    MMT Right eval Left eval  Hip flexion 4+/5 4-/5  Hip extension    Hip abduction    Hip adduction    Hip internal rotation    Hip external rotation    Knee flexion 4/5 3/5; limited by familiar pain  Knee extension 4+/5 3/5; limited by familiar pain  Ankle dorsiflexion 4-/5 3/5  Ankle plantarflexion    Ankle inversion    Ankle eversion     (Blank rows = not tested)  GAIT: Assistive device utilized: None Level of assistance: Complete Independence Comments: no significant gait deviations observed  TODAY'S TREATMENT:                                                                                                                              DATE:  08/19/23 EXERCISE LOG  Exercise Repetitions and Resistance Comments  Rocker board  5 minutes   Tandem stance on foam  4 x 30 seconds each  Without UE support  Standing hip ABD  3 minutes  Alternating LE   Standing HS curl  3 minutes  Alternating LE  Wall squat  3.5 minutes  With red ball behind his back   Seated clams  Green t-band x 40 reps      Blank cell = exercise not performed today                                    08/14/23 EXERCISE LOG  Exercise Repetitions and Resistance Comments  Resisted pull down  Blue XTS x 3 minutes   Rocker board  5 minutes     Standing HS curl  20 reps each    LAQ 4# x 3 minutes  Alternating LE  Seated hip ADD isometric  3.5 minutes w/ 5 second hold    Ball roll out  3 minutes   Wall squat  30 reps   With red ball behind his back   Multifidus press out  B/G t-band x 20 reps each     Blank cell = exercise not performed today                                    08/12/23 EXERCISE LOG  Exercise Repetitions and Resistance Comments  Rocker board  6 minutes   Seated hip ADD isometric  3.5 minutes   Seated heel slide  3 minutes  LLE only; reported reduced hamstring pain after this intervention  LAQ 30 reps  LLE only  Eccentric heel tap  6" step x 20 reps  LLE on step; he experienced a cramp in his left hamstring after this intervention       Blank cell = exercise not performed today  Manual Therapy Soft Tissue Mobilization: left hamstring, for reduced pain and tone    PATIENT EDUCATION:  Education details: symptom management, HEP, and healing Person educated: Patient Education method: Explanation Education comprehension: verbalized understanding  HOME EXERCISE PROGRAM: [id number]  ASSESSMENT:  CLINICAL IMPRESSION: Patient was progressed with multiple new and familiar standing interventions for improved function with his daily activities. He required minimal cueing with today's new interventions for proper exercise performance. He was educated on ways to manage his symptoms using his HEP. He reported understanding. He reported feeling a little more sore upon the conclusion of treatment. He continues to require skilled physical therapy to address his remaining impairments to return to his prior level of function.   OBJECTIVE IMPAIRMENTS: decreased activity tolerance, decreased mobility, difficulty walking, decreased ROM, decreased strength, increased muscle spasms, impaired flexibility, impaired sensation, impaired tone, and pain.   ACTIVITY LIMITATIONS: lifting, standing, squatting, sleeping, stairs, and  locomotion level  PARTICIPATION LIMITATIONS: cleaning, community activity, and yard work  PERSONAL FACTORS: Past/current experiences, Time since onset of injury/illness/exacerbation, and 3+ comorbidities: Abdominal aortic aneurysm, carpal tunnel, arthritis, and hypertension   are also affecting patient's functional outcome.   REHAB POTENTIAL: Good  CLINICAL DECISION MAKING: Evolving/moderate complexity  EVALUATION COMPLEXITY: Moderate   GOALS: Goals reviewed with patient? Yes  LONG TERM GOALS: Target date: 08/31/23  Patient will be independent with his HEP. Baseline:  Goal status: INITIAL  2.  Patient will improve his  active left knee flexion to at least 120 degrees for improved function descending stairs. Baseline:  Goal status: INITIAL  3.  Patient will be able to complete his daily activities without his familiar pain exceeding 6/10. Baseline:  Goal status: INITIAL  4.  Patient will improve his left knee flexion and extension strength to at least 4-/5 for improved function with his daily activities. Baseline:  Goal status: INITIAL  5.  Patient will be able to demonstrate at least 95 degrees of left hip flexion for improved function squatting. Baseline:  Goal status: INITIAL  PLAN:  PT FREQUENCY: 2x/week  PT DURATION: 4 weeks  PLANNED INTERVENTIONS: 97164- PT Re-evaluation, 97110-Therapeutic exercises, 97530- Therapeutic activity, [id number]- Neuromuscular re-education, 97535- Self Care, 16109- Manual therapy, 97014- Electrical stimulation (unattended), 60454- Traction (mechanical), Patient/Family education, Balance training, Joint mobilization, Spinal mobilization, Cryotherapy, and Moist heat.  PLAN FOR NEXT SESSION: Nustep, review and updated HEP, isometrics, heel slides, manual therapy, and modalities as needed   Granville Lewis, PT 08/19/2023, 5:48 PM

## 2023-08-20 ENCOUNTER — Other Ambulatory Visit: Payer: Self-pay | Admitting: Family Medicine

## 2023-08-20 DIAGNOSIS — J301 Allergic rhinitis due to pollen: Secondary | ICD-10-CM

## 2023-08-21 ENCOUNTER — Ambulatory Visit: Payer: Medicare HMO

## 2023-08-21 DIAGNOSIS — M79652 Pain in left thigh: Secondary | ICD-10-CM | POA: Diagnosis not present

## 2023-08-21 DIAGNOSIS — M25552 Pain in left hip: Secondary | ICD-10-CM

## 2023-08-21 NOTE — Therapy (Signed)
OUTPATIENT PHYSICAL THERAPY THORACOLUMBAR TREATMENT   Patient Name: James Davenport MRN: [id number] DOB:August 05, 1956, 67 y.o., male Today's Date: 08/21/2023  END OF SESSION:  PT End of Session - 08/21/23 1602     Visit Number 7    Number of Visits 8    Date for PT Re-Evaluation 10/03/23    PT Start Time 1600    PT Stop Time 1640    PT Time Calculation (min) 40 min    Activity Tolerance Patient tolerated treatment well    Behavior During Therapy [facility] for tasks assessed/performed                   Past Medical History:  Diagnosis Date   AAA (abdominal aortic aneurysm) without rupture (HCC) 09/19/2021   Aortic atherosclerosis (HCC)    Arthritis 11/24/2020   Bilateral carpal tunnel syndrome 11/24/2020   Chronic left shoulder pain 11/24/2020   GERD (gastroesophageal reflux disease) 11/24/2020   Hyperlipidemia LDL goal <100 01/31/2016   Hypertension    Leg cramps 11/24/2020   Past Surgical History:  Procedure Laterality Date   LEG SURGERY Right 1975   Patient Active Problem List   Diagnosis Date Noted   Primary hypertension 08/11/2023   Statin intolerance 02/05/2023   Chronic midline low back pain with left-sided sciatica 02/05/2023   Allergic reaction to plant, excluding food 02/05/2023   Bee sting allergy 01/23/2022   AAA (abdominal aortic aneurysm) without rupture (HCC) 09/19/2021   Shortness of breath 08/13/2021   Seasonal allergies 01/09/2021   Seborrheic dermatitis 01/09/2021   History of colon polyps 01/09/2021   Aortic atherosclerosis (HCC)    GERD (gastroesophageal reflux disease) 11/24/2020   Arthritis 11/24/2020   Leg cramps 11/24/2020   Bilateral carpal tunnel syndrome 11/24/2020   Chronic left shoulder pain 11/24/2020   Hyperlipidemia LDL goal <100 01/31/2016   REFERRING PROVIDER: Gabriel Earing, FNP   REFERRING DIAG: Left leg pain, Acute pain of left knee, Lumbar radiculopathy   Rationale for Evaluation and Treatment:  Rehabilitation  THERAPY DIAG:  Pain in left thigh  Pain in left hip  ONSET DATE: 6-7 weeks ago  SUBJECTIVE:                                                                                                                                                                                           SUBJECTIVE STATEMENT: Patient reports that he has not had any cramps in his legs since last week.   PERTINENT HISTORY:  Abdominal aortic aneurysm, carpal tunnel, arthritis, and hypertension   PAIN:  Are you having pain? Yes: NPRS scale: 3/10 Pain location: left hip along  hamstring Pain description: sharp and shooting Aggravating factors: certain movements such as twisting, stairs, and sleeping Relieving factors: medication  PRECAUTIONS: None  RED FLAGS: None   WEIGHT BEARING RESTRICTIONS: No  FALLS:  Has patient fallen in last 6 months?  No, but he has to be careful  LIVING ENVIRONMENT: Lives with: lives with their spouse Lives in: House/apartment Stairs: Yes: External: 4 steps; can reach both; reciprocal pattern, but he feels like he "hobbles"  Has following equipment at home: None  OCCUPATION: retired  PLOF: Independent  PATIENT GOALS: reduced pain  NEXT MD VISIT: 08/11/23  OBJECTIVE:  Note: Objective measures were completed at Evaluation unless otherwise noted.  PATIENT SURVEYS:  FOTO 56.41 on 08/21/23  SCREENING FOR RED FLAGS: Bowel or bladder incontinence: No Spinal tumors: No Cauda equina syndrome: No Compression fracture: No Abdominal aneurysm: Yes:    COGNITION: Overall cognitive status: Within functional limits for tasks assessed     SENSATION: Patient reports intermittent numbness and tingling in posterior left thigh  POSTURE: forward head  PALPATION: TTP: left hip adductors, hamstrings (medial hamstrings reproduced familiar pain), quadriceps, gluteals (reproduced familiar referred pain, and piriformis (reproduced familiar referred pain)   LUMBAR  ROM:   AROM eval  Flexion 70  Extension 20; familiar pain   Right lateral flexion   Left lateral flexion   Right rotation 50% limited; familiar pain  Left rotation 25% limited; midline low back pain   (Blank rows = not tested)  LOWER EXTREMITY ROM:     Active  Right eval Left eval  Hip flexion 104 75  Hip extension    Hip abduction    Hip adduction    Hip internal rotation    Hip external rotation    Knee flexion 134 102  Knee extension    Ankle dorsiflexion    Ankle plantarflexion    Ankle inversion    Ankle eversion     (Blank rows = not tested)  LOWER EXTREMITY MMT:    MMT Right eval Left eval  Hip flexion 4+/5 4-/5  Hip extension    Hip abduction    Hip adduction    Hip internal rotation    Hip external rotation    Knee flexion 4/5 3/5; limited by familiar pain  Knee extension 4+/5 3/5; limited by familiar pain  Ankle dorsiflexion 4-/5 3/5  Ankle plantarflexion    Ankle inversion    Ankle eversion     (Blank rows = not tested)  GAIT: Assistive device utilized: None Level of assistance: Complete Independence Comments: no significant gait deviations observed  TODAY'S TREATMENT:                                                                                                                              DATE:  08/21/23 EXERCISE LOG  Exercise Repetitions and Resistance Comments  Rocker board  5 minutes   Standing hip ABD  30 reps  LLE only                Blank cell = exercise not performed today  Manual Therapy Soft Tissue Mobilization: left IT band and hamstring, for reduced pain and tone                                     08/19/23 EXERCISE LOG  Exercise Repetitions and Resistance Comments  Rocker board  5 minutes   Tandem stance on foam  4 x 30 seconds each  Without UE support  Standing hip ABD  3 minutes  Alternating LE   Standing HS curl  3 minutes  Alternating LE  Wall squat  3.5 minutes  With red ball  behind his back   Seated clams  Green t-band x 40 reps      Blank cell = exercise not performed today                                    08/14/23 EXERCISE LOG  Exercise Repetitions and Resistance Comments  Resisted pull down  Blue XTS x 3 minutes   Rocker board  5 minutes    Standing HS curl  20 reps each    LAQ 4# x 3 minutes  Alternating LE  Seated hip ADD isometric  3.5 minutes w/ 5 second hold    Ball roll out  3 minutes   Wall squat  30 reps   With red ball behind his back   Multifidus press out  B/G t-band x 20 reps each     Blank cell = exercise not performed today   PATIENT EDUCATION:  Education details: symptom management, HEP, and healing Person educated: Patient Education method: Explanation Education comprehension: verbalized understanding  HOME EXERCISE PROGRAM: [id number]  ASSESSMENT:  CLINICAL IMPRESSION: Today's treatment focused on reduced pain through the use of manual therapy and appropriately matched interventions. Soft tissue mobilization to his left hamstring and iliotibial band was the most effective at reducing his familiar symptoms. He was educated on how he could utilize a rolling pin to reduce his familiar pain. He reported feeling better upon the conclusion of treatment. Recommend that he continue with skilled physical therapy to address his remaining impairments to return to his prior level of function.   OBJECTIVE IMPAIRMENTS: decreased activity tolerance, decreased mobility, difficulty walking, decreased ROM, decreased strength, increased muscle spasms, impaired flexibility, impaired sensation, impaired tone, and pain.   ACTIVITY LIMITATIONS: lifting, standing, squatting, sleeping, stairs, and locomotion level  PARTICIPATION LIMITATIONS: cleaning, community activity, and yard work  PERSONAL FACTORS: Past/current experiences, Time since onset of injury/illness/exacerbation, and 3+ comorbidities: Abdominal aortic aneurysm, carpal tunnel, arthritis, and  hypertension   are also affecting patient's functional outcome.   REHAB POTENTIAL: Good  CLINICAL DECISION MAKING: Evolving/moderate complexity  EVALUATION COMPLEXITY: Moderate   GOALS: Goals reviewed with patient? Yes  LONG TERM GOALS: Target date: 08/31/23  Patient will be independent with his HEP. Baseline:  Goal status: INITIAL  2.  Patient will improve his active left knee flexion to at least 120 degrees for improved function descending stairs. Baseline:  Goal status: INITIAL  3.  Patient will be able to complete his daily activities  without his familiar pain exceeding 6/10. Baseline:  Goal status: INITIAL  4.  Patient will improve his left knee flexion and extension strength to at least 4-/5 for improved function with his daily activities. Baseline:  Goal status: INITIAL  5.  Patient will be able to demonstrate at least 95 degrees of left hip flexion for improved function squatting. Baseline:  Goal status: INITIAL  PLAN:  PT FREQUENCY: 2x/week  PT DURATION: 4 weeks  PLANNED INTERVENTIONS: 97164- PT Re-evaluation, 97110-Therapeutic exercises, 97530- Therapeutic activity, [id number]- Neuromuscular re-education, 97535- Self Care, [id number]- Manual therapy, 97014- Electrical stimulation (unattended), 91478- Traction (mechanical), Patient/Family education, Balance training, Joint mobilization, Spinal mobilization, Cryotherapy, and Moist heat.  PLAN FOR NEXT SESSION: Nustep, review and updated HEP, isometrics, heel slides, manual therapy, and modalities as needed   Granville Lewis, PT 08/21/2023, 5:48 PM

## 2023-08-22 ENCOUNTER — Other Ambulatory Visit: Payer: Self-pay | Admitting: Family Medicine

## 2023-08-22 DIAGNOSIS — G8929 Other chronic pain: Secondary | ICD-10-CM

## 2023-08-22 DIAGNOSIS — M25522 Pain in left elbow: Secondary | ICD-10-CM

## 2023-08-26 ENCOUNTER — Other Ambulatory Visit: Payer: Self-pay | Admitting: Family Medicine

## 2023-08-26 DIAGNOSIS — I10 Essential (primary) hypertension: Secondary | ICD-10-CM

## 2023-08-29 ENCOUNTER — Ambulatory Visit: Payer: Medicare HMO

## 2023-08-29 DIAGNOSIS — M79652 Pain in left thigh: Secondary | ICD-10-CM | POA: Diagnosis not present

## 2023-08-29 DIAGNOSIS — M25552 Pain in left hip: Secondary | ICD-10-CM

## 2023-08-29 NOTE — Therapy (Addendum)
OUTPATIENT PHYSICAL THERAPY THORACOLUMBAR TREATMENT   Patient Name: James Davenport MRN: [id number] DOB:1956-03-02, 67 y.o., male Today's Date: 08/29/2023  END OF SESSION:  PT End of Session - 08/29/23 0846     Visit Number 8    Number of Visits 8    Date for PT Re-Evaluation 10/03/23    PT Start Time 0845    PT Stop Time 0914    PT Time Calculation (min) 29 min    Activity Tolerance Patient tolerated treatment well    Behavior During Therapy [facility] for tasks assessed/performed                   Past Medical History:  Diagnosis Date   AAA (abdominal aortic aneurysm) without rupture (HCC) 09/19/2021   Aortic atherosclerosis (HCC)    Arthritis 11/24/2020   Bilateral carpal tunnel syndrome 11/24/2020   Chronic left shoulder pain 11/24/2020   GERD (gastroesophageal reflux disease) 11/24/2020   Hyperlipidemia LDL goal <100 01/31/2016   Hypertension    Leg cramps 11/24/2020   Past Surgical History:  Procedure Laterality Date   LEG SURGERY Right 1975   Patient Active Problem List   Diagnosis Date Noted   Primary hypertension 08/11/2023   Statin intolerance 02/05/2023   Chronic midline low back pain with left-sided sciatica 02/05/2023   Allergic reaction to plant, excluding food 02/05/2023   Bee sting allergy 01/23/2022   AAA (abdominal aortic aneurysm) without rupture (HCC) 09/19/2021   Shortness of breath 08/13/2021   Seasonal allergies 01/09/2021   Seborrheic dermatitis 01/09/2021   History of colon polyps 01/09/2021   Aortic atherosclerosis (HCC)    GERD (gastroesophageal reflux disease) 11/24/2020   Arthritis 11/24/2020   Leg cramps 11/24/2020   Bilateral carpal tunnel syndrome 11/24/2020   Chronic left shoulder pain 11/24/2020   Hyperlipidemia LDL goal <100 01/31/2016   REFERRING PROVIDER: Gabriel Earing, FNP   REFERRING DIAG: Left leg pain, Acute pain of left knee, Lumbar radiculopathy   Rationale for Evaluation and Treatment:  Rehabilitation  THERAPY DIAG:  Pain in left thigh  Pain in left hip  ONSET DATE: 6-7 weeks ago  SUBJECTIVE:                                                                                                                                                                                           SUBJECTIVE STATEMENT: Patient reports 4/10 left hip pain.  Pt reports minimal cramping.  Pt ready for discharge today.   PERTINENT HISTORY:  Abdominal aortic aneurysm, carpal tunnel, arthritis, and hypertension   PAIN:  Are you having pain? Yes: NPRS scale: 4/10 Pain location: left  hip along hamstring Pain description: sharp and shooting Aggravating factors: certain movements such as twisting, stairs, and sleeping Relieving factors: medication  PRECAUTIONS: None  RED FLAGS: None   WEIGHT BEARING RESTRICTIONS: No  FALLS:  Has patient fallen in last 6 months?  No, but he has to be careful  LIVING ENVIRONMENT: Lives with: lives with their spouse Lives in: House/apartment Stairs: Yes: External: 4 steps; can reach both; reciprocal pattern, but he feels like he "hobbles"  Has following equipment at home: None  OCCUPATION: retired  PLOF: Independent  PATIENT GOALS: reduced pain  NEXT MD VISIT: 08/11/23  OBJECTIVE:  Note: Objective measures were completed at Evaluation unless otherwise noted.  PATIENT SURVEYS:  FOTO 56.41 on 08/21/23  SCREENING FOR RED FLAGS: Bowel or bladder incontinence: No Spinal tumors: No Cauda equina syndrome: No Compression fracture: No Abdominal aneurysm: Yes:    COGNITION: Overall cognitive status: Within functional limits for tasks assessed     SENSATION: Patient reports intermittent numbness and tingling in posterior left thigh  POSTURE: forward head  PALPATION: TTP: left hip adductors, hamstrings (medial hamstrings reproduced familiar pain), quadriceps, gluteals (reproduced familiar referred pain, and piriformis (reproduced familiar  referred pain)   LUMBAR ROM:   AROM eval  Flexion 70  Extension 20; familiar pain   Right lateral flexion   Left lateral flexion   Right rotation 50% limited; familiar pain  Left rotation 25% limited; midline low back pain   (Blank rows = not tested)  LOWER EXTREMITY ROM:     Active  Right eval Left eval  Hip flexion 104 75  Hip extension    Hip abduction    Hip adduction    Hip internal rotation    Hip external rotation    Knee flexion 134 102  Knee extension    Ankle dorsiflexion    Ankle plantarflexion    Ankle inversion    Ankle eversion     (Blank rows = not tested)  LOWER EXTREMITY MMT:    MMT Right eval Left eval  Hip flexion 4+/5 4-/5  Hip extension    Hip abduction    Hip adduction    Hip internal rotation    Hip external rotation    Knee flexion 4/5 3/5; limited by familiar pain  Knee extension 4+/5 3/5; limited by familiar pain  Ankle dorsiflexion 4-/5 3/5  Ankle plantarflexion    Ankle inversion    Ankle eversion     (Blank rows = not tested)  GAIT: Assistive device utilized: None Level of assistance: Complete Independence Comments: no significant gait deviations observed  TODAY'S TREATMENT:                                                                                                                              DATE:  08/29/23 EXERCISE LOG  Exercise Repetitions and Resistance Comments  Rocker board  5 minutes   Standing hip ABD  30 reps  LLE only                Blank cell = exercise not performed today                                    08/19/23 EXERCISE LOG  Exercise Repetitions and Resistance Comments  Rocker board  5 minutes   Tandem stance on foam  4 x 30 seconds each  Without UE support  Standing hip ABD  3 minutes  Alternating LE   Standing HS curl  3 minutes  Alternating LE  Wall squat  3.5 minutes  With red ball behind his back   Seated clams  Green t-band x 40 reps      Blank cell  = exercise not performed today                                    08/14/23 EXERCISE LOG  Exercise Repetitions and Resistance Comments  Resisted pull down  Blue XTS x 3 minutes   Rocker board  5 minutes    Standing HS curl  20 reps each    LAQ 4# x 3 minutes  Alternating LE  Seated hip ADD isometric  3.5 minutes w/ 5 second hold    Ball roll out  3 minutes   Wall squat  30 reps   With red ball behind his back   Multifidus press out  B/G t-band x 20 reps each     Blank cell = exercise not performed today   PATIENT EDUCATION:  Education details: symptom management, HEP, and healing Person educated: Patient Education method: Explanation Education comprehension: verbalized understanding  HOME EXERCISE PROGRAM: [id number]  ASSESSMENT:  CLINICAL IMPRESSION: Pt arrives for today's treatment session reporting 4/10 left hip pain.  Pt able to increase FOTO score to 63 today surpassing his predicted score.  Pt able to demonstrate 116 degrees of left hip flexion and 120 degrees of left knee flexion.  Pt also able to demonstrate 4/5 left knee flexor and extensor strength today.  Pt has met all the goals set for him at this time.  Pt encouraged to call the facility with any questions or concerns.  Pt reported mild decrease in pain at completion of today's treatment session.  Pt ready for discharge at this time.   PHYSICAL THERAPY DISCHARGE SUMMARY  Visits from Start of Care: 8  Current functional level related to goals / functional outcomes: Patient was able to meet all of his goals for skilled physical therapy.    Remaining deficits: None    Education / Equipment: HEP    Patient agrees to discharge. Patient goals were met. Patient is being discharged due to meeting the stated rehab goals.  Candi Leash, PT, DPT    OBJECTIVE IMPAIRMENTS: decreased activity tolerance, decreased mobility, difficulty walking, decreased ROM, decreased strength, increased muscle spasms, impaired flexibility,  impaired sensation, impaired tone, and pain.   ACTIVITY LIMITATIONS: lifting, standing, squatting, sleeping, stairs, and locomotion level  PARTICIPATION LIMITATIONS: cleaning, community activity, and yard work  PERSONAL FACTORS: Past/current experiences, Time since onset of injury/illness/exacerbation, and 3+ comorbidities: Abdominal aortic aneurysm, carpal tunnel, arthritis, and hypertension   are also affecting patient's functional  outcome.   REHAB POTENTIAL: Good  CLINICAL DECISION MAKING: Evolving/moderate complexity  EVALUATION COMPLEXITY: Moderate   GOALS: Goals reviewed with patient? Yes  LONG TERM GOALS: Target date: 08/31/23  Patient will be independent with his HEP. Baseline:  Goal status: MET  2.  Patient will improve his active left knee flexion to at least 120 degrees for improved function descending stairs. Baseline: 11/22: 120 degrees Goal status: MET  3.  Patient will be able to complete his daily activities without his familiar pain exceeding 6/10. Baseline:  Goal status: MET  4.  Patient will improve his left knee flexion and extension strength to at least 4-/5 for improved function with his daily activities. Baseline: 11/22: 4/5 with flexion and extension Goal status: MET  5.  Patient will be able to demonstrate at least 95 degrees of left hip flexion for improved function squatting. Baseline: 11/22: 116 degrees Goal status: MET  PLAN:  PT FREQUENCY: 2x/week  PT DURATION: 4 weeks  PLANNED INTERVENTIONS: 97164- PT Re-evaluation, 97110-Therapeutic exercises, 97530- Therapeutic activity, 97112- Neuromuscular re-education, 97535- Self Care, 53664- Manual therapy, 97014- Electrical stimulation (unattended), 40347- Traction (mechanical), Patient/Family education, Balance training, Joint mobilization, Spinal mobilization, Cryotherapy, and Moist heat.  PLAN FOR NEXT SESSION: Nustep, review and updated HEP, isometrics, heel slides, manual therapy, and  modalities as needed   Newman Pies, PTA 08/29/2023, 9:27 AM

## 2023-09-06 ENCOUNTER — Other Ambulatory Visit: Payer: Self-pay | Admitting: Family Medicine

## 2023-09-06 DIAGNOSIS — J302 Other seasonal allergic rhinitis: Secondary | ICD-10-CM

## 2023-09-17 DIAGNOSIS — L570 Actinic keratosis: Secondary | ICD-10-CM | POA: Diagnosis not present

## 2023-09-17 DIAGNOSIS — L821 Other seborrheic keratosis: Secondary | ICD-10-CM | POA: Diagnosis not present

## 2023-09-17 DIAGNOSIS — L814 Other melanin hyperpigmentation: Secondary | ICD-10-CM | POA: Diagnosis not present

## 2023-09-17 DIAGNOSIS — L578 Other skin changes due to chronic exposure to nonionizing radiation: Secondary | ICD-10-CM | POA: Diagnosis not present

## 2023-09-22 ENCOUNTER — Other Ambulatory Visit: Payer: Self-pay | Admitting: Family Medicine

## 2023-09-22 DIAGNOSIS — E785 Hyperlipidemia, unspecified: Secondary | ICD-10-CM

## 2023-09-25 ENCOUNTER — Other Ambulatory Visit: Payer: Self-pay | Admitting: Family Medicine

## 2023-09-25 DIAGNOSIS — G5622 Lesion of ulnar nerve, left upper limb: Secondary | ICD-10-CM | POA: Diagnosis not present

## 2023-09-25 DIAGNOSIS — G8929 Other chronic pain: Secondary | ICD-10-CM

## 2023-09-25 DIAGNOSIS — R252 Cramp and spasm: Secondary | ICD-10-CM

## 2023-09-25 DIAGNOSIS — G5602 Carpal tunnel syndrome, left upper limb: Secondary | ICD-10-CM | POA: Diagnosis not present

## 2023-10-15 ENCOUNTER — Other Ambulatory Visit: Payer: Self-pay | Admitting: Family Medicine

## 2023-10-15 DIAGNOSIS — L219 Seborrheic dermatitis, unspecified: Secondary | ICD-10-CM

## 2023-11-03 ENCOUNTER — Other Ambulatory Visit: Payer: Self-pay | Admitting: Family Medicine

## 2023-11-03 DIAGNOSIS — Z91030 Bee allergy status: Secondary | ICD-10-CM

## 2023-11-03 DIAGNOSIS — G8929 Other chronic pain: Secondary | ICD-10-CM

## 2023-11-03 DIAGNOSIS — M25522 Pain in left elbow: Secondary | ICD-10-CM

## 2023-11-10 ENCOUNTER — Other Ambulatory Visit: Payer: Self-pay | Admitting: Family Medicine

## 2023-11-10 DIAGNOSIS — G8929 Other chronic pain: Secondary | ICD-10-CM

## 2023-11-10 DIAGNOSIS — R252 Cramp and spasm: Secondary | ICD-10-CM

## 2023-11-19 ENCOUNTER — Ambulatory Visit (INDEPENDENT_AMBULATORY_CARE_PROVIDER_SITE_OTHER): Payer: Medicare HMO | Admitting: Family Medicine

## 2023-11-19 ENCOUNTER — Encounter: Payer: Self-pay | Admitting: Family Medicine

## 2023-11-19 VITALS — BP 113/72 | HR 78 | Temp 97.7°F | Ht 69.0 in | Wt 207.0 lb

## 2023-11-19 DIAGNOSIS — R7309 Other abnormal glucose: Secondary | ICD-10-CM | POA: Diagnosis not present

## 2023-11-19 DIAGNOSIS — R062 Wheezing: Secondary | ICD-10-CM

## 2023-11-19 DIAGNOSIS — Z6830 Body mass index (BMI) 30.0-30.9, adult: Secondary | ICD-10-CM | POA: Diagnosis not present

## 2023-11-19 DIAGNOSIS — E66811 Obesity, class 1: Secondary | ICD-10-CM

## 2023-11-19 DIAGNOSIS — Z789 Other specified health status: Secondary | ICD-10-CM

## 2023-11-19 DIAGNOSIS — E6609 Other obesity due to excess calories: Secondary | ICD-10-CM | POA: Insufficient documentation

## 2023-11-19 DIAGNOSIS — J069 Acute upper respiratory infection, unspecified: Secondary | ICD-10-CM

## 2023-11-19 DIAGNOSIS — I700 Atherosclerosis of aorta: Secondary | ICD-10-CM | POA: Diagnosis not present

## 2023-11-19 DIAGNOSIS — I10 Essential (primary) hypertension: Secondary | ICD-10-CM | POA: Diagnosis not present

## 2023-11-19 DIAGNOSIS — Z0000 Encounter for general adult medical examination without abnormal findings: Secondary | ICD-10-CM

## 2023-11-19 DIAGNOSIS — E785 Hyperlipidemia, unspecified: Secondary | ICD-10-CM

## 2023-11-19 DIAGNOSIS — Z0001 Encounter for general adult medical examination with abnormal findings: Secondary | ICD-10-CM

## 2023-11-19 LAB — LIPID PANEL

## 2023-11-19 LAB — BAYER DCA HB A1C WAIVED: HB A1C (BAYER DCA - WAIVED): 5.9 % — ABNORMAL HIGH (ref 4.8–5.6)

## 2023-11-19 MED ORDER — AMOXICILLIN 875 MG PO TABS: 875.0000 mg | ORAL_TABLET | Freq: Two times a day (BID) | ORAL | 0 refills | Status: AC

## 2023-11-19 MED ORDER — PREDNISONE 20 MG PO TABS: 40.0000 mg | ORAL_TABLET | Freq: Every day | ORAL | 0 refills | Status: AC

## 2023-11-19 NOTE — Patient Instructions (Signed)
Health Maintenance, Male  Adopting a healthy lifestyle and getting preventive care are important in promoting health and wellness. Ask your health care provider about:  The right schedule for you to have regular tests and exams.  Things you can do on your own to prevent diseases and keep yourself healthy.  What should I know about diet, weight, and exercise?  Eat a healthy diet    Eat a diet that includes plenty of vegetables, fruits, low-fat dairy products, and lean protein.  Do not eat a lot of foods that are high in solid fats, added sugars, or sodium.  Maintain a healthy weight  Body mass index (BMI) is a measurement that can be used to identify possible weight problems. It estimates body fat based on height and weight. Your health care provider can help determine your BMI and help you achieve or maintain a healthy weight.  Get regular exercise  Get regular exercise. This is one of the most important things you can do for your health. Most adults should:  Exercise for at least 150 minutes each week. The exercise should increase your heart rate and make you sweat (moderate-intensity exercise).  Do strengthening exercises at least twice a week. This is in addition to the moderate-intensity exercise.  Spend less time sitting. Even light physical activity can be beneficial.  Watch cholesterol and blood lipids  Have your blood tested for lipids and cholesterol at 69 years of age, then have this test every 5 years.  You may need to have your cholesterol levels checked more often if:  Your lipid or cholesterol levels are high.  You are older than 69 years of age.  You are at high risk for heart disease.  What should I know about cancer screening?  Many types of cancers can be detected early and may often be prevented. Depending on your health history and family history, you may need to have cancer screening at various ages. This may include screening for:  Colorectal cancer.  Prostate cancer.  Skin cancer.  Lung  cancer.  What should I know about heart disease, diabetes, and high blood pressure?  Blood pressure and heart disease  High blood pressure causes heart disease and increases the risk of stroke. This is more likely to develop in people who have high blood pressure readings or are overweight.  Talk with your health care provider about your target blood pressure readings.  Have your blood pressure checked:  Every 3-5 years if you are 9-95 years of age.  Every year if you are 85 years old or older.  If you are between the ages of 29 and 29 and are a current or former smoker, ask your health care provider if you should have a one-time screening for abdominal aortic aneurysm (AAA).  Diabetes  Have regular diabetes screenings. This checks your fasting blood sugar level. Have the screening done:  Once every three years after age 23 if you are at a normal weight and have a low risk for diabetes.  More often and at a younger age if you are overweight or have a high risk for diabetes.  What should I know about preventing infection?  Hepatitis B  If you have a higher risk for hepatitis B, you should be screened for this virus. Talk with your health care provider to find out if you are at risk for hepatitis B infection.  Hepatitis C  Blood testing is recommended for:  Everyone born from [birth year] through 1966.  Anyone  with known risk factors for hepatitis C.  Sexually transmitted infections (STIs)  You should be screened each year for STIs, including gonorrhea and chlamydia, if:  You are sexually active and are younger than 69 years of age.  You are older than 69 years of age and your health care provider tells you that you are at risk for this type of infection.  Your sexual activity has changed since you were last screened, and you are at increased risk for chlamydia or gonorrhea. Ask your health care provider if you are at risk.  Ask your health care provider about whether you are at high risk for HIV. Your health care provider  may recommend a prescription medicine to help prevent HIV infection. If you choose to take medicine to prevent HIV, you should first get tested for HIV. You should then be tested every 3 months for as long as you are taking the medicine.  Follow these instructions at home:  Alcohol use  Do not drink alcohol if your health care provider tells you not to drink.  If you drink alcohol:  Limit how much you have to 0-2 drinks a day.  Know how much alcohol is in your drink. In the [place] one drink equals one 12 oz bottle of beer (355 mL), one 5 oz glass of wine (148 mL), or one 1 oz glass of hard liquor (44 mL).  Lifestyle  Do not use any products that contain nicotine or tobacco. These products include cigarettes, chewing tobacco, and vaping devices, such as e-cigarettes. If you need help quitting, ask your health care provider.  Do not use street drugs.  Do not share needles.  Ask your health care provider for help if you need support or information about quitting drugs.  General instructions  Schedule regular health, dental, and eye exams.  Stay current with your vaccines.  Tell your health care provider if:  You often feel depressed.  You have ever been abused or do not feel safe at home.  Summary  Adopting a healthy lifestyle and getting preventive care are important in promoting health and wellness.  Follow your health care provider's instructions about healthy diet, exercising, and getting tested or screened for diseases.  Follow your health care provider's instructions on monitoring your cholesterol and blood pressure.  This information is not intended to replace advice given to you by your health care provider. Make sure you discuss any questions you have with your health care provider.  Document Revised: 02/12/2021 Document Reviewed: 02/12/2021  Elsevier Patient Education  2024 [employer].

## 2023-11-19 NOTE — Progress Notes (Signed)
Complete physical exam  Patient: James Davenport   DOB: [birth year]   68 y.o. Male  MRN: [id number]  Subjective:    Chief Complaint  Patient presents with   Annual Exam    James Davenport is a 68 y.o. male who presents today for a complete physical exam. He reports consuming a  "pretty good"  diet. The patient has a physically strenuous job, but has no regular exercise apart from work.  He generally feels well. He reports sleeping well. He does have additional problems to discuss today.   He reports cough, congestion for 2-3 days after spreading some hay with mold. Scratchy throat. Having to use albuterol inhaler 5 times or so for wheezing, shortness of breath. No fever, chills, chest pain, nausea, vomiting. No sick contacts.  Most recent fall risk assessment:    11/19/2023    8:22 AM  Fall Risk   Falls in the past year? 0     Most recent depression screenings:    11/19/2023    8:22 AM 08/11/2023    8:02 AM  PHQ 2/9 Scores  PHQ - 2 Score 0   PHQ- 9 Score 0   Exception Documentation  Patient refusal    Vision:Within last year and Dental: No current dental problems and No regular dental care   Past Medical History:  Diagnosis Date   AAA (abdominal aortic aneurysm) without rupture (HCC) 09/19/2021   Aortic atherosclerosis (HCC)    Arthritis 11/24/2020   Bilateral carpal tunnel syndrome 11/24/2020   Chronic left shoulder pain 11/24/2020   GERD (gastroesophageal reflux disease) 11/24/2020   Hyperlipidemia LDL goal <100 01/31/2016   Hypertension    Leg cramps 11/24/2020      Patient Care Team: Gabriel Earing, FNP as PCP - General (Family Medicine) Jena Gauss Gerrit Friends, MD as Consulting Physician (Gastroenterology) Gabriel Earing, FNP (Family Medicine)   Outpatient Medications Prior to Visit  Medication Sig   acetaminophen (TYLENOL) 500 MG tablet Take 500 mg by mouth every 6 (six) hours as needed.   albuterol (VENTOLIN HFA) 108 (90 Base) MCG/ACT inhaler INHALE 2  PUFFS EVERY 6 HOURS AS NEEDED   aspirin 81 MG chewable tablet Chew 1 tablet by mouth daily at 2 PM.   Bempedoic Acid-Ezetimibe (NEXLIZET) 180-10 MG TABS TAKE 1 TABLET EVERY DAY   cyclobenzaprine (FLEXERIL) 10 MG tablet TAKE 1 TABLET EVERY DAY AS NEEDED FOR MUSCLE SPASM(S)   EPINEPHRINE 0.3 mg/0.3 mL IJ SOAJ injection INJECT 0.3 MG INTO THE MUSCLE AS NEEDED FOR ANAPHYLAXIS.   fluticasone (FLONASE) 50 MCG/ACT nasal spray USE 2 SPRAYS IN EACH NOSTRIL EVERY DAY   gabapentin (NEURONTIN) 300 MG capsule Take 2 capsules (600 mg total) by mouth 2 (two) times daily.   ketoconazole (NIZORAL) 2 % cream Apply 1 application topically daily.   ketoconazole (NIZORAL) 2 % shampoo APPLY TOPICALLY 2 TIMES A WEEK. LEAVE ON FOR 3 TO 5 MINUTES BEFORE RINSING.   levocetirizine (XYZAL) 5 MG tablet TAKE 1 TABLET EVERY EVENING   meloxicam (MOBIC) 15 MG tablet TAKE 1 TABLET EVERY DAY   pantoprazole (PROTONIX) 40 MG tablet Take 1 tablet (40 mg total) by mouth 2 (two) times daily before a meal.   SPIRIVA RESPIMAT 1.25 MCG/ACT AERS INHALE 2 PUFFS ONE TIME DAILY   valsartan (DIOVAN) 80 MG tablet TAKE ONE TABLET BY MOUTH DAILY   predniSONE (STERAPRED UNI-PAK 21 TAB) 10 MG (21) TBPK tablet Use as directed on back of pill pack (Patient not taking: Reported on 11/19/2023)  No facility-administered medications prior to visit.    ROS Negative unless specially indicated above in HPI.     Objective:     BP 113/72   Pulse 78   Temp 97.7 F (36.5 C) (Temporal)   Ht 5\' 9"  (1.753 m)   Wt 207 lb (93.9 kg)   SpO2 94%   BMI 30.57 kg/m    Physical Exam Vitals and nursing note reviewed.  Constitutional:      General: He is not in acute distress.    Appearance: He is obese. He is not ill-appearing, toxic-appearing or diaphoretic.  HENT:     Head: Normocephalic.     Right Ear: Tympanic membrane, ear canal and external ear normal.     Left Ear: Tympanic membrane, ear canal and external ear normal.     Nose: Congestion  present.     Mouth/Throat:     Mouth: Mucous membranes are moist.     Pharynx: Oropharynx is clear. No oropharyngeal exudate or posterior oropharyngeal erythema.  Eyes:     Extraocular Movements: Extraocular movements intact.     Conjunctiva/sclera: Conjunctivae normal.     Pupils: Pupils are equal, round, and reactive to light.  Cardiovascular:     Rate and Rhythm: Normal rate and regular rhythm.     Pulses: Normal pulses.     Heart sounds: Normal heart sounds. No murmur heard.    No friction rub. No gallop.  Pulmonary:     Effort: Pulmonary effort is normal. No respiratory distress.     Breath sounds: Normal breath sounds. No wheezing, rhonchi or rales.  Abdominal:     General: Bowel sounds are normal. There is no distension.     Palpations: Abdomen is soft. There is no mass.     Tenderness: There is no abdominal tenderness. There is no guarding.  Musculoskeletal:        General: No swelling. Normal range of motion.     Cervical back: Normal range of motion and neck supple. No tenderness.  Lymphadenopathy:     Cervical: No cervical adenopathy.  Skin:    General: Skin is warm and dry.     Capillary Refill: Capillary refill takes less than 2 seconds.     Findings: No lesion or rash.  Neurological:     General: No focal deficit present.     Mental Status: He is alert and oriented to person, place, and time.     Cranial Nerves: No cranial nerve deficit.     Motor: No weakness.     Coordination: Coordination normal.     Gait: Gait normal.  Psychiatric:        Mood and Affect: Mood normal.        Behavior: Behavior normal.        Thought Content: Thought content normal.      No results found for any visits on 11/19/23.     Assessment & Plan:    Routine Health Maintenance and Physical Exam  Yardley "James Davenport" was seen today for annual exam.  Diagnoses and all orders for this visit:  Routine general medical examination at a health care facility  Primary  hypertension Well controlled on current regimen.  -     CBC with Differential/Platelet -     CMP14+EGFR -     TSH  Hyperlipidemia LDL goal <100 Fasting panel pending. On nexlizet. -     Lipid panel  Statin intolerance -     Lipid panel  Aortic atherosclerosis (HCC)  On aspirin.   Class 1 obesity due to excess calories with serious comorbidity and body mass index (BMI) of 30.0 to 30.9 in adult Diet, exercise, weight loss.  -     TSH -     Bayer DCA Hb A1c Waived  Wheezing Acute URI Following exposure to mold. Amoxicillin and prednisone burst as below. Continue antihistamines, spiriva. Albuterol prn. Return to office for new or worsening symptoms, or if symptoms persist.  -     predniSONE (DELTASONE) 20 MG tablet; Take 2 tablets (40 mg total) by mouth daily with breakfast for 5 days. -     amoxicillin (AMOXIL) 875 MG tablet; Take 1 tablet (875 mg total) by mouth 2 (two) times daily for 7 days.    Immunization History  Administered Date(s) Administered   Tdap 10/07/2016    Health Maintenance  Topic Date Due   Medicare Annual Wellness (AWV)  10/22/2023   Zoster Vaccines- Shingrix (1 of 2) 12/11/2023 (Originally 09/04/2005)   INFLUENZA VACCINE  01/05/2024 (Originally 05/08/2023)   COVID-19 Vaccine (1 - 2024-25 season) 08/26/2024 (Originally 06/08/2023)   Pneumonia Vaccine 55+ Years old (1 of 1 - PCV) 11/18/2024 (Originally 09/04/2020)   Fecal DNA (Cologuard)  06/25/2025   DTaP/Tdap/Td (2 - Td or Tdap) 10/07/2026   Hepatitis C Screening  Completed   HPV VACCINES  Aged Out   Colonoscopy  Discontinued    Discussed health benefits of physical activity, and encouraged him to engage in regular exercise appropriate for his age and condition.  Problem List Items Addressed This Visit       Cardiovascular and Mediastinum   Aortic atherosclerosis (HCC)   Primary hypertension   Relevant Orders   CBC with Differential/Platelet   CMP14+EGFR   TSH     Other   Hyperlipidemia LDL  goal <100   Relevant Orders   Lipid panel   Statin intolerance   Relevant Orders   Lipid panel   Class 1 obesity due to excess calories with serious comorbidity and body mass index (BMI) of 30.0 to 30.9 in adult   Relevant Orders   TSH   Bayer DCA Hb A1c Waived   Other Visit Diagnoses       Routine general medical examination at a health care facility    -  Primary     Wheezing       Relevant Medications   predniSONE (DELTASONE) 20 MG tablet   amoxicillin (AMOXIL) 875 MG tablet     Acute URI       Relevant Medications   amoxicillin (AMOXIL) 875 MG tablet      Return in about 6 months (around 05/18/2024) for chronic follow up.   The patient indicates understanding of these issues and agrees with the plan.  Gabriel Earing, FNP

## 2023-11-20 ENCOUNTER — Other Ambulatory Visit: Payer: Self-pay | Admitting: Family Medicine

## 2023-11-20 ENCOUNTER — Encounter: Payer: Self-pay | Admitting: Family Medicine

## 2023-11-20 DIAGNOSIS — K219 Gastro-esophageal reflux disease without esophagitis: Secondary | ICD-10-CM

## 2023-11-20 LAB — CBC WITH DIFFERENTIAL/PLATELET
Basophils Absolute: 0.1 10*3/uL (ref 0.0–0.2)
Basos: 1 %
EOS (ABSOLUTE): 0.2 10*3/uL (ref 0.0–0.4)
Eos: 2 %
Hematocrit: 44.4 % (ref 37.5–51.0)
Hemoglobin: 14.9 g/dL (ref 13.0–17.7)
Immature Grans (Abs): 0.1 10*3/uL (ref 0.0–0.1)
Immature Granulocytes: 1 %
Lymphocytes Absolute: 2.5 10*3/uL (ref 0.7–3.1)
Lymphs: 32 %
MCH: 29.2 pg (ref 26.6–33.0)
MCHC: 33.6 g/dL (ref 31.5–35.7)
MCV: 87 fL (ref 79–97)
Monocytes Absolute: 0.8 10*3/uL (ref 0.1–0.9)
Monocytes: 10 %
Neutrophils Absolute: 4.2 10*3/uL (ref 1.4–7.0)
Neutrophils: 54 %
Platelets: 265 10*3/uL (ref 150–450)
RBC: 5.11 x10E6/uL (ref 4.14–5.80)
RDW: 12.3 % (ref 11.6–15.4)
WBC: 7.9 10*3/uL (ref 3.4–10.8)

## 2023-11-20 LAB — CMP14+EGFR
ALT: 25 IU/L (ref 0–44)
AST: 22 IU/L (ref 0–40)
Albumin: 4.2 g/dL (ref 3.9–4.9)
Alkaline Phosphatase: 60 IU/L (ref 44–121)
BUN/Creatinine Ratio: 24 (ref 10–24)
BUN: 20 mg/dL (ref 8–27)
Bilirubin Total: 0.2 mg/dL (ref 0.0–1.2)
CO2: 20 mmol/L (ref 20–29)
Calcium: 9 mg/dL (ref 8.6–10.2)
Chloride: 106 mmol/L (ref 96–106)
Creatinine, Ser: 0.84 mg/dL (ref 0.76–1.27)
Globulin, Total: 2.1 g/dL (ref 1.5–4.5)
Glucose: 92 mg/dL (ref 70–99)
Potassium: 4.1 mmol/L (ref 3.5–5.2)
Sodium: 142 mmol/L (ref 134–144)
Total Protein: 6.3 g/dL (ref 6.0–8.5)
eGFR: 95 mL/min/{1.73_m2} (ref >59)

## 2023-11-20 LAB — LIPID PANEL
Cholesterol, Total: 156 mg/dL (ref 100–199)
HDL: 45 mg/dL (ref >39)
LDL CALC COMMENT:: 3.5 ratio (ref 0.0–5.0)
LDL Chol Calc (NIH): 78 mg/dL (ref 0–99)
Triglycerides: 195 mg/dL — ABNORMAL HIGH (ref 0–149)
VLDL Cholesterol Cal: 33 mg/dL (ref 5–40)

## 2023-11-20 LAB — TSH: TSH: 1.49 u[IU]/mL (ref 0.450–4.500)

## 2023-12-05 ENCOUNTER — Other Ambulatory Visit: Payer: Self-pay | Admitting: Family Medicine

## 2023-12-05 DIAGNOSIS — E785 Hyperlipidemia, unspecified: Secondary | ICD-10-CM

## 2023-12-08 DIAGNOSIS — G5602 Carpal tunnel syndrome, left upper limb: Secondary | ICD-10-CM | POA: Diagnosis not present

## 2023-12-08 DIAGNOSIS — G5622 Lesion of ulnar nerve, left upper limb: Secondary | ICD-10-CM | POA: Diagnosis not present

## 2024-01-06 HISTORY — PX: ELBOW SURGERY: SHX618

## 2024-01-06 HISTORY — PX: OTHER SURGICAL HISTORY: SHX169

## 2024-01-15 ENCOUNTER — Other Ambulatory Visit: Payer: Self-pay | Admitting: Family Medicine

## 2024-01-15 DIAGNOSIS — G8929 Other chronic pain: Secondary | ICD-10-CM

## 2024-01-15 DIAGNOSIS — M25522 Pain in left elbow: Secondary | ICD-10-CM

## 2024-01-15 DIAGNOSIS — R0602 Shortness of breath: Secondary | ICD-10-CM

## 2024-01-17 ENCOUNTER — Other Ambulatory Visit: Payer: Self-pay | Admitting: Family Medicine

## 2024-01-17 DIAGNOSIS — L219 Seborrheic dermatitis, unspecified: Secondary | ICD-10-CM

## 2024-02-09 ENCOUNTER — Other Ambulatory Visit: Payer: Self-pay | Admitting: Family Medicine

## 2024-02-11 ENCOUNTER — Other Ambulatory Visit: Payer: Self-pay | Admitting: *Deleted

## 2024-02-11 DIAGNOSIS — L219 Seborrheic dermatitis, unspecified: Secondary | ICD-10-CM

## 2024-02-11 MED ORDER — KETOCONAZOLE 2 % EX SHAM: MEDICATED_SHAMPOO | CUTANEOUS | 2 refills | Status: DC

## 2024-02-12 ENCOUNTER — Encounter

## 2024-02-12 NOTE — Progress Notes (Signed)
 Err   This encounter was created in error - please disregard.

## 2024-03-09 DIAGNOSIS — D225 Melanocytic nevi of trunk: Secondary | ICD-10-CM | POA: Diagnosis not present

## 2024-03-09 DIAGNOSIS — L814 Other melanin hyperpigmentation: Secondary | ICD-10-CM | POA: Diagnosis not present

## 2024-03-09 DIAGNOSIS — D485 Neoplasm of uncertain behavior of skin: Secondary | ICD-10-CM | POA: Diagnosis not present

## 2024-03-09 DIAGNOSIS — L578 Other skin changes due to chronic exposure to nonionizing radiation: Secondary | ICD-10-CM | POA: Diagnosis not present

## 2024-03-09 DIAGNOSIS — L219 Seborrheic dermatitis, unspecified: Secondary | ICD-10-CM | POA: Diagnosis not present

## 2024-03-09 DIAGNOSIS — L570 Actinic keratosis: Secondary | ICD-10-CM | POA: Diagnosis not present

## 2024-03-14 ENCOUNTER — Other Ambulatory Visit: Payer: Self-pay | Admitting: Family Medicine

## 2024-03-14 DIAGNOSIS — R0602 Shortness of breath: Secondary | ICD-10-CM

## 2024-04-05 LAB — IFOBT (OCCULT BLOOD): IFOBT: NEGATIVE

## 2024-04-08 ENCOUNTER — Ambulatory Visit

## 2024-04-08 VITALS — BP 113/72 | HR 78 | Ht 69.0 in | Wt 207.0 lb

## 2024-04-08 DIAGNOSIS — Z0000 Encounter for general adult medical examination without abnormal findings: Secondary | ICD-10-CM

## 2024-04-08 NOTE — Patient Instructions (Signed)
 James Davenport , Thank you for taking time out of your busy schedule to complete your Annual Wellness Visit with me. I enjoyed our conversation and look forward to speaking with you again next year. I, as well as your care team,  appreciate your ongoing commitment to your health goals. Please review the following plan we discussed and let me know if I can assist you in the future. Your Game plan/ To Do List   Follow up Visits: Next Medicare AWV with our clinical staff: 04/12/25 at 10:40a.m.    Next Office Visit with your provider: 05/20/24 at 8:00a.m.  Clinician Recommendations:  Aim for 30 minutes of exercise or brisk walking, 6-8 glasses of water, and 5 servings of fruits and vegetables each day.       This is a list of the screening recommended for you and due dates:  Health Maintenance  Topic Date Due   Zoster (Shingles) Vaccine (1 of 2) Never done   Medicare Annual Wellness Visit  10/22/2023   COVID-19 Vaccine (1 - 2024-25 season) 08/26/2024*   Pneumococcal Vaccine for age over 27 (1 of 1 - PCV) 11/18/2024*   Flu Shot  05/07/2024   Cologuard (Stool DNA test)  06/25/2025   DTaP/Tdap/Td vaccine (2 - Td or Tdap) 10/07/2026   Hepatitis C Screening  Completed   Hepatitis B Vaccine  Aged Out   HPV Vaccine  Aged Out   Meningitis B Vaccine  Aged Out   Colon Cancer Screening  Discontinued  *Topic was postponed. The date shown is not the original due date.    Advanced directives: (Declined) Advance directive discussed with you today. Even though you declined this today, please call our office should you change your mind, and we can give you the proper paperwork for you to fill out. Advance Care Planning is important because it:  [x]  Makes sure you receive the medical care that is consistent with your values, goals, and preferences  [x]  It provides guidance to your family and loved ones and reduces their decisional burden about whether or not they are making the right decisions based on your  wishes.  Follow the link provided in your after visit summary or read over the paperwork we have mailed to you to help you started getting your Advance Directives in place. If you need assistance in completing these, please reach out to us  so that we can help you!  See attachments for Preventive Care and Fall Prevention Tips.

## 2024-04-08 NOTE — Progress Notes (Signed)
 Subjective:   James Davenport is a 68 y.o. who presents for a Medicare Wellness preventive visit.  As a reminder, Annual Wellness Visits don't include a physical exam, and some assessments may be limited, especially if this visit is performed virtually. We may recommend an in-person follow-up visit with your provider if needed.  Visit Complete: Virtual I connected with  James Davenport on 04/08/24 by a audio enabled telemedicine application and verified that I am speaking with the correct person using two identifiers.  Patient Location: Home  Provider Location: Home Office  I discussed the limitations of evaluation and management by telemedicine. The patient expressed understanding and agreed to proceed.  Vital Signs: Because this visit was a virtual/telehealth visit, some criteria may be missing or patient reported. Any vitals not documented were not able to be obtained and vitals that have been documented are patient reported.  VideoDeclined- This patient declined Librarian, academic. Therefore the visit was completed with audio only.  Persons Participating in Visit: Patient.  AWV Questionnaire: No: Patient Medicare AWV questionnaire was not completed prior to this visit.  Cardiac Risk Factors include: advanced age (>66men, >32 women);dyslipidemia;obesity (BMI >30kg/m2);male gender     Objective:    Today's Vitals   04/08/24 1044  BP: 113/72  Pulse: 78  Weight: 207 lb (93.9 kg)  Height: 5' 9 (1.753 m)  PainSc: 3    Body mass index is 30.57 kg/m.     04/08/2024   10:50 AM 07/31/2023    4:12 PM 02/12/2023    8:30 AM 10/21/2022   10:21 AM 09/18/2022    1:55 PM 07/12/2021    9:11 AM  Advanced Directives  Does Patient Have a Medical Advance Directive? No No No No No No  Would patient like information on creating a medical advance directive?    No - Patient declined  No - Patient declined    Current Medications (verified) Outpatient  Encounter Medications as of 04/08/2024  Medication Sig   acetaminophen  (TYLENOL ) 500 MG tablet Take 500 mg by mouth every 6 (six) hours as needed.   albuterol  (VENTOLIN  HFA) 108 (90 Base) MCG/ACT inhaler INHALE 2 PUFFS EVERY 6 HOURS AS NEEDED   aspirin 81 MG chewable tablet Chew 1 tablet by mouth daily at 2 PM.   Bempedoic Acid-Ezetimibe  (NEXLIZET ) 180-10 MG TABS TAKE 1 TABLET EVERY DAY   cyclobenzaprine  (FLEXERIL ) 10 MG tablet TAKE 1 TABLET EVERY DAY AS NEEDED FOR MUSCLE SPASM(S)   EPINEPHRINE  0.3 mg/0.3 mL IJ SOAJ injection INJECT 0.3 MG INTO THE MUSCLE AS NEEDED FOR ANAPHYLAXIS.   fluticasone  (FLONASE ) 50 MCG/ACT nasal spray USE 2 SPRAYS IN EACH NOSTRIL EVERY DAY   gabapentin  (NEURONTIN ) 300 MG capsule Take 2 capsules (600 mg total) by mouth 2 (two) times daily.   ketoconazole  (NIZORAL ) 2 % cream Apply 1 application topically daily.   ketoconazole  (NIZORAL ) 2 % shampoo APPLY TOPICALLY 2 TIMES A WEEK. LEAVE ON FOR 3 TO 5 MINUTES BEFORE RINSING.   levocetirizine (XYZAL ) 5 MG tablet TAKE 1 TABLET EVERY EVENING   meloxicam  (MOBIC ) 15 MG tablet TAKE 1 TABLET EVERY DAY   pantoprazole  (PROTONIX ) 40 MG tablet TAKE 1 TABLET (40 MG TOTAL) BY MOUTH 2 (TWO) TIMES DAILY BEFORE A MEAL.   SPIRIVA  RESPIMAT 1.25 MCG/ACT AERS INHALE 2 PUFFS ONE TIME DAILY   valsartan  (DIOVAN ) 80 MG tablet TAKE ONE TABLET BY MOUTH DAILY   predniSONE  (STERAPRED UNI-PAK 21 TAB) 10 MG (21) TBPK tablet Use as directed on back  of pill pack (Patient not taking: Reported on 04/08/2024)   No facility-administered encounter medications on file as of 04/08/2024.    Allergies (verified) Statins and Tramadol   History: Past Medical History:  Diagnosis Date   AAA (abdominal aortic aneurysm) without rupture (HCC) 09/19/2021   Allergy    Aortic atherosclerosis (HCC)    Arthritis 11/24/2020   Bilateral carpal tunnel syndrome 11/24/2020   Chronic left shoulder pain 11/24/2020   GERD (gastroesophageal reflux disease) 11/24/2020    Hyperlipidemia LDL goal <100 01/31/2016   Hypertension    Leg cramps 11/24/2020   Ulcer    Past Surgical History:  Procedure Laterality Date   carple tunnel Left 01/2024   ELBOW SURGERY  01/2024   LEG SURGERY Right 1975   Family History  Problem Relation Age of Onset   Lung disease Mother    Hypertension Mother    Hearing loss Father    Asthma Daughter    Social History   Socioeconomic History   Marital status: Single    Spouse name: Not on file   Number of children: Not on file   Years of education: Not on file   Highest education level: Some college, no degree  Occupational History   Not on file  Tobacco Use   Smoking status: Former    Current packs/day: 0.00    Types: Cigarettes    Quit date: 1990    Years since quitting: 35.5   Smokeless tobacco: Never  Vaping Use   Vaping status: Never Used  Substance and Sexual Activity   Alcohol use: Yes    Comment: occ   Drug use: Never   Sexual activity: Not on file  Other Topics Concern   Not on file  Social History Narrative   Not on file   Social Drivers of Health   Financial Resource Strain: Low Risk  (04/08/2024)   Overall Financial Resource Strain (CARDIA)    Difficulty of Paying Living Expenses: Not hard at all  Food Insecurity: No Food Insecurity (04/08/2024)   Hunger Vital Sign    Worried About Running Out of Food in the Last Year: Never true    Ran Out of Food in the Last Year: Never true  Transportation Needs: No Transportation Needs (04/08/2024)   PRAPARE - Administrator, Civil Service (Medical): No    Lack of Transportation (Non-Medical): No  Physical Activity: Insufficiently Active (04/08/2024)   Exercise Vital Sign    Days of Exercise per Week: 7 days    Minutes of Exercise per Session: 20 min  Stress: No Stress Concern Present (04/08/2024)   [employer] of Occupational Health - Occupational Stress Questionnaire    Feeling of Stress: Not at all  Social Connections: Moderately  Integrated (04/08/2024)   Social Connection and Isolation Panel    Frequency of Communication with Friends and Family: Twice a week    Frequency of Social Gatherings with Friends and Family: Twice a week    Attends Religious Services: Never    Database administrator or Organizations: Yes    Attends Engineer, structural: More than 4 times per year    Marital Status: Married    Tobacco Counseling Counseling given: Yes    Clinical Intake:  Pre-visit preparation completed: Yes  Pain : 0-10 (L-back leg) Pain Score: 3  Pain Type: Chronic pain Pain Location: Leg Pain Orientation: Left Pain Descriptors / Indicators: Burning, Constant Pain Onset: More than a month ago Pain Frequency: Constant Pain  Relieving Factors: Rx med  Pain Relieving Factors: Rx med  BMI - recorded: 30.57 Nutritional Status: BMI > 30  Obese Nutritional Risks: None Diabetes: No  Lab Results  Component Value Date   HGBA1C 5.9 (H) 11/19/2023     How often do you need to have someone help you when you read instructions, pamphlets, or other written materials from your doctor or pharmacy?: 1 - Never  Interpreter Needed?: No  Comments: During awv pt stated that about 2weeks ago pt been experiencing some vertigo/dizzy issues off/on ie every 2-3 days. Suggest pt talk to pcp at his next visit or call to make an appt to get it check. Pt agrees. Information entered by :: Alia t/cma   Activities of Daily Living     04/08/2024   10:49 AM  In your present state of health, do you have any difficulty performing the following activities:  Hearing? 0  Vision? 0  Difficulty concentrating or making decisions? 0  Walking or climbing stairs? 0  Dressing or bathing? 0  Doing errands, shopping? 0  Preparing Food and eating ? N  Using the Toilet? N  In the past six months, have you accidently leaked urine? N  Do you have problems with loss of bowel control? N  Managing your Medications? N  Managing your  Finances? N  Housekeeping or managing your Housekeeping? N    Patient Care Team: Joesph Annabella HERO, FNP as PCP - General (Family Medicine) Shaaron Lamar HERO, MD as Consulting Physician (Gastroenterology) Joesph Annabella HERO, FNP (Family Medicine)  I have updated your Care Teams any recent Medical Services you may have received from other providers in the past year.     Assessment:   This is a routine wellness examination for Pershing.  Hearing/Vision screen Hearing Screening - Comments:: Pt denies hearing dif Vision Screening - Comments:: Pt wear glasses/pt goes [facility]. Lum Madden  ov a yr   Goals Addressed             This Visit's Progress    Client will report no fall or injuries in the next 12 months.   On track      Depression Screen     04/08/2024   10:54 AM 11/19/2023    8:22 AM 08/11/2023    8:02 AM 05/16/2023    9:43 AM 05/02/2023    9:39 AM 02/05/2023    7:58 AM 11/04/2022    9:14 AM  PHQ 2/9 Scores  PHQ - 2 Score 0 0   0  0  PHQ- 9 Score  0   0  0  Exception Documentation   Patient refusal Patient refusal  Patient refusal     Fall Risk     04/08/2024   10:38 AM 11/19/2023    8:22 AM 08/11/2023    8:02 AM 05/16/2023    9:42 AM 05/02/2023    9:39 AM  Fall Risk   Falls in the past year? 0 0 0 0 0  Number falls in past yr: 0      Injury with Fall? 0      Risk for fall due to : No Fall Risks      Follow up Falls evaluation completed        MEDICARE RISK AT HOME:  Medicare Risk at Home Any stairs in or around the home?: Yes If so, are there any without handrails?: Yes Home free of loose throw rugs in walkways, pet beds, electrical cords, etc?: Yes Adequate  lighting in your home to reduce risk of falls?: Yes Life alert?: No Use of a cane, walker or w/c?: No Grab bars in the bathroom?: No Shower chair or bench in shower?: No Elevated toilet seat or a handicapped toilet?: Yes  TIMED UP AND GO:  Was the test performed?  no  Cognitive Function: 6CIT  completed    07/12/2021    9:13 AM  MMSE - Mini Mental State Exam  Orientation to time 5  Orientation to Place 5  Registration 3  Attention/ Calculation 5  Recall 1  Language- name 2 objects 2  Language- repeat 1  Language- follow 3 step command 3  Language- read & follow direction 1  Write a sentence 1  Copy design 1  Total score 28        04/08/2024   10:55 AM 10/21/2022   10:21 AM  6CIT Screen  What Year? 0 points 0 points  What month? 0 points 0 points  What time? 0 points 0 points  Count back from 20 0 points 0 points  Months in reverse 0 points 0 points  Repeat phrase 0 points 0 points  Total Score 0 points 0 points    Immunizations Immunization History  Administered Date(s) Administered   Tdap 10/07/2016    Screening Tests Health Maintenance  Topic Date Due   Zoster Vaccines- Shingrix (1 of 2) Never done   COVID-19 Vaccine (1 - 2024-25 season) 08/26/2024 (Originally 06/08/2023)   Pneumococcal Vaccine: 50+ Years (1 of 1 - PCV) 11/18/2024 (Originally 09/04/2005)   INFLUENZA VACCINE  05/07/2024   Medicare Annual Wellness (AWV)  04/08/2025   Fecal DNA (Cologuard)  06/25/2025   DTaP/Tdap/Td (2 - Td or Tdap) 10/07/2026   Hepatitis C Screening  Completed   Hepatitis B Vaccines  Aged Out   HPV VACCINES  Aged Out   Meningococcal B Vaccine  Aged Out   Colonoscopy  Discontinued    Health Maintenance  Health Maintenance Due  Topic Date Due   Zoster Vaccines- Shingrix (1 of 2) Never done   Health Maintenance Items Addressed: See Nurse Notes at the end of this note  Additional Screening:  Vision Screening: Recommended annual ophthalmology exams for early detection of glaucoma and other disorders of the eye. Would you like a referral to an eye doctor? No    Dental Screening: Recommended annual dental exams for proper oral hygiene  Community Resource Referral / Chronic Care Management: CRR required this visit?  No   CCM required this visit?  No   Plan:     I have personally reviewed and noted the following in the patient's chart:   Medical and social history Use of alcohol, tobacco or illicit drugs  Current medications and supplements including opioid prescriptions. Patient is not currently taking opioid prescriptions. Functional ability and status Nutritional status Physical activity Advanced directives List of other physicians Hospitalizations, surgeries, and ER visits in previous 12 months Vitals Screenings to include cognitive, depression, and falls Referrals and appointments  In addition, I have reviewed and discussed with patient certain preventive protocols, quality metrics, and best practice recommendations. A written personalized care plan for preventive services as well as general preventive health recommendations were provided to patient.   Ozie Ned, CMA   04/08/2024   After Visit Summary: (MyChart) Due to this being a telephonic visit, the after visit summary with patients personalized plan was offered to patient via MyChart   Notes: Please refer to Routing Comments.

## 2024-04-14 ENCOUNTER — Other Ambulatory Visit: Payer: Self-pay | Admitting: Family Medicine

## 2024-04-14 DIAGNOSIS — K219 Gastro-esophageal reflux disease without esophagitis: Secondary | ICD-10-CM

## 2024-04-19 ENCOUNTER — Other Ambulatory Visit: Payer: Self-pay | Admitting: Family Medicine

## 2024-04-19 DIAGNOSIS — L219 Seborrheic dermatitis, unspecified: Secondary | ICD-10-CM

## 2024-04-29 ENCOUNTER — Other Ambulatory Visit: Payer: Self-pay | Admitting: Family Medicine

## 2024-04-29 DIAGNOSIS — E785 Hyperlipidemia, unspecified: Secondary | ICD-10-CM

## 2024-05-12 ENCOUNTER — Other Ambulatory Visit: Payer: Self-pay | Admitting: Family Medicine

## 2024-05-12 DIAGNOSIS — L219 Seborrheic dermatitis, unspecified: Secondary | ICD-10-CM

## 2024-05-20 ENCOUNTER — Ambulatory Visit (INDEPENDENT_AMBULATORY_CARE_PROVIDER_SITE_OTHER): Payer: Medicare HMO | Admitting: Family Medicine

## 2024-05-20 ENCOUNTER — Encounter: Payer: Self-pay | Admitting: Family Medicine

## 2024-05-20 VITALS — BP 121/75 | HR 79 | Temp 98.4°F | Ht 69.0 in | Wt 211.8 lb

## 2024-05-20 DIAGNOSIS — I700 Atherosclerosis of aorta: Secondary | ICD-10-CM

## 2024-05-20 DIAGNOSIS — Z789 Other specified health status: Secondary | ICD-10-CM | POA: Diagnosis not present

## 2024-05-20 DIAGNOSIS — E6609 Other obesity due to excess calories: Secondary | ICD-10-CM

## 2024-05-20 DIAGNOSIS — K219 Gastro-esophageal reflux disease without esophagitis: Secondary | ICD-10-CM | POA: Diagnosis not present

## 2024-05-20 DIAGNOSIS — H6692 Otitis media, unspecified, left ear: Secondary | ICD-10-CM | POA: Diagnosis not present

## 2024-05-20 DIAGNOSIS — M5416 Radiculopathy, lumbar region: Secondary | ICD-10-CM

## 2024-05-20 DIAGNOSIS — E66811 Obesity, class 1: Secondary | ICD-10-CM

## 2024-05-20 DIAGNOSIS — I10 Essential (primary) hypertension: Secondary | ICD-10-CM | POA: Diagnosis not present

## 2024-05-20 DIAGNOSIS — E785 Hyperlipidemia, unspecified: Secondary | ICD-10-CM | POA: Diagnosis not present

## 2024-05-20 DIAGNOSIS — Z6830 Body mass index (BMI) 30.0-30.9, adult: Secondary | ICD-10-CM

## 2024-05-20 MED ORDER — AMOXICILLIN 875 MG PO TABS: 875.0000 mg | ORAL_TABLET | Freq: Two times a day (BID) | ORAL | 0 refills | Status: AC

## 2024-05-20 NOTE — Progress Notes (Signed)
 Established Patient Office Visit  Subjective   [name], male    DOB: 1955-12-30  Age: 68 y.o. MRN: [id number]  Chief Complaint  Patient presents with   Medical Management of Chronic Issues    HPI  HTN Complaint with meds - Yes Current Medications - valsartan  Pertinent ROS:  Headache - No Fatigue - No Visual Disturbances - No Chest pain - No Dyspnea - No Palpitations - No LE edema - No  2. HLD Compliant with nexlizet . No side effects.   3. GERD Compliant with medications - Yes Current medications - protonix  mg BID  Sore throat - No Voice change - No Hemoptysis - No Dysphagia or dyspepsia - No Water brash - No Red Flags (weight loss, hematochezia, melena, weight loss, early satiety, fevers, odynophagia, or persistent vomiting) - No  4. Leg pain Along lateral side of left leg. Sharp, shooting pain. Would like to go back to PT as this has been very beneficial in the past. Uses a ace bandage around leg and this helps. Also taking tylenol  prn.   5. Ears Getting congested, popping intermittently with muffled hearing. Some pain. No drainage or fever.    Past Medical History:  Diagnosis Date   AAA (abdominal aortic aneurysm) without rupture (HCC) 09/19/2021   Allergy    Aortic atherosclerosis (HCC)    Arthritis 11/24/2020   Bilateral carpal tunnel syndrome 11/24/2020   Chronic left shoulder pain 11/24/2020   GERD (gastroesophageal reflux disease) 11/24/2020   Hyperlipidemia LDL goal <100 01/31/2016   Hypertension    Leg cramps 11/24/2020   Ulcer       ROS As per HPI.    Objective:     BP 121/75   Pulse 79   Temp 98.4 F (36.9 C) (Temporal)   Ht 5' 9 (1.753 m)   Wt 211 lb 12.8 oz (96.1 kg)   SpO2 94%   BMI 31.28 kg/m   BP Readings from Last 3 Encounters:  05/20/24 121/75  04/08/24 113/72  11/19/23 113/72   Wt Readings from Last 3 Encounters:  05/20/24 211 lb 12.8 oz (96.1 kg)  04/08/24 207 lb (93.9 kg)  11/19/23 207  lb (93.9 kg)     Physical Exam Vitals and nursing note reviewed.  Constitutional:      General: He is not in acute distress.    Appearance: He is obese. He is not ill-appearing, toxic-appearing or diaphoretic.  HENT:     Right Ear: Tympanic membrane, ear canal and external ear normal.     Left Ear: Ear canal and external ear normal. Tympanic membrane is bulging (opaque TM).  Cardiovascular:     Rate and Rhythm: Normal rate and regular rhythm.     Pulses: Normal pulses.     Heart sounds: Normal heart sounds. No murmur heard. Pulmonary:     Effort: Pulmonary effort is normal. No respiratory distress.     Breath sounds: Normal breath sounds.  Musculoskeletal:     Cervical back: Neck supple. No tenderness.     Right lower leg: No edema.     Left lower leg: No edema.  Lymphadenopathy:     Cervical: No cervical adenopathy.  Skin:    General: Skin is warm and dry.  Neurological:     General: No focal deficit present.     Mental Status: He is alert and oriented to person, place, and time.  Psychiatric:        Mood and Affect: Mood normal.  Behavior: Behavior normal.        Thought Content: Thought content normal.        Judgment: Judgment normal.      No results found for any visits on 05/20/24.    The 10-year ASCVD risk score (Arnett DK, et al., 2019) is: 15.2%    Assessment & Plan:   James Davenport was seen today for medical management of chronic issues.  Diagnoses and all orders for this visit:  Primary hypertension Well controlled on current regimen.   Hyperlipidemia LDL goal <100 Statin intolerance Last LDL at goal. Continue nexlizet .   Aortic atherosclerosis (HCC) Continue aspirin.   Gastroesophageal reflux disease without esophagitis Continue PPI.   Class 1 obesity due to excess calories with serious comorbidity and body mass index (BMI) of 30.0 to 30.9 in adult Diet, exercise, weight loss.   Acute left otitis media Amoxicillin  as below.  Return to office for new or worsening symptoms, or if symptoms persist.  -     amoxicillin  (AMOXIL ) 875 MG tablet; Take 1 tablet (875 mg total) by mouth 2 (two) times daily for 7 days.  Lumbar radiculopathy No red flags. Referral back to PT. -     Ambulatory referral to Physical Therapy  Return in about 6 months (around 11/20/2024) for CPE with fasting labs.   The patient indicates understanding of these issues and agrees with the plan.  James CHRISTELLA Search, FNP

## 2024-05-31 ENCOUNTER — Ambulatory Visit: Attending: Family Medicine

## 2024-05-31 ENCOUNTER — Encounter: Payer: Self-pay | Admitting: Family Medicine

## 2024-05-31 DIAGNOSIS — M25552 Pain in left hip: Secondary | ICD-10-CM | POA: Insufficient documentation

## 2024-05-31 DIAGNOSIS — M79652 Pain in left thigh: Secondary | ICD-10-CM | POA: Diagnosis not present

## 2024-05-31 DIAGNOSIS — M5416 Radiculopathy, lumbar region: Secondary | ICD-10-CM | POA: Diagnosis not present

## 2024-05-31 NOTE — Therapy (Signed)
 OUTPATIENT PHYSICAL THERAPY THORACOLUMBAR EVALUATION   Patient Name: James Davenport MRN: [id number] DOB:1956/09/20, 68 y.o., male Today's Date: 05/31/2024  END OF SESSION:  PT End of Session - 05/31/24 0807     Visit Number 1    Number of Visits 12    Date for PT Re-Evaluation 08/06/24    PT Start Time 0801    PT Stop Time 0846    PT Time Calculation (min) 45 min    Activity Tolerance Patient tolerated treatment well    Behavior During Therapy [facility] for tasks assessed/performed          Past Medical History:  Diagnosis Date   AAA (abdominal aortic aneurysm) without rupture (HCC) 09/19/2021   Allergy    Aortic atherosclerosis (HCC)    Arthritis 11/24/2020   Bilateral carpal tunnel syndrome 11/24/2020   Chronic left shoulder pain 11/24/2020   GERD (gastroesophageal reflux disease) 11/24/2020   Hyperlipidemia LDL goal <100 01/31/2016   Hypertension    Leg cramps 11/24/2020   Ulcer    Past Surgical History:  Procedure Laterality Date   carple tunnel Left 01/2024   ELBOW SURGERY  01/2024   LEG SURGERY Right 1975   Patient Active Problem List   Diagnosis Date Noted   Class 1 obesity due to excess calories with serious comorbidity and body mass index (BMI) of 30.0 to 30.9 in adult 11/19/2023   Primary hypertension 08/11/2023   Statin intolerance 02/05/2023   Chronic midline low back pain with left-sided sciatica 02/05/2023   Allergic reaction to plant, excluding food 02/05/2023   Bee sting allergy 01/23/2022   AAA (abdominal aortic aneurysm) without rupture (HCC) 09/19/2021   Shortness of breath 08/13/2021   Seasonal allergies 01/09/2021   Seborrheic dermatitis 01/09/2021   History of colon polyps 01/09/2021   Aortic atherosclerosis (HCC)    GERD (gastroesophageal reflux disease) 11/24/2020   Arthritis 11/24/2020   Leg cramps 11/24/2020   Bilateral carpal tunnel syndrome 11/24/2020   Chronic left shoulder pain 11/24/2020   Hyperlipidemia LDL goal <100  01/31/2016   REFERRING PROVIDER: Joesph Annabella HERO, FNP   REFERRING DIAG: Lumbar radiculopathy   Rationale for Evaluation and Treatment: Rehabilitation  THERAPY DIAG:  Radiculopathy, lumbar region - Plan: PT plan of care cert/re-cert  ONSET DATE: 3 weeks ago  SUBJECTIVE:                                                                                                                                                                                           SUBJECTIVE STATEMENT: Patient reports that he had some back and left lateral leg pain that began about 3 weeks  ago. He notes that it felt like barbed wire running down the side of his leg and the only thing that helped was wrapping his leg with an ace wrap. However, it is not as frequent or severe in the past 5 days. He is unable to identify anything that could have caused this pain.   PERTINENT HISTORY:  Hypertension, abdominal aortic aneurysm, arthritis, and allergies  PAIN:  Are you having pain? Yes: NPRS scale: Current: 3/10 Best: 0/10 Worst: 10/10 Pain location: left hip and lateral leg  Pain description: stabbing Aggravating factors: sitting on a wooden chair (10 minutes at most)  Relieving factors: wrapping his leg  PRECAUTIONS: None  RED FLAGS: Abdominal aneurysm: Yes: as noted in his medical record   WEIGHT BEARING RESTRICTIONS: No  FALLS:  Has patient fallen in last 6 months? No  LIVING ENVIRONMENT: Lives with: lives with an adult companion Lives in: House/apartment Stairs: Yes: External: 6 steps; can reach both; reciprocal pattern Has following equipment at home: None  OCCUPATION: retired  PLOF: Independent  PATIENT GOALS: reduced pain, and improved strength  NEXT MD VISIT: 11/23/23  OBJECTIVE:  Note: Objective measures were completed at Evaluation unless otherwise noted.  PATIENT SURVEYS:  Modified Oswestry:  MODIFIED OSWESTRY DISABILITY SCALE  Date: 05/31/24 Score  Pain intensity 3 =  Pain  medication provides me with moderate relief from pain.  2. Personal care (washing, dressing, etc.) 2 =  It is painful to take care of myself, and I am slow and careful.  3. Lifting 3 = Pain prevents me from lifting heavy weights, but I can manage (5) I have hardly any social life because of my pain. light to medium weights if they are conveniently positioned  4. Walking 3 =  Pain prevents me from walking more than  mile.  5. Sitting 3 =  Pain prevents me from sitting more than  hour.  6. Standing 4 =  Pain prevents me from standing more than 10 minutes.  7. Sleeping 3 =  Even when I take pain medication, I sleep less than 4 hours.  8. Social Life 3 =  Pain prevents me from going out very often.  9. Traveling 4 = My pain restricts my travel to short necessary journeys under 1/2 hour.  10. Employment/ Homemaking 2 = I can perform most of my homemaking/job duties, but pain prevents me from performing more physically stressful activities (eg, lifting, vacuuming).  Total 30/50   Interpretation of scores: Score Category Description  0-20% Minimal Disability The patient can cope with most living activities. Usually no treatment is indicated apart from advice on lifting, sitting and exercise  21-40% Moderate Disability The patient experiences more pain and difficulty with sitting, lifting and standing. Travel and social life are more difficult and they may be disabled from work. Personal care, sexual activity and sleeping are not grossly affected, and the patient can usually be managed by conservative means  41-60% Severe Disability Pain remains the main problem in this group, but activities of daily living are affected. These patients require a detailed investigation  61-80% Crippled Back pain impinges on all aspects of the patient's life. Positive intervention is required  81-100% Bed-bound  These patients are either bed-bound or exaggerating their symptoms  Bluford FORBES Zoe DELENA Karon DELENA, et al.  Surgery versus conservative management of stable thoracolumbar fracture: the PRESTO feasibility RCT. [place] ([place]): [employer]; 2021 Nov. [facility] Technology Assessment, No. 25.62.) Appendix 3, Oswestry Disability Index category descriptors. Available  from: FindJewel[employer].cz  Minimally Clinically Important Difference (MCID) = 12.8%  COGNITION: Overall cognitive status: Within functional limits for tasks assessed     SENSATION: Patient reports minimal tingling at left IT band  POSTURE: forward head  PALPATION: TTP: bilateral lumbar paraspinals (L>R), TFL, IT band, hamstrings, and gastrocnemius  LUMBAR JOINT MOBILITY:  L1-4: WFL and nonpainful   L4-S1: hypomobile and painful  LUMBAR ROM:   AROM eval  Flexion 30; uncomfortable   Extension 24  Right lateral flexion WFL   Left lateral flexion WFL   Right rotation 25% limited; familiar LLE pain  Left rotation WFL   (Blank rows = not tested)  LOWER EXTREMITY ROM: WFL for activities assessed  LOWER EXTREMITY MMT:    MMT Right eval Left eval  Hip flexion 4+/5 4/5  Hip extension    Hip abduction    Hip adduction    Hip internal rotation    Hip external rotation    Knee flexion 4+/5 4-/5; familiar LLE pain  Knee extension 5/5 5/5; tight feeling in left low back  Ankle dorsiflexion 4/5 4/5; familiar LLE pain  Ankle plantarflexion    Ankle inversion    Ankle eversion     (Blank rows = not tested)  GAIT: Assistive device utilized: None Level of assistance: Complete Independence   TREATMENT DATE:                                                                                                                                                                05/31/24  EXERCISE LOG  Exercise Repetitions and Resistance Comments  Hamstring stretch  3 x 30 second hold    Hamstring set  10 reps w/ 5 second hold                 Blank cell = exercise not performed today    PATIENT  EDUCATION:  Education details: POC, HEP, prognosis, healing, objective findings, and goals for physical therapy Person educated: Patient Education method: Explanation Education comprehension: verbalized understanding  HOME EXERCISE PROGRAM: [id number]  ASSESSMENT:  CLINICAL IMPRESSION: Patient is a 68 y.o. male who was seen today for physical therapy evaluation and treatment for acute left lumbar radiculopathy. He presented with low to moderate pain severity and irritability with right lumbar rotation and left lower extremity manual muscle testing reproducing his familiar symptoms. Recommend that he continue with skilled physical therapy to address his impairments to return to his prior level of function.  OBJECTIVE IMPAIRMENTS: decreased activity tolerance, decreased mobility, decreased ROM, decreased strength, hypomobility, impaired tone, and pain.   ACTIVITY LIMITATIONS: carrying, lifting, sitting, and locomotion level  PARTICIPATION LIMITATIONS: shopping, community activity, and yard work  PERSONAL FACTORS: Past/current experiences and 3+ comorbidities: Hypertension, abdominal aortic aneurysm, arthritis, and allergies are also  affecting patient's functional outcome.   REHAB POTENTIAL: Good  CLINICAL DECISION MAKING: Evolving/moderate complexity  EVALUATION COMPLEXITY: Moderate   GOALS: Goals reviewed with patient? Yes  SHORT TERM GOALS: Target date: 06/21/24  Patient will be independent with his initial HEP.  Baseline: Goal status: INITIAL  2.  Patient will be able to complete his daily activities without his familiar pain exceeding 7/10. Baseline:  Goal status: INITIAL  3.  Patient will improve his ODI score to 46% (23/50) disability or less for improved perceived function with his daily activities.  Baseline:  Goal status: INITIAL  LONG TERM GOALS: Target date: 07/12/24  Patient will be independent with advanced HEP.  Baseline:  Goal status: INITIAL  2.  Patient  will be able to complete his daily activities without his familiar pain exceeding 4/10. Baseline:  Goal status: INITIAL  3.  Patient will improve his ODI score to 30% (15/50) disability or less for improved perceived function with his daily activities.  Baseline:  Goal status: INITIAL  4.  Patient will improve his left hamstring strength to at least 4+/5 for improved function with his daily activities.  Baseline:  Goal status: INITIAL  5.  Patient will improve his lumbar flexion to at least 45 degrees for improved function picking up items from the floor.  Baseline:  Goal status: INITIAL  PLAN:  PT FREQUENCY: 1-2x/week  PT DURATION: 6 weeks  PLANNED INTERVENTIONS: 97164- PT Re-evaluation, 97750- Physical Performance Testing, 97110-Therapeutic exercises, 97530- Therapeutic activity, [id number]- Neuromuscular re-education, 97535- Self Care, 02859- Manual therapy, G0283- Electrical stimulation (unattended), [id number]- Traction (mechanical), 20560 (1-2 muscles), 20561 (3+ muscles)- Dry Needling, Patient/Family education, Balance training, Joint mobilization, Spinal mobilization, Cryotherapy, and Moist heat.  PLAN FOR NEXT SESSION: Nustep, lumbar and lower extremity strengthening, review and update HEP, and modalities as needed   Lacinda JAYSON Fass, PT 05/31/2024, 1:01 PM

## 2024-06-02 ENCOUNTER — Ambulatory Visit: Admitting: Physical Therapy

## 2024-06-02 ENCOUNTER — Encounter: Payer: Self-pay | Admitting: Physical Therapy

## 2024-06-02 DIAGNOSIS — M79652 Pain in left thigh: Secondary | ICD-10-CM | POA: Diagnosis not present

## 2024-06-02 DIAGNOSIS — M25552 Pain in left hip: Secondary | ICD-10-CM | POA: Diagnosis not present

## 2024-06-02 DIAGNOSIS — M5416 Radiculopathy, lumbar region: Secondary | ICD-10-CM

## 2024-06-02 NOTE — Therapy (Signed)
 OUTPATIENT PHYSICAL THERAPY THORACOLUMBAR TREATMENT   Patient Name: Olaoluwa Grieder MRN: [id number] DOB:May 04, 1956, 68 y.o., male Today's Date: 06/02/2024  END OF SESSION:  PT End of Session - 06/02/24 0811     Visit Number 2    Number of Visits 12    Date for PT Re-Evaluation 08/06/24    PT Start Time 0800    PT Stop Time 0857    PT Time Calculation (min) 57 min    Activity Tolerance Patient tolerated treatment well    Behavior During Therapy [facility] for tasks assessed/performed          Past Medical History:  Diagnosis Date   AAA (abdominal aortic aneurysm) without rupture (HCC) 09/19/2021   Allergy    Aortic atherosclerosis (HCC)    Arthritis 11/24/2020   Bilateral carpal tunnel syndrome 11/24/2020   Chronic left shoulder pain 11/24/2020   GERD (gastroesophageal reflux disease) 11/24/2020   Hyperlipidemia LDL goal <100 01/31/2016   Hypertension    Leg cramps 11/24/2020   Ulcer    Past Surgical History:  Procedure Laterality Date   carple tunnel Left 01/2024   ELBOW SURGERY  01/2024   LEG SURGERY Right 1975   Patient Active Problem List   Diagnosis Date Noted   Class 1 obesity due to excess calories with serious comorbidity and body mass index (BMI) of 30.0 to 30.9 in adult 11/19/2023   Primary hypertension 08/11/2023   Statin intolerance 02/05/2023   Chronic midline low back pain with left-sided sciatica 02/05/2023   Allergic reaction to plant, excluding food 02/05/2023   Bee sting allergy 01/23/2022   AAA (abdominal aortic aneurysm) without rupture (HCC) 09/19/2021   Shortness of breath 08/13/2021   Seasonal allergies 01/09/2021   Seborrheic dermatitis 01/09/2021   History of colon polyps 01/09/2021   Aortic atherosclerosis (HCC)    GERD (gastroesophageal reflux disease) 11/24/2020   Arthritis 11/24/2020   Leg cramps 11/24/2020   Bilateral carpal tunnel syndrome 11/24/2020   Chronic left shoulder pain 11/24/2020   Hyperlipidemia LDL goal <100  01/31/2016   REFERRING PROVIDER: Joesph Annabella HERO, FNP   REFERRING DIAG: Lumbar radiculopathy   Rationale for Evaluation and Treatment: Rehabilitation  THERAPY DIAG:  Radiculopathy, lumbar region  Pain in left thigh  Pain in left hip  ONSET DATE: 3 weeks ago  SUBJECTIVE:                                                                                                                                                                                           SUBJECTIVE STATEMENT: Exercises are helping.  PERTINENT HISTORY:  Hypertension, abdominal aortic aneurysm, arthritis, and allergies  PAIN:  Are you having pain? Yes: NPRS scale: Current: 3/10 Best: 0/10 Worst: 10/10 Pain location: left hip and lateral leg  Pain description: stabbing Aggravating factors: sitting on a wooden chair (10 minutes at most)  Relieving factors: wrapping his leg  PRECAUTIONS: None  RED FLAGS: Abdominal aneurysm: Yes: as noted in his medical record   WEIGHT BEARING RESTRICTIONS: No  FALLS:  Has patient fallen in last 6 months? No  LIVING ENVIRONMENT: Lives with: lives with an adult companion Lives in: House/apartment Stairs: Yes: External: 6 steps; can reach both; reciprocal pattern Has following equipment at home: None  OCCUPATION: retired  PLOF: Independent  PATIENT GOALS: reduced pain, and improved strength  NEXT MD VISIT: 11/23/23  OBJECTIVE:  Note: Objective measures were completed at Evaluation unless otherwise noted.  PATIENT SURVEYS:  Modified Oswestry:  MODIFIED OSWESTRY DISABILITY SCALE  Date: 05/31/24 Score  Pain intensity 3 =  Pain medication provides me with moderate relief from pain.  2. Personal care (washing, dressing, etc.) 2 =  It is painful to take care of myself, and I am slow and careful.  3. Lifting 3 = Pain prevents me from lifting heavy weights, but I can manage (5) I have hardly any social life because of my pain. light to medium weights if they are  conveniently positioned  4. Walking 3 =  Pain prevents me from walking more than  mile.  5. Sitting 3 =  Pain prevents me from sitting more than  hour.  6. Standing 4 =  Pain prevents me from standing more than 10 minutes.  7. Sleeping 3 =  Even when I take pain medication, I sleep less than 4 hours.  8. Social Life 3 =  Pain prevents me from going out very often.  9. Traveling 4 = My pain restricts my travel to short necessary journeys under 1/2 hour.  10. Employment/ Homemaking 2 = I can perform most of my homemaking/job duties, but pain prevents me from performing more physically stressful activities (eg, lifting, vacuuming).  Total 30/50   Interpretation of scores: Score Category Description  0-20% Minimal Disability The patient can cope with most living activities. Usually no treatment is indicated apart from advice on lifting, sitting and exercise  21-40% Moderate Disability The patient experiences more pain and difficulty with sitting, lifting and standing. Travel and social life are more difficult and they may be disabled from work. Personal care, sexual activity and sleeping are not grossly affected, and the patient can usually be managed by conservative means  41-60% Severe Disability Pain remains the main problem in this group, but activities of daily living are affected. These patients require a detailed investigation  61-80% Crippled Back pain impinges on all aspects of the patient's life. Positive intervention is required  81-100% Bed-bound  These patients are either bed-bound or exaggerating their symptoms  Bluford FORBES Zoe DELENA Karon DELENA, et al. Surgery versus conservative management of stable thoracolumbar fracture: the PRESTO feasibility RCT. [place] ([place]): [employer]; 2021 Nov. [facility] Technology Assessment, No. 25.62.) Appendix 3, Oswestry Disability Index category descriptors. Available from: FindJewel[employer].cz  Minimally Clinically  Important Difference (MCID) = 12.8%  COGNITION: Overall cognitive status: Within functional limits for tasks assessed     SENSATION: Patient reports minimal tingling at left IT band  POSTURE: forward head  PALPATION: TTP: bilateral lumbar paraspinals (L>R), TFL, IT band, hamstrings, and gastrocnemius  LUMBAR JOINT MOBILITY:  L1-4: WFL and nonpainful   L4-S1: hypomobile and painful  LUMBAR  ROM:   AROM eval  Flexion 30; uncomfortable   Extension 24  Right lateral flexion WFL   Left lateral flexion WFL   Right rotation 25% limited; familiar LLE pain  Left rotation WFL   (Blank rows = not tested)  LOWER EXTREMITY ROM: WFL for activities assessed  LOWER EXTREMITY MMT:    MMT Right eval Left eval  Hip flexion 4+/5 4/5  Hip extension    Hip abduction    Hip adduction    Hip internal rotation    Hip external rotation    Knee flexion 4+/5 4-/5; familiar LLE pain  Knee extension 5/5 5/5; tight feeling in left low back  Ankle dorsiflexion 4/5 4/5; familiar LLE pain  Ankle plantarflexion    Ankle inversion    Ankle eversion     (Blank rows = not tested)  GAIT: Assistive device utilized: None Level of assistance: Complete Independence   TREATMENT DATE:      06/02/24:                                       EXERCISE LOG  Exercise Repetitions and Resistance Comments  Nustep Level 3 x 12 minutes   SKTC 1 minute holds bilateral 2 sets   DKTC 1 minute holds bilateral 2 sets   Hip bridges 2 x 10        Right sdly position with pillow knees:  STW/M x 9 minutes to left LB musculature f/b IFC at 80-150 Hz on 40% scan x 20 minutes.                                                                                                                                                                05/31/24  EXERCISE LOG  Exercise Repetitions and Resistance Comments  Hamstring stretch  3 x 30 second hold    Hamstring set  10 reps w/ 5 second hold                 Blank cell =  exercise not performed today    PATIENT EDUCATION:  Education details: See below. Person educated: Patient Education method: Explanation, handout. Education comprehension: verbalized understanding  HOME EXERCISE PROGRAM: SKTC  [[id number]]  SINGLE KNEE TO CHEST STRETCH - SKTC -  Repeat 3 Repetitions, Hold 1 Minute, Complete 1 Set, Perform 3 Times a Day  DOUBLE KNEE TO CHEST STRETCH - DKTC -  Repeat 2 Repetitions, Hold 1 Minute, Complete 1 Set, Perform 3 Times a Day  Bridges -  Repeat 15 Repetitions, Hold 2 Seconds, Complete 2 Sets, Perform 2 Times a Day  ASSESSMENT:  CLINICAL IMPRESSION: Patient is highly motivated and states exercises are helping.  Added therex to treatment today with pictures provided for his HEP.    OBJECTIVE IMPAIRMENTS: decreased activity tolerance, decreased mobility, decreased ROM, decreased strength, hypomobility, impaired tone, and pain.   ACTIVITY LIMITATIONS: carrying, lifting, sitting, and locomotion level  PARTICIPATION LIMITATIONS: shopping, community activity, and yard work  PERSONAL FACTORS: Past/current experiences and 3+ comorbidities: Hypertension, abdominal aortic aneurysm, arthritis, and allergies are also affecting patient's functional outcome.   REHAB POTENTIAL: Good  CLINICAL DECISION MAKING: Evolving/moderate complexity  EVALUATION COMPLEXITY: Moderate   GOALS: Goals reviewed with patient? Yes  SHORT TERM GOALS: Target date: 06/21/24  Patient will be independent with his initial HEP.  Baseline: Goal status: INITIAL  2.  Patient will be able to complete his daily activities without his familiar pain exceeding 7/10. Baseline:  Goal status: INITIAL  3.  Patient will improve his ODI score to 46% (23/50) disability or less for improved perceived function with his daily activities.  Baseline:  Goal status: INITIAL  LONG TERM GOALS: Target date: 07/12/24  Patient will be independent with advanced HEP.  Baseline:  Goal status:  INITIAL  2.  Patient will be able to complete his daily activities without his familiar pain exceeding 4/10. Baseline:  Goal status: INITIAL  3.  Patient will improve his ODI score to 30% (15/50) disability or less for improved perceived function with his daily activities.  Baseline:  Goal status: INITIAL  4.  Patient will improve his left hamstring strength to at least 4+/5 for improved function with his daily activities.  Baseline:  Goal status: INITIAL  5.  Patient will improve his lumbar flexion to at least 45 degrees for improved function picking up items from the floor.  Baseline:  Goal status: INITIAL  PLAN:  PT FREQUENCY: 1-2x/week  PT DURATION: 6 weeks  PLANNED INTERVENTIONS: 97164- PT Re-evaluation, 97750- Physical Performance Testing, 97110-Therapeutic exercises, 97530- Therapeutic activity, [id number]- Neuromuscular re-education, 97535- Self Care, 02859- Manual therapy, G0283- Electrical stimulation (unattended), [phone]- Traction (mechanical), 20560 (1-2 muscles), 20561 (3+ muscles)- Dry Needling, Patient/Family education, Balance training, Joint mobilization, Spinal mobilization, Cryotherapy, and Moist heat.  PLAN FOR NEXT SESSION: Nustep, lumbar and lower extremity strengthening, review and update HEP, and modalities as needed   Dennisha Mouser, [place], PT 06/02/2024, 9:35 AM

## 2024-06-09 ENCOUNTER — Ambulatory Visit: Attending: Family Medicine

## 2024-06-09 DIAGNOSIS — M79652 Pain in left thigh: Secondary | ICD-10-CM | POA: Insufficient documentation

## 2024-06-09 DIAGNOSIS — M5416 Radiculopathy, lumbar region: Secondary | ICD-10-CM | POA: Insufficient documentation

## 2024-06-09 DIAGNOSIS — M25552 Pain in left hip: Secondary | ICD-10-CM | POA: Insufficient documentation

## 2024-06-09 NOTE — Therapy (Signed)
 OUTPATIENT PHYSICAL THERAPY THORACOLUMBAR TREATMENT   Patient Name: James Davenport MRN: [id number] DOB:January 29, 1956, 68 y.o., male Today's Date: 06/09/2024  END OF SESSION:  PT End of Session - 06/09/24 0756     Visit Number 3    Number of Visits 12    Date for PT Re-Evaluation 08/06/24    PT Start Time 0800    PT Stop Time 0847    PT Time Calculation (min) 47 min    Activity Tolerance Patient tolerated treatment well    Behavior During Therapy [facility] for tasks assessed/performed           Past Medical History:  Diagnosis Date   AAA (abdominal aortic aneurysm) without rupture (HCC) 09/19/2021   Allergy    Aortic atherosclerosis (HCC)    Arthritis 11/24/2020   Bilateral carpal tunnel syndrome 11/24/2020   Chronic left shoulder pain 11/24/2020   GERD (gastroesophageal reflux disease) 11/24/2020   Hyperlipidemia LDL goal <100 01/31/2016   Hypertension    Leg cramps 11/24/2020   Ulcer    Past Surgical History:  Procedure Laterality Date   carple tunnel Left 01/2024   ELBOW SURGERY  01/2024   LEG SURGERY Right 1975   Patient Active Problem List   Diagnosis Date Noted   Class 1 obesity due to excess calories with serious comorbidity and body mass index (BMI) of 30.0 to 30.9 in adult 11/19/2023   Primary hypertension 08/11/2023   Statin intolerance 02/05/2023   Chronic midline low back pain with left-sided sciatica 02/05/2023   Allergic reaction to plant, excluding food 02/05/2023   Bee sting allergy 01/23/2022   AAA (abdominal aortic aneurysm) without rupture (HCC) 09/19/2021   Shortness of breath 08/13/2021   Seasonal allergies 01/09/2021   Seborrheic dermatitis 01/09/2021   History of colon polyps 01/09/2021   Aortic atherosclerosis (HCC)    GERD (gastroesophageal reflux disease) 11/24/2020   Arthritis 11/24/2020   Leg cramps 11/24/2020   Bilateral carpal tunnel syndrome 11/24/2020   Chronic left shoulder pain 11/24/2020   Hyperlipidemia LDL goal <100  01/31/2016   REFERRING PROVIDER: Joesph Annabella HERO, FNP   REFERRING DIAG: Lumbar radiculopathy   Rationale for Evaluation and Treatment: Rehabilitation  THERAPY DIAG:  Radiculopathy, lumbar region  ONSET DATE: 3 weeks ago  SUBJECTIVE:                                                                                                                                                                                           SUBJECTIVE STATEMENT: Patient reports that he is not really hurting today. However, he is really sore.   PERTINENT HISTORY:  Hypertension, abdominal aortic aneurysm,  arthritis, and allergies  PAIN:  Are you having pain? Yes: NPRS scale: Current: no pain score provided Best: 0/10 Worst: 10/10 Pain location: left hip and lateral leg  Pain description: stabbing Aggravating factors: sitting on a wooden chair (10 minutes at most)  Relieving factors: wrapping his leg  PRECAUTIONS: None  RED FLAGS: Abdominal aneurysm: Yes: as noted in his medical record   WEIGHT BEARING RESTRICTIONS: No  FALLS:  Has patient fallen in last 6 months? No  LIVING ENVIRONMENT: Lives with: lives with an adult companion Lives in: House/apartment Stairs: Yes: External: 6 steps; can reach both; reciprocal pattern Has following equipment at home: None  OCCUPATION: retired  PLOF: Independent  PATIENT GOALS: reduced pain, and improved strength  NEXT MD VISIT: 11/23/23  OBJECTIVE:  Note: Objective measures were completed at Evaluation unless otherwise noted.  PATIENT SURVEYS:  Modified Oswestry:  MODIFIED OSWESTRY DISABILITY SCALE  Date: 05/31/24 Score  Pain intensity 3 =  Pain medication provides me with moderate relief from pain.  2. Personal care (washing, dressing, etc.) 2 =  It is painful to take care of myself, and I am slow and careful.  3. Lifting 3 = Pain prevents me from lifting heavy weights, but I can manage (5) I have hardly any social life because of my pain. light to  medium weights if they are conveniently positioned  4. Walking 3 =  Pain prevents me from walking more than  mile.  5. Sitting 3 =  Pain prevents me from sitting more than  hour.  6. Standing 4 =  Pain prevents me from standing more than 10 minutes.  7. Sleeping 3 =  Even when I take pain medication, I sleep less than 4 hours.  8. Social Life 3 =  Pain prevents me from going out very often.  9. Traveling 4 = My pain restricts my travel to short necessary journeys under 1/2 hour.  10. Employment/ Homemaking 2 = I can perform most of my homemaking/job duties, but pain prevents me from performing more physically stressful activities (eg, lifting, vacuuming).  Total 30/50   Interpretation of scores: Score Category Description  0-20% Minimal Disability The patient can cope with most living activities. Usually no treatment is indicated apart from advice on lifting, sitting and exercise  21-40% Moderate Disability The patient experiences more pain and difficulty with sitting, lifting and standing. Travel and social life are more difficult and they may be disabled from work. Personal care, sexual activity and sleeping are not grossly affected, and the patient can usually be managed by conservative means  41-60% Severe Disability Pain remains the main problem in this group, but activities of daily living are affected. These patients require a detailed investigation  61-80% Crippled Back pain impinges on all aspects of the patient's life. Positive intervention is required  81-100% Bed-bound  These patients are either bed-bound or exaggerating their symptoms  Bluford FORBES Zoe DELENA Karon DELENA, et al. Surgery versus conservative management of stable thoracolumbar fracture: the PRESTO feasibility RCT. [place] ([place]): [employer]; 2021 Nov. [facility] Technology Assessment, No. 25.62.) Appendix 3, Oswestry Disability Index category descriptors. Available from:  [employer]cz  Minimally Clinically Important Difference (MCID) = 12.8%  COGNITION: Overall cognitive status: Within functional limits for tasks assessed     SENSATION: Patient reports minimal tingling at left IT band  POSTURE: forward head  PALPATION: TTP: bilateral lumbar paraspinals (L>R), TFL, IT band, hamstrings, and gastrocnemius  LUMBAR JOINT MOBILITY:  L1-4: WFL and nonpainful  L4-S1: hypomobile and painful  LUMBAR ROM:   AROM eval  Flexion 30; uncomfortable   Extension 24  Right lateral flexion WFL   Left lateral flexion WFL   Right rotation 25% limited; familiar LLE pain  Left rotation WFL   (Blank rows = not tested)  LOWER EXTREMITY ROM: WFL for activities assessed  LOWER EXTREMITY MMT:    MMT Right eval Left eval  Hip flexion 4+/5 4/5  Hip extension    Hip abduction    Hip adduction    Hip internal rotation    Hip external rotation    Knee flexion 4+/5 4-/5; familiar LLE pain  Knee extension 5/5 5/5; tight feeling in left low back  Ankle dorsiflexion 4/5 4/5; familiar LLE pain  Ankle plantarflexion    Ankle inversion    Ankle eversion     (Blank rows = not tested)  GAIT: Assistive device utilized: None Level of assistance: Complete Independence   TREATMENT DATE:                                       06/09/24 EXERCISE LOG  Exercise Repetitions and Resistance Comments  Recumbent bike  L19 x 16 minutes   Rocker board  5 minutes    Standing open books  20 reps each  For lumbar rotation  Self lumbar joint mobilization  With belt       Blank cell = exercise not performed today   06/02/24:     EXERCISE LOG  Exercise Repetitions and Resistance Comments  Nustep Level 3 x 12 minutes   SKTC 1 minute holds bilateral 2 sets   DKTC 1 minute holds bilateral 2 sets   Hip bridges 2 x 10        Right sdly position with pillow knees:  STW/M x 9 minutes to left LB musculature f/b IFC at 80-150 Hz on 40% scan x 20  minutes.                                                                                                                                                                05/31/24  EXERCISE LOG  Exercise Repetitions and Resistance Comments  Hamstring stretch  3 x 30 second hold    Hamstring set  10 reps w/ 5 second hold                 Blank cell = exercise not performed today    PATIENT EDUCATION:  Education details: HEP, osteoarthritis, referred pain, and benefits of exercise Person educated: Patient Education method: Explanation Education comprehension: verbalized understanding  HOME EXERCISE PROGRAM: SKTC  [[id number]]  SINGLE KNEE TO CHEST STRETCH - SKTC -  Repeat 3 Repetitions, Hold 1 Minute, Complete 1 Set, Perform 3 Times a Day  DOUBLE KNEE TO CHEST STRETCH - DKTC -  Repeat 2 Repetitions, Hold 1 Minute, Complete 1 Set, Perform 3 Times a Day  Bridges -  Repeat 15 Repetitions, Hold 2 Seconds, Complete 2 Sets, Perform 2 Times a Day  06/09/24: [id number]  ASSESSMENT:  CLINICAL IMPRESSION: Patient was introduced to multiple new interventions for reduced pain and improved lumbar mobility. He required minimal cueing with today's new interventions for proper exercise performance. He was educated on referred pain, osteoarthritis, and how exercise can benefit his symptoms. He reported understanding and his questions were encouraged and answered. He reported feeling good upon the conclusion of treatment. He continues to require skilled physical therapy to address his remaining impairments to return to his prior level of function.   OBJECTIVE IMPAIRMENTS: decreased activity tolerance, decreased mobility, decreased ROM, decreased strength, hypomobility, impaired tone, and pain.   ACTIVITY LIMITATIONS: carrying, lifting, sitting, and locomotion level  PARTICIPATION LIMITATIONS: shopping, community activity, and yard work  PERSONAL FACTORS: Past/current experiences and 3+ comorbidities:  Hypertension, abdominal aortic aneurysm, arthritis, and allergies are also affecting patient's functional outcome.   REHAB POTENTIAL: Good  CLINICAL DECISION MAKING: Evolving/moderate complexity  EVALUATION COMPLEXITY: Moderate   GOALS: Goals reviewed with patient? Yes  SHORT TERM GOALS: Target date: 06/21/24  Patient will be independent with his initial HEP.  Baseline: Goal status: INITIAL  2.  Patient will be able to complete his daily activities without his familiar pain exceeding 7/10. Baseline:  Goal status: INITIAL  3.  Patient will improve his ODI score to 46% (23/50) disability or less for improved perceived function with his daily activities.  Baseline:  Goal status: INITIAL  LONG TERM GOALS: Target date: 07/12/24  Patient will be independent with advanced HEP.  Baseline:  Goal status: INITIAL  2.  Patient will be able to complete his daily activities without his familiar pain exceeding 4/10. Baseline:  Goal status: INITIAL  3.  Patient will improve his ODI score to 30% (15/50) disability or less for improved perceived function with his daily activities.  Baseline:  Goal status: INITIAL  4.  Patient will improve his left hamstring strength to at least 4+/5 for improved function with his daily activities.  Baseline:  Goal status: INITIAL  5.  Patient will improve his lumbar flexion to at least 45 degrees for improved function picking up items from the floor.  Baseline:  Goal status: INITIAL  PLAN:  PT FREQUENCY: 1-2x/week  PT DURATION: 6 weeks  PLANNED INTERVENTIONS: 97164- PT Re-evaluation, 97750- Physical Performance Testing, 97110-Therapeutic exercises, 97530- Therapeutic activity, [id number]- Neuromuscular re-education, 97535- Self Care, 02859- Manual therapy, G0283- Electrical stimulation (unattended), [phone]- Traction (mechanical), 20560 (1-2 muscles), 20561 (3+ muscles)- Dry Needling, Patient/Family education, Balance training, Joint mobilization, Spinal  mobilization, Cryotherapy, and Moist heat.  PLAN FOR NEXT SESSION: Nustep, lumbar and lower extremity strengthening, review and update HEP, and modalities as needed   Lacinda JAYSON Fass, PT 06/09/2024, 9:52 AM

## 2024-06-10 ENCOUNTER — Other Ambulatory Visit: Payer: Self-pay | Admitting: Family Medicine

## 2024-06-10 DIAGNOSIS — G8929 Other chronic pain: Secondary | ICD-10-CM

## 2024-06-10 DIAGNOSIS — M25522 Pain in left elbow: Secondary | ICD-10-CM

## 2024-06-14 ENCOUNTER — Ambulatory Visit

## 2024-06-14 DIAGNOSIS — M5416 Radiculopathy, lumbar region: Secondary | ICD-10-CM

## 2024-06-14 DIAGNOSIS — M79652 Pain in left thigh: Secondary | ICD-10-CM | POA: Diagnosis not present

## 2024-06-14 DIAGNOSIS — M25552 Pain in left hip: Secondary | ICD-10-CM | POA: Diagnosis not present

## 2024-06-14 NOTE — Therapy (Signed)
 OUTPATIENT PHYSICAL THERAPY THORACOLUMBAR TREATMENT   Patient Name: James Davenport MRN: [id number] DOB:Oct 05, 1956, 68 y.o., male Today's Date: 06/14/2024  END OF SESSION:  PT End of Session - 06/14/24 0802     Visit Number 4    Number of Visits 12    Date for PT Re-Evaluation 08/06/24    Authorization - Number of Visits 10     PT Start Time 0800    PT Stop Time 0854    PT Time Calculation (min) 54 min    Activity Tolerance Patient tolerated treatment well    Behavior During Therapy [facility] for tasks assessed/performed           Past Medical History:  Diagnosis Date   AAA (abdominal aortic aneurysm) without rupture (HCC) 09/19/2021   Allergy    Aortic atherosclerosis (HCC)    Arthritis 11/24/2020   Bilateral carpal tunnel syndrome 11/24/2020   Chronic left shoulder pain 11/24/2020   GERD (gastroesophageal reflux disease) 11/24/2020   Hyperlipidemia LDL goal <100 01/31/2016   Hypertension    Leg cramps 11/24/2020   Ulcer    Past Surgical History:  Procedure Laterality Date   carple tunnel Left 01/2024   ELBOW SURGERY  01/2024   LEG SURGERY Right 1975   Patient Active Problem List   Diagnosis Date Noted   Class 1 obesity due to excess calories with serious comorbidity and body mass index (BMI) of 30.0 to 30.9 in adult 11/19/2023   Primary hypertension 08/11/2023   Statin intolerance 02/05/2023   Chronic midline low back pain with left-sided sciatica 02/05/2023   Allergic reaction to plant, excluding food 02/05/2023   Bee sting allergy 01/23/2022   AAA (abdominal aortic aneurysm) without rupture (HCC) 09/19/2021   Shortness of breath 08/13/2021   Seasonal allergies 01/09/2021   Seborrheic dermatitis 01/09/2021   History of colon polyps 01/09/2021   Aortic atherosclerosis (HCC)    GERD (gastroesophageal reflux disease) 11/24/2020   Arthritis 11/24/2020   Leg cramps 11/24/2020   Bilateral carpal tunnel syndrome 11/24/2020   Chronic left shoulder pain 11/24/2020    Hyperlipidemia LDL goal <100 01/31/2016   REFERRING PROVIDER: Joesph Annabella HERO, FNP   REFERRING DIAG: Lumbar radiculopathy   Rationale for Evaluation and Treatment: Rehabilitation  THERAPY DIAG:  Radiculopathy, lumbar region  ONSET DATE: 3 weeks ago  SUBJECTIVE:                                                                                                                                                                                           SUBJECTIVE STATEMENT: Pt denies any pain today.  PERTINENT HISTORY:  Hypertension, abdominal aortic aneurysm,  arthritis, and allergies  PAIN:  Are you having pain? No  PRECAUTIONS: None  RED FLAGS: Abdominal aneurysm: Yes: as noted in his medical record   WEIGHT BEARING RESTRICTIONS: No  FALLS:  Has patient fallen in last 6 months? No  LIVING ENVIRONMENT: Lives with: lives with an adult companion Lives in: House/apartment Stairs: Yes: External: 6 steps; can reach both; reciprocal pattern Has following equipment at home: None  OCCUPATION: retired  PLOF: Independent  PATIENT GOALS: reduced pain, and improved strength  NEXT MD VISIT: 11/23/23  OBJECTIVE:  Note: Objective measures were completed at Evaluation unless otherwise noted.  PATIENT SURVEYS:  Modified Oswestry:  MODIFIED OSWESTRY DISABILITY SCALE  Date: 05/31/24 Score  Pain intensity 3 =  Pain medication provides me with moderate relief from pain.  2. Personal care (washing, dressing, etc.) 2 =  It is painful to take care of myself, and I am slow and careful.  3. Lifting 3 = Pain prevents me from lifting heavy weights, but I can manage (5) I have hardly any social life because of my pain. light to medium weights if they are conveniently positioned  4. Walking 3 =  Pain prevents me from walking more than  mile.  5. Sitting 3 =  Pain prevents me from sitting more than  hour.  6. Standing 4 =  Pain prevents me from standing more than 10 minutes.  7. Sleeping 3  =  Even when I take pain medication, I sleep less than 4 hours.  8. Social Life 3 =  Pain prevents me from going out very often.  9. Traveling 4 = My pain restricts my travel to short necessary journeys under 1/2 hour.  10. Employment/ Homemaking 2 = I can perform most of my homemaking/job duties, but pain prevents me from performing more physically stressful activities (eg, lifting, vacuuming).  Total 30/50   Interpretation of scores: Score Category Description  0-20% Minimal Disability The patient can cope with most living activities. Usually no treatment is indicated apart from advice on lifting, sitting and exercise  21-40% Moderate Disability The patient experiences more pain and difficulty with sitting, lifting and standing. Travel and social life are more difficult and they may be disabled from work. Personal care, sexual activity and sleeping are not grossly affected, and the patient can usually be managed by conservative means  41-60% Severe Disability Pain remains the main problem in this group, but activities of daily living are affected. These patients require a detailed investigation  61-80% Crippled Back pain impinges on all aspects of the patient's life. Positive intervention is required  81-100% Bed-bound  These patients are either bed-bound or exaggerating their symptoms  Bluford FORBES Zoe DELENA Karon DELENA, et al. Surgery versus conservative management of stable thoracolumbar fracture: the PRESTO feasibility RCT. [place] ([place]): [employer]; 2021 Nov. [facility] Technology Assessment, No. 25.62.) Appendix 3, Oswestry Disability Index category descriptors. Available from: [employer]cz  Minimally Clinically Important Difference (MCID) = 12.8%  COGNITION: Overall cognitive status: Within functional limits for tasks assessed     SENSATION: Patient reports minimal tingling at left IT band  POSTURE: forward head  PALPATION: TTP: bilateral  lumbar paraspinals (L>R), TFL, IT band, hamstrings, and gastrocnemius  LUMBAR JOINT MOBILITY:  L1-4: WFL and nonpainful   L4-S1: hypomobile and painful  LUMBAR ROM:   AROM eval  Flexion 30; uncomfortable   Extension 24  Right lateral flexion WFL   Left lateral flexion WFL   Right rotation 25% limited; familiar LLE  pain  Left rotation WFL   (Blank rows = not tested)  LOWER EXTREMITY ROM: WFL for activities assessed  LOWER EXTREMITY MMT:    MMT Right eval Left eval  Hip flexion 4+/5 4/5  Hip extension    Hip abduction    Hip adduction    Hip internal rotation    Hip external rotation    Knee flexion 4+/5 4-/5; familiar LLE pain  Knee extension 5/5 5/5; tight feeling in left low back  Ankle dorsiflexion 4/5 4/5; familiar LLE pain  Ankle plantarflexion    Ankle inversion    Ankle eversion     (Blank rows = not tested)  GAIT: Assistive device utilized: None Level of assistance: Complete Independence   TREATMENT DATE:      06/14/24    EXERCISE LOG  Exercise Repetitions and Resistance Comments  Recumbent bike  Lvl 15 - 19 x 16 minutes   Rocker board  5 minutes    Forward Step Ups 6 box x 5 mins alternating   Standing open books   For lumbar rotation  Self lumbar joint mobilization  With belt       Blank cell = exercise not performed today   Modalities  Date:  Traction: lumbar, 75#/5# max/min; 99/5 on/off time, 15 mins mins  06/02/24:     EXERCISE LOG  Exercise Repetitions and Resistance Comments  Nustep Level 3 x 12 minutes   SKTC 1 minute holds bilateral 2 sets   DKTC 1 minute holds bilateral 2 sets   Hip bridges 2 x 10        Right sdly position with pillow knees:  STW/M x 9 minutes to left LB musculature f/b IFC at 80-150 Hz on 40% scan x 20 minutes.                                                                                                                                                           05/31/24  EXERCISE LOG  Exercise Repetitions and  Resistance Comments  Hamstring stretch  3 x 30 second hold    Hamstring set  10 reps w/ 5 second hold                 Blank cell = exercise not performed today    PATIENT EDUCATION:  Education details: HEP, osteoarthritis, referred pain, and benefits of exercise Person educated: Patient Education method: Explanation Education comprehension: verbalized understanding  HOME EXERCISE PROGRAM: SKTC  [[id number]]  SINGLE KNEE TO CHEST STRETCH - SKTC -  Repeat 3 Repetitions, Hold 1 Minute, Complete 1 Set, Perform 3 Times a Day  DOUBLE KNEE TO CHEST STRETCH - DKTC -  Repeat 2 Repetitions, Hold 1 Minute, Complete 1 Set, Perform 3 Times a Day  Bridges -  Repeat 15 Repetitions, Hold 2  Seconds, Complete 2 Sets, Perform 2 Times a Day  06/09/24: [id number]  ASSESSMENT:  CLINICAL IMPRESSION: Pt arrives for today's treatment session denying any pain.  Pt with minimal lateral knee pain after completion of recumbent bike.  Pt introduced to forward step up with alternating pattern with good results.  Mechanical traction performed today with 75#/5# max/min weight and 99/5 on/off time.  Pt reports great results at completion of traction.  Pt reported 0/10 low back pain at completion of today's treatment session.   OBJECTIVE IMPAIRMENTS: decreased activity tolerance, decreased mobility, decreased ROM, decreased strength, hypomobility, impaired tone, and pain.   ACTIVITY LIMITATIONS: carrying, lifting, sitting, and locomotion level  PARTICIPATION LIMITATIONS: shopping, community activity, and yard work  PERSONAL FACTORS: Past/current experiences and 3+ comorbidities: Hypertension, abdominal aortic aneurysm, arthritis, and allergies are also affecting patient's functional outcome.   REHAB POTENTIAL: Good  CLINICAL DECISION MAKING: Evolving/moderate complexity  EVALUATION COMPLEXITY: Moderate   GOALS: Goals reviewed with patient? Yes  SHORT TERM GOALS: Target date: 06/21/24  Patient will be  independent with his initial HEP.  Baseline: Goal status: INITIAL  2.  Patient will be able to complete his daily activities without his familiar pain exceeding 7/10. Baseline:  Goal status: INITIAL  3.  Patient will improve his ODI score to 46% (23/50) disability or less for improved perceived function with his daily activities.  Baseline:  Goal status: INITIAL  LONG TERM GOALS: Target date: 07/12/24  Patient will be independent with advanced HEP.  Baseline:  Goal status: INITIAL  2.  Patient will be able to complete his daily activities without his familiar pain exceeding 4/10. Baseline:  Goal status: INITIAL  3.  Patient will improve his ODI score to 30% (15/50) disability or less for improved perceived function with his daily activities.  Baseline:  Goal status: INITIAL  4.  Patient will improve his left hamstring strength to at least 4+/5 for improved function with his daily activities.  Baseline:  Goal status: INITIAL  5.  Patient will improve his lumbar flexion to at least 45 degrees for improved function picking up items from the floor.  Baseline:  Goal status: INITIAL  PLAN:  PT FREQUENCY: 1-2x/week  PT DURATION: 6 weeks  PLANNED INTERVENTIONS: 97164- PT Re-evaluation, 97750- Physical Performance Testing, 97110-Therapeutic exercises, 97530- Therapeutic activity, [id number]- Neuromuscular re-education, 97535- Self Care, 02859- Manual therapy, G0283- Electrical stimulation (unattended), [phone]- Traction (mechanical), 20560 (1-2 muscles), 20561 (3+ muscles)- Dry Needling, Patient/Family education, Balance training, Joint mobilization, Spinal mobilization, Cryotherapy, and Moist heat.  PLAN FOR NEXT SESSION: Nustep, lumbar and lower extremity strengthening, review and update HEP, and modalities as needed   Delon DELENA Gosling, PTA 06/14/2024, 9:27 AM

## 2024-06-18 ENCOUNTER — Other Ambulatory Visit: Payer: Self-pay | Admitting: Family Medicine

## 2024-06-18 DIAGNOSIS — I10 Essential (primary) hypertension: Secondary | ICD-10-CM

## 2024-06-21 ENCOUNTER — Ambulatory Visit: Admitting: *Deleted

## 2024-06-21 ENCOUNTER — Encounter: Payer: Self-pay | Admitting: *Deleted

## 2024-06-21 DIAGNOSIS — M5416 Radiculopathy, lumbar region: Secondary | ICD-10-CM | POA: Diagnosis not present

## 2024-06-21 DIAGNOSIS — M25552 Pain in left hip: Secondary | ICD-10-CM | POA: Diagnosis not present

## 2024-06-21 DIAGNOSIS — M79652 Pain in left thigh: Secondary | ICD-10-CM | POA: Diagnosis not present

## 2024-06-21 NOTE — Therapy (Signed)
 OUTPATIENT PHYSICAL THERAPY THORACOLUMBAR TREATMENT   Patient Name: James Davenport MRN: [id number] DOB:1956/08/23, 68 y.o., male Today's Date: 06/21/2024  END OF SESSION:  PT End of Session - 06/21/24 0841     Visit Number 5    Number of Visits 12    Date for PT Re-Evaluation 08/06/24    Authorization - Number of Visits 10    PT Start Time 0800    PT Stop Time 0851    PT Time Calculation (min) 51 min           Past Medical History:  Diagnosis Date   AAA (abdominal aortic aneurysm) without rupture (HCC) 09/19/2021   Allergy    Aortic atherosclerosis (HCC)    Arthritis 11/24/2020   Bilateral carpal tunnel syndrome 11/24/2020   Chronic left shoulder pain 11/24/2020   GERD (gastroesophageal reflux disease) 11/24/2020   Hyperlipidemia LDL goal <100 01/31/2016   Hypertension    Leg cramps 11/24/2020   Ulcer    Past Surgical History:  Procedure Laterality Date   carple tunnel Left 01/2024   ELBOW SURGERY  01/2024   LEG SURGERY Right 1975   Patient Active Problem List   Diagnosis Date Noted   Class 1 obesity due to excess calories with serious comorbidity and body mass index (BMI) of 30.0 to 30.9 in adult 11/19/2023   Primary hypertension 08/11/2023   Statin intolerance 02/05/2023   Chronic midline low back pain with left-sided sciatica 02/05/2023   Allergic reaction to plant, excluding food 02/05/2023   Bee sting allergy 01/23/2022   AAA (abdominal aortic aneurysm) without rupture (HCC) 09/19/2021   Shortness of breath 08/13/2021   Seasonal allergies 01/09/2021   Seborrheic dermatitis 01/09/2021   History of colon polyps 01/09/2021   Aortic atherosclerosis (HCC)    GERD (gastroesophageal reflux disease) 11/24/2020   Arthritis 11/24/2020   Leg cramps 11/24/2020   Bilateral carpal tunnel syndrome 11/24/2020   Chronic left shoulder pain 11/24/2020   Hyperlipidemia LDL goal <100 01/31/2016   REFERRING PROVIDER: Joesph Annabella HERO, FNP   REFERRING DIAG: Lumbar  radiculopathy   Rationale for Evaluation and Treatment: Rehabilitation  THERAPY DIAG:  Radiculopathy, lumbar region  Pain in left thigh  ONSET DATE: 3 weeks ago  SUBJECTIVE:                                                                                                                                                                                           SUBJECTIVE STATEMENT: Pt arrived today with increased pain due to being up and doing a lot at home. Training weekend.  5/10  PERTINENT HISTORY:  Hypertension, abdominal aortic aneurysm,  arthritis, and allergies  PAIN:  Are you having pain? No  PRECAUTIONS: None  RED FLAGS: Abdominal aneurysm: Yes: as noted in his medical record   WEIGHT BEARING RESTRICTIONS: No  FALLS:  Has patient fallen in last 6 months? No  LIVING ENVIRONMENT: Lives with: lives with an adult companion Lives in: House/apartment Stairs: Yes: External: 6 steps; can reach both; reciprocal pattern Has following equipment at home: None  OCCUPATION: retired  PLOF: Independent  PATIENT GOALS: reduced pain, and improved strength  NEXT MD VISIT: 11/23/23  OBJECTIVE:  Note: Objective measures were completed at Evaluation unless otherwise noted.  PATIENT SURVEYS:  Modified Oswestry:  MODIFIED OSWESTRY DISABILITY SCALE  Date: 05/31/24 Score  Pain intensity 3 =  Pain medication provides me with moderate relief from pain.  2. Personal care (washing, dressing, etc.) 2 =  It is painful to take care of myself, and I am slow and careful.  3. Lifting 3 = Pain prevents me from lifting heavy weights, but I can manage (5) I have hardly any social life because of my pain. light to medium weights if they are conveniently positioned  4. Walking 3 =  Pain prevents me from walking more than  mile.  5. Sitting 3 =  Pain prevents me from sitting more than  hour.  6. Standing 4 =  Pain prevents me from standing more than 10 minutes.  7. Sleeping 3 =  Even when I  take pain medication, I sleep less than 4 hours.  8. Social Life 3 =  Pain prevents me from going out very often.  9. Traveling 4 = My pain restricts my travel to short necessary journeys under 1/2 hour.  10. Employment/ Homemaking 2 = I can perform most of my homemaking/job duties, but pain prevents me from performing more physically stressful activities (eg, lifting, vacuuming).  Total 30/50   Interpretation of scores: Score Category Description  0-20% Minimal Disability The patient can cope with most living activities. Usually no treatment is indicated apart from advice on lifting, sitting and exercise  21-40% Moderate Disability The patient experiences more pain and difficulty with sitting, lifting and standing. Travel and social life are more difficult and they may be disabled from work. Personal care, sexual activity and sleeping are not grossly affected, and the patient can usually be managed by conservative means  41-60% Severe Disability Pain remains the main problem in this group, but activities of daily living are affected. These patients require a detailed investigation  61-80% Crippled Back pain impinges on all aspects of the patient's life. Positive intervention is required  81-100% Bed-bound  These patients are either bed-bound or exaggerating their symptoms  Bluford FORBES Zoe DELENA Karon DELENA, et al. Surgery versus conservative management of stable thoracolumbar fracture: the PRESTO feasibility RCT. [place] ([place]): [employer]; 2021 Nov. [facility] Technology Assessment, No. 25.62.) Appendix 3, Oswestry Disability Index category descriptors. Available from: [employer]  Minimally Clinically Important Difference (MCID) = 12.8%  COGNITION: Overall cognitive status: Within functional limits for tasks assessed     SENSATION: Patient reports minimal tingling at left IT band  POSTURE: forward head  PALPATION: TTP: bilateral lumbar paraspinals  (L>R), TFL, IT band, hamstrings, and gastrocnemius  LUMBAR JOINT MOBILITY:  L1-4: WFL and nonpainful   L4-S1: hypomobile and painful  LUMBAR ROM:   AROM eval  Flexion 30; uncomfortable   Extension 24  Right lateral flexion WFL   Left lateral flexion WFL   Right rotation 25% limited; familiar LLE  pain  Left rotation WFL   (Blank rows = not tested)  LOWER EXTREMITY ROM: WFL for activities assessed  LOWER EXTREMITY MMT:    MMT Right eval Left eval  Hip flexion 4+/5 4/5  Hip extension    Hip abduction    Hip adduction    Hip internal rotation    Hip external rotation    Knee flexion 4+/5 4-/5; familiar LLE pain  Knee extension 5/5 5/5; tight feeling in left low back  Ankle dorsiflexion 4/5 4/5; familiar LLE pain  Ankle plantarflexion    Ankle inversion    Ankle eversion     (Blank rows = not tested)  GAIT: Assistive device utilized: None Level of assistance: Complete Independence   TREATMENT DATE:   06-21-24 Nustep  x 15 mins L5   Traction: lumbar, 75#/5# max/min; 99/5 on/off time, 15 mins mins IFC x 15 mins 80-150hz  to LB RT side lying   06/14/24    EXERCISE LOG  Exercise Repetitions and Resistance Comments  Recumbent bike  Lvl 15 - 19 x 16 minutes   Rocker board  5 minutes    Forward Step Ups 6 box x 5 mins alternating   Standing open books   For lumbar rotation  Self lumbar joint mobilization  With belt       Blank cell = exercise not performed today   Modalities  Date:  Traction: lumbar, 75#/5# max/min; 99/5 on/off time, 15 mins mins  06/02/24:     EXERCISE LOG  Exercise Repetitions and Resistance Comments  Nustep Level 3 x 12 minutes   SKTC 1 minute holds bilateral 2 sets   DKTC 1 minute holds bilateral 2 sets   Hip bridges 2 x 10        Right sdly position with pillow knees:  STW/M x 9 minutes to left LB musculature f/b IFC at 80-150 Hz on 40% scan x 20 minutes.                                                                                                                                                            05/31/24  EXERCISE LOG  Exercise Repetitions and Resistance Comments  Hamstring stretch  3 x 30 second hold    Hamstring set  10 reps w/ 5 second hold                 Blank cell = exercise not performed today    PATIENT EDUCATION:  Education details: HEP, osteoarthritis, referred pain, and benefits of exercise Person educated: Patient Education method: Explanation Education comprehension: verbalized understanding  HOME EXERCISE PROGRAM: SKTC  [[id number]]  SINGLE KNEE TO CHEST STRETCH - SKTC -  Repeat 3 Repetitions, Hold 1 Minute, Complete 1 Set, Perform 3 Times a Day  DOUBLE KNEE TO CHEST  STRETCH - DKTC -  Repeat 2 Repetitions, Hold 1 Minute, Complete 1 Set, Perform 3 Times a Day  Bridges -  Repeat 15 Repetitions, Hold 2 Seconds, Complete 2 Sets, Perform 2 Times a Day  06/09/24: [id number]  ASSESSMENT:  CLINICAL IMPRESSION: Pt arrived  today with increased LBP from doing a lot this weekend. Rx focused on therex , as well as IFC, and traction. Pt did great again with traction at 75#s and reports decreased pain end of session.                        OBJECTIVE IMPAIRMENTS: decreased activity tolerance, decreased mobility, decreased ROM, decreased strength, hypomobility, impaired tone, and pain.   ACTIVITY LIMITATIONS: carrying, lifting, sitting, and locomotion level  PARTICIPATION LIMITATIONS: shopping, community activity, and yard work  PERSONAL FACTORS: Past/current experiences and 3+ comorbidities: Hypertension, abdominal aortic aneurysm, arthritis, and allergies are also affecting patient's functional outcome.   REHAB POTENTIAL: Good  CLINICAL DECISION MAKING: Evolving/moderate complexity  EVALUATION COMPLEXITY: Moderate   GOALS: Goals reviewed with patient? Yes  SHORT TERM GOALS: Target date: 06/21/24  Patient will be independent with his initial HEP.  Baseline: Goal status: INITIAL  2.   Patient will be able to complete his daily activities without his familiar pain exceeding 7/10. Baseline:  Goal status: INITIAL  3.  Patient will improve his ODI score to 46% (23/50) disability or less for improved perceived function with his daily activities.  Baseline:  Goal status: INITIAL  LONG TERM GOALS: Target date: 07/12/24  Patient will be independent with advanced HEP.  Baseline:  Goal status: INITIAL  2.  Patient will be able to complete his daily activities without his familiar pain exceeding 4/10. Baseline:  Goal status: INITIAL  3.  Patient will improve his ODI score to 30% (15/50) disability or less for improved perceived function with his daily activities.  Baseline:  Goal status: INITIAL  4.  Patient will improve his left hamstring strength to at least 4+/5 for improved function with his daily activities.  Baseline:  Goal status: INITIAL  5.  Patient will improve his lumbar flexion to at least 45 degrees for improved function picking up items from the floor.  Baseline:  Goal status: INITIAL  PLAN:  PT FREQUENCY: 1-2x/week  PT DURATION: 6 weeks  PLANNED INTERVENTIONS: 97164- PT Re-evaluation, 97750- Physical Performance Testing, 97110-Therapeutic exercises, 97530- Therapeutic activity, [id number]- Neuromuscular re-education, 97535- Self Care, 02859- Manual therapy, [id number]- Electrical stimulation (unattended), [phone]- Traction (mechanical), 20560 (1-2 muscles), 20561 (3+ muscles)- Dry Needling, Patient/Family education, Balance training, Joint mobilization, Spinal mobilization, Cryotherapy, and Moist heat.  PLAN FOR NEXT SESSION: Nustep, lumbar and lower extremity strengthening, review and update HEP, and modalities as needed   Brexton Sofia,CHRIS, PTA 06/21/2024, 1:24 PM

## 2024-06-26 ENCOUNTER — Other Ambulatory Visit: Payer: Self-pay | Admitting: Family Medicine

## 2024-06-26 DIAGNOSIS — K219 Gastro-esophageal reflux disease without esophagitis: Secondary | ICD-10-CM

## 2024-06-26 DIAGNOSIS — J302 Other seasonal allergic rhinitis: Secondary | ICD-10-CM

## 2024-06-28 ENCOUNTER — Ambulatory Visit

## 2024-06-28 DIAGNOSIS — M25552 Pain in left hip: Secondary | ICD-10-CM | POA: Diagnosis not present

## 2024-06-28 DIAGNOSIS — M5416 Radiculopathy, lumbar region: Secondary | ICD-10-CM | POA: Diagnosis not present

## 2024-06-28 DIAGNOSIS — M79652 Pain in left thigh: Secondary | ICD-10-CM | POA: Diagnosis not present

## 2024-06-28 NOTE — Therapy (Signed)
 OUTPATIENT PHYSICAL THERAPY THORACOLUMBAR TREATMENT   Patient Name: James Davenport MRN: [id number] DOB:17-Mar-1956, 68 y.o., male Today's Date: 06/28/2024  END OF SESSION:  PT End of Session - 06/28/24 0805     Visit Number 6    Number of Visits 12    Date for Recertification  08/06/24    Authorization - Number of Visits 10    PT Start Time 0800    PT Stop Time 0849    PT Time Calculation (min) 49 min           Past Medical History:  Diagnosis Date   AAA (abdominal aortic aneurysm) without rupture (HCC) 09/19/2021   Allergy    Aortic atherosclerosis (HCC)    Arthritis 11/24/2020   Bilateral carpal tunnel syndrome 11/24/2020   Chronic left shoulder pain 11/24/2020   GERD (gastroesophageal reflux disease) 11/24/2020   Hyperlipidemia LDL goal <100 01/31/2016   Hypertension    Leg cramps 11/24/2020   Ulcer    Past Surgical History:  Procedure Laterality Date   carple tunnel Left 01/2024   ELBOW SURGERY  01/2024   LEG SURGERY Right 1975   Patient Active Problem List   Diagnosis Date Noted   Class 1 obesity due to excess calories with serious comorbidity and body mass index (BMI) of 30.0 to 30.9 in adult 11/19/2023   Primary hypertension 08/11/2023   Statin intolerance 02/05/2023   Chronic midline low back pain with left-sided sciatica 02/05/2023   Allergic reaction to plant, excluding food 02/05/2023   Bee sting allergy 01/23/2022   AAA (abdominal aortic aneurysm) without rupture (HCC) 09/19/2021   Shortness of breath 08/13/2021   Seasonal allergies 01/09/2021   Seborrheic dermatitis 01/09/2021   History of colon polyps 01/09/2021   Aortic atherosclerosis (HCC)    GERD (gastroesophageal reflux disease) 11/24/2020   Arthritis 11/24/2020   Leg cramps 11/24/2020   Bilateral carpal tunnel syndrome 11/24/2020   Chronic left shoulder pain 11/24/2020   Hyperlipidemia LDL goal <100 01/31/2016   REFERRING PROVIDER: Joesph Annabella HERO, FNP   REFERRING DIAG: Lumbar  radiculopathy   Rationale for Evaluation and Treatment: Rehabilitation  THERAPY DIAG:  Radiculopathy, lumbar region  Pain in left thigh  Pain in left hip  ONSET DATE: 3 weeks ago  SUBJECTIVE:                                                                                                                                                                                           SUBJECTIVE STATEMENT: Pt denies any pain today today, does report minimal left quad tingling.  PERTINENT HISTORY:  Hypertension, abdominal aortic aneurysm, arthritis, and allergies  PAIN:  Are you having pain? No  PRECAUTIONS: None  RED FLAGS: Abdominal aneurysm: Yes: as noted in his medical record   WEIGHT BEARING RESTRICTIONS: No  FALLS:  Has patient fallen in last 6 months? No  LIVING ENVIRONMENT: Lives with: lives with an adult companion Lives in: House/apartment Stairs: Yes: External: 6 steps; can reach both; reciprocal pattern Has following equipment at home: None  OCCUPATION: retired  PLOF: Independent  PATIENT GOALS: reduced pain, and improved strength  NEXT MD VISIT: 11/23/23  OBJECTIVE:  Note: Objective measures were completed at Evaluation unless otherwise noted.  PATIENT SURVEYS:  Modified Oswestry:  MODIFIED OSWESTRY DISABILITY SCALE  Date: 05/31/24 Score  Pain intensity 3 =  Pain medication provides me with moderate relief from pain.  2. Personal care (washing, dressing, etc.) 2 =  It is painful to take care of myself, and I am slow and careful.  3. Lifting 3 = Pain prevents me from lifting heavy weights, but I can manage (5) I have hardly any social life because of my pain. light to medium weights if they are conveniently positioned  4. Walking 3 =  Pain prevents me from walking more than  mile.  5. Sitting 3 =  Pain prevents me from sitting more than  hour.  6. Standing 4 =  Pain prevents me from standing more than 10 minutes.  7. Sleeping 3 =  Even when I take pain  medication, I sleep less than 4 hours.  8. Social Life 3 =  Pain prevents me from going out very often.  9. Traveling 4 = My pain restricts my travel to short necessary journeys under 1/2 hour.  10. Employment/ Homemaking 2 = I can perform most of my homemaking/job duties, but pain prevents me from performing more physically stressful activities (eg, lifting, vacuuming).  Total 30/50   Interpretation of scores: Score Category Description  0-20% Minimal Disability The patient can cope with most living activities. Usually no treatment is indicated apart from advice on lifting, sitting and exercise  21-40% Moderate Disability The patient experiences more pain and difficulty with sitting, lifting and standing. Travel and social life are more difficult and they may be disabled from work. Personal care, sexual activity and sleeping are not grossly affected, and the patient can usually be managed by conservative means  41-60% Severe Disability Pain remains the main problem in this group, but activities of daily living are affected. These patients require a detailed investigation  61-80% Crippled Back pain impinges on all aspects of the patient's life. Positive intervention is required  81-100% Bed-bound  These patients are either bed-bound or exaggerating their symptoms  Bluford FORBES Zoe DELENA Karon DELENA, et al. Surgery versus conservative management of stable thoracolumbar fracture: the PRESTO feasibility RCT. [place] ([place]): [employer]; 2021 Nov. [facility] Technology Assessment, No. 25.62.) Appendix 3, Oswestry Disability Index category descriptors. Available from: [employer]cz  Minimally Clinically Important Difference (MCID) = 12.8%  COGNITION: Overall cognitive status: Within functional limits for tasks assessed     SENSATION: Patient reports minimal tingling at left IT band  POSTURE: forward head  PALPATION: TTP: bilateral lumbar paraspinals (L>R),  TFL, IT band, hamstrings, and gastrocnemius  LUMBAR JOINT MOBILITY:  L1-4: WFL and nonpainful   L4-S1: hypomobile and painful  LUMBAR ROM:   AROM eval  Flexion 30; uncomfortable   Extension 24  Right lateral flexion WFL   Left lateral flexion WFL   Right rotation 25% limited; familiar LLE pain  Left rotation  WFL   (Blank rows = not tested)  LOWER EXTREMITY ROM: WFL for activities assessed  LOWER EXTREMITY MMT:    MMT Right eval Left eval  Hip flexion 4+/5 4/5  Hip extension    Hip abduction    Hip adduction    Hip internal rotation    Hip external rotation    Knee flexion 4+/5 4-/5; familiar LLE pain  Knee extension 5/5 5/5; tight feeling in left low back  Ankle dorsiflexion 4/5 4/5; familiar LLE pain  Ankle plantarflexion    Ankle inversion    Ankle eversion     (Blank rows = not tested)  GAIT: Assistive device utilized: None Level of assistance: Complete Independence   TREATMENT DATE:   06-28-24 Nustep  x 15 mins Lvl 6  Traction: lumbar, 80#/5# max/min; 99/5 on/off time, 15 mins mins IFC x 15 mins 80-150hz  to LB RT side lying   06/14/24    EXERCISE LOG  Exercise Repetitions and Resistance Comments  Recumbent bike  Lvl 15 - 19 x 16 minutes   Rocker board  5 minutes    Forward Step Ups 6 box x 5 mins alternating   Standing open books   For lumbar rotation  Self lumbar joint mobilization  With belt       Blank cell = exercise not performed today   Modalities  Date:  Traction: lumbar, 75#/5# max/min; 99/5 on/off time, 15 mins mins  06/02/24:     EXERCISE LOG  Exercise Repetitions and Resistance Comments  Nustep Level 3 x 12 minutes   SKTC 1 minute holds bilateral 2 sets   DKTC 1 minute holds bilateral 2 sets   Hip bridges 2 x 10        Right sdly position with pillow knees:  STW/M x 9 minutes to left LB musculature f/b IFC at 80-150 Hz on 40% scan x 20 minutes.                                                                                                                                                            05/31/24  EXERCISE LOG  Exercise Repetitions and Resistance Comments  Hamstring stretch  3 x 30 second hold    Hamstring set  10 reps w/ 5 second hold                 Blank cell = exercise not performed today    PATIENT EDUCATION:  Education details: HEP, osteoarthritis, referred pain, and benefits of exercise Person educated: Patient Education method: Explanation Education comprehension: verbalized understanding  HOME EXERCISE PROGRAM: SKTC  [[id number]]  SINGLE KNEE TO CHEST STRETCH - SKTC -  Repeat 3 Repetitions, Hold 1 Minute, Complete 1 Set, Perform 3 Times a Day  DOUBLE KNEE TO CHEST STRETCH - DKTC -  Repeat 2 Repetitions, Hold 1 Minute, Complete 1 Set, Perform 3 Times a Day  Bridges -  Repeat 15 Repetitions, Hold 2 Seconds, Complete 2 Sets, Perform 2 Times a Day  06/09/24: [id number]  ASSESSMENT:  CLINICAL IMPRESSION: Pt arrives for today's treatment session denying any pain.  Pt does report minimal tingling in left lateral quad, but not much.  Pt able to decrease ODI to 16/50, meeting his STG and making good progress towards his LTG.  Pt states that his low back pain seldom get above 7/10 (approximately 2 times a month).  In depth discussion/education provided for patient on the importance of activity planning and energy conservation. Pt also encouraged to concentrate on proper body mechanics with work life and every day life as well.  Lumbar traction max increased to 80# today without issue.  Pt declined estim today, stating he did not feel that he needs it today.  Pt denied any pain at completion of today's treatment session.    OBJECTIVE IMPAIRMENTS: decreased activity tolerance, decreased mobility, decreased ROM, decreased strength, hypomobility, impaired tone, and pain.   ACTIVITY LIMITATIONS: carrying, lifting, sitting, and locomotion level  PARTICIPATION LIMITATIONS: shopping, community activity, and  yard work  PERSONAL FACTORS: Past/current experiences and 3+ comorbidities: Hypertension, abdominal aortic aneurysm, arthritis, and allergies are also affecting patient's functional outcome.   REHAB POTENTIAL: Good  CLINICAL DECISION MAKING: Evolving/moderate complexity  EVALUATION COMPLEXITY: Moderate   GOALS: Goals reviewed with patient? Yes  SHORT TERM GOALS: Target date: 06/21/24  Patient will be independent with his initial HEP.  Baseline: Goal status: MET  2.  Patient will be able to complete his daily activities without his familiar pain exceeding 7/10. Baseline:  Goal status: MET  3.  Patient will improve his ODI score to 46% (23/50) disability or less for improved perceived function with his daily activities.  Baseline: 9/22: 16/50  Goal status: MET  LONG TERM GOALS: Target date: 07/12/24  Patient will be independent with advanced HEP.  Baseline:  Goal status: IN PROGRESS  2.  Patient will be able to complete his daily activities without his familiar pain exceeding 4/10. Baseline:  Goal status: IN PROGRESS  3.  Patient will improve his ODI score to 30% (15/50) disability or less for improved perceived function with his daily activities.  Baseline: 9/22: 16/50 Goal status: IN PROGRESS  4.  Patient will improve his left hamstring strength to at least 4+/5 for improved function with his daily activities.  Baseline:  Goal status: IN PROGRESS  5.  Patient will improve his lumbar flexion to at least 45 degrees for improved function picking up items from the floor.  Baseline:  Goal status: IN PROGRESS  PLAN:  PT FREQUENCY: 1-2x/week  PT DURATION: 6 weeks  PLANNED INTERVENTIONS: 97164- PT Re-evaluation, 97750- Physical Performance Testing, 97110-Therapeutic exercises, 97530- Therapeutic activity, [id number]- Neuromuscular re-education, 97535- Self Care, 02859- Manual therapy, G0283- Electrical stimulation (unattended), [phone]- Traction (mechanical), 20560 (1-2  muscles), 20561 (3+ muscles)- Dry Needling, Patient/Family education, Balance training, Joint mobilization, Spinal mobilization, Cryotherapy, and Moist heat.  PLAN FOR NEXT SESSION: Nustep, lumbar and lower extremity strengthening, review and update HEP, and modalities as needed   Delon DELENA Gosling, PTA 06/28/2024, 8:56 AM

## 2024-07-05 ENCOUNTER — Ambulatory Visit

## 2024-07-05 DIAGNOSIS — M25552 Pain in left hip: Secondary | ICD-10-CM

## 2024-07-05 DIAGNOSIS — M5416 Radiculopathy, lumbar region: Secondary | ICD-10-CM | POA: Diagnosis not present

## 2024-07-05 DIAGNOSIS — M79652 Pain in left thigh: Secondary | ICD-10-CM

## 2024-07-05 NOTE — Therapy (Signed)
 OUTPATIENT PHYSICAL THERAPY THORACOLUMBAR TREATMENT   Patient Name: James Davenport MRN: [id number] DOB:1956-01-30, 68 y.o., male Today's Date: 07/05/2024  END OF SESSION:  PT End of Session - 07/05/24 0804     Visit Number 7    Number of Visits 12    Date for Recertification  08/06/24    Authorization - Number of Visits 10    PT Start Time 0800    PT Stop Time 0859    PT Time Calculation (min) 59 min           Past Medical History:  Diagnosis Date   AAA (abdominal aortic aneurysm) without rupture 09/19/2021   Allergy    Aortic atherosclerosis    Arthritis 11/24/2020   Bilateral carpal tunnel syndrome 11/24/2020   Chronic left shoulder pain 11/24/2020   GERD (gastroesophageal reflux disease) 11/24/2020   Hyperlipidemia LDL goal <100 01/31/2016   Hypertension    Leg cramps 11/24/2020   Ulcer    Past Surgical History:  Procedure Laterality Date   carple tunnel Left 01/2024   ELBOW SURGERY  01/2024   LEG SURGERY Right 1975   Patient Active Problem List   Diagnosis Date Noted   Class 1 obesity due to excess calories with serious comorbidity and body mass index (BMI) of 30.0 to 30.9 in adult 11/19/2023   Primary hypertension 08/11/2023   Statin intolerance 02/05/2023   Chronic midline low back pain with left-sided sciatica 02/05/2023   Allergic reaction to plant, excluding food 02/05/2023   Bee sting allergy 01/23/2022   AAA (abdominal aortic aneurysm) without rupture 09/19/2021   Shortness of breath 08/13/2021   Seasonal allergies 01/09/2021   Seborrheic dermatitis 01/09/2021   History of colon polyps 01/09/2021   Aortic atherosclerosis    GERD (gastroesophageal reflux disease) 11/24/2020   Arthritis 11/24/2020   Leg cramps 11/24/2020   Bilateral carpal tunnel syndrome 11/24/2020   Chronic left shoulder pain 11/24/2020   Hyperlipidemia LDL goal <100 01/31/2016   REFERRING PROVIDER: Joesph Annabella HERO, FNP   REFERRING DIAG: Lumbar radiculopathy    Rationale for Evaluation and Treatment: Rehabilitation  THERAPY DIAG:  Radiculopathy, lumbar region  Pain in left thigh  Pain in left hip  ONSET DATE: 3 weeks ago  SUBJECTIVE:                                                                                                                                                                                           SUBJECTIVE STATEMENT: Pt reports 2/10 low back pain today, already taken pain meds.  Pt states that he was on the tractor a lot over the weekend.  PERTINENT HISTORY:  Hypertension, abdominal aortic aneurysm, arthritis, and allergies  PAIN:  Are you having pain? Yes: NPRS scale: 2/10 Pain location: low back  PRECAUTIONS: None  RED FLAGS: Abdominal aneurysm: Yes: as noted in his medical record   WEIGHT BEARING RESTRICTIONS: No  FALLS:  Has patient fallen in last 6 months? No  LIVING ENVIRONMENT: Lives with: lives with an adult companion Lives in: House/apartment Stairs: Yes: External: 6 steps; can reach both; reciprocal pattern Has following equipment at home: None  OCCUPATION: retired  PLOF: Independent  PATIENT GOALS: reduced pain, and improved strength  NEXT MD VISIT: 11/23/23  OBJECTIVE:  Note: Objective measures were completed at Evaluation unless otherwise noted.  PATIENT SURVEYS:  Modified Oswestry:  MODIFIED OSWESTRY DISABILITY SCALE  Date: 05/31/24 Score  Pain intensity 3 =  Pain medication provides me with moderate relief from pain.  2. Personal care (washing, dressing, etc.) 2 =  It is painful to take care of myself, and I am slow and careful.  3. Lifting 3 = Pain prevents me from lifting heavy weights, but I can manage (5) I have hardly any social life because of my pain. light to medium weights if they are conveniently positioned  4. Walking 3 =  Pain prevents me from walking more than  mile.  5. Sitting 3 =  Pain prevents me from sitting more than  hour.  6. Standing 4 =  Pain prevents  me from standing more than 10 minutes.  7. Sleeping 3 =  Even when I take pain medication, I sleep less than 4 hours.  8. Social Life 3 =  Pain prevents me from going out very often.  9. Traveling 4 = My pain restricts my travel to short necessary journeys under 1/2 hour.  10. Employment/ Homemaking 2 = I can perform most of my homemaking/job duties, but pain prevents me from performing more physically stressful activities (eg, lifting, vacuuming).  Total 30/50   Interpretation of scores: Score Category Description  0-20% Minimal Disability The patient can cope with most living activities. Usually no treatment is indicated apart from advice on lifting, sitting and exercise  21-40% Moderate Disability The patient experiences more pain and difficulty with sitting, lifting and standing. Travel and social life are more difficult and they may be disabled from work. Personal care, sexual activity and sleeping are not grossly affected, and the patient can usually be managed by conservative means  41-60% Severe Disability Pain remains the main problem in this group, but activities of daily living are affected. These patients require a detailed investigation  61-80% Crippled Back pain impinges on all aspects of the patient's life. Positive intervention is required  81-100% Bed-bound  These patients are either bed-bound or exaggerating their symptoms  Bluford FORBES Zoe DELENA Karon DELENA, et al. Surgery versus conservative management of stable thoracolumbar fracture: the PRESTO feasibility RCT. [place] ([place]): [employer]; 2021 Nov. [facility] Technology Assessment, No. 25.62.) Appendix 3, Oswestry Disability Index category descriptors. Available from: [employer]  Minimally Clinically Important Difference (MCID) = 12.8%  COGNITION: Overall cognitive status: Within functional limits for tasks assessed     SENSATION: Patient reports minimal tingling at left IT  band  POSTURE: forward head  PALPATION: TTP: bilateral lumbar paraspinals (L>R), TFL, IT band, hamstrings, and gastrocnemius  LUMBAR JOINT MOBILITY:  L1-4: WFL and nonpainful   L4-S1: hypomobile and painful  LUMBAR ROM:   AROM eval  Flexion 30; uncomfortable   Extension 24  Right lateral flexion [facility]  Left lateral flexion WFL   Right rotation 25% limited; familiar LLE pain  Left rotation WFL   (Blank rows = not tested)  LOWER EXTREMITY ROM: WFL for activities assessed  LOWER EXTREMITY MMT:    MMT Right eval Left eval  Hip flexion 4+/5 4/5  Hip extension    Hip abduction    Hip adduction    Hip internal rotation    Hip external rotation    Knee flexion 4+/5 4-/5; familiar LLE pain  Knee extension 5/5 5/5; tight feeling in left low back  Ankle dorsiflexion 4/5 4/5; familiar LLE pain  Ankle plantarflexion    Ankle inversion    Ankle eversion     (Blank rows = not tested)  GAIT: Assistive device utilized: None Level of assistance: Complete Independence   TREATMENT DATE:    07-05-24 Nustep  x 15 mins Lvl 6  Traction: lumbar, 85#/5# max/min; 99/5 on/off time, 15 mins mins IFC x 15 mins 80-150hz  to LB RT side lying  06/14/24    EXERCISE LOG  Exercise Repetitions and Resistance Comments  Recumbent bike  Lvl 15 - 19 x 16 minutes   Rocker board  5 minutes    Forward Step Ups 6 box x 5 mins alternating   Standing open books   For lumbar rotation  Self lumbar joint mobilization  With belt       Blank cell = exercise not performed today   Modalities  Date:  Traction: lumbar, 75#/5# max/min; 99/5 on/off time, 15 mins mins  06/02/24:     EXERCISE LOG  Exercise Repetitions and Resistance Comments  Nustep Level 3 x 12 minutes   SKTC 1 minute holds bilateral 2 sets   DKTC 1 minute holds bilateral 2 sets   Hip bridges 2 x 10        Right sdly position with pillow knees:  STW/M x 9 minutes to left LB musculature f/b IFC at 80-150 Hz on 40% scan x 20 minutes.                                                                                                                                                            05/31/24  EXERCISE LOG  Exercise Repetitions and Resistance Comments  Hamstring stretch  3 x 30 second hold    Hamstring set  10 reps w/ 5 second hold                 Blank cell = exercise not performed today    PATIENT EDUCATION:  Education details: HEP, osteoarthritis, referred pain, and benefits of exercise Person educated: Patient Education method: Explanation Education comprehension: verbalized understanding  HOME EXERCISE PROGRAM: SKTC  [[id number]]  SINGLE KNEE TO CHEST STRETCH - SKTC -  Repeat 3 Repetitions, Hold 1 Minute, Complete  1 Set, Perform 3 Times a Day  DOUBLE KNEE TO CHEST STRETCH - DKTC -  Repeat 2 Repetitions, Hold 1 Minute, Complete 1 Set, Perform 3 Times a Day  Bridges -  Repeat 15 Repetitions, Hold 2 Seconds, Complete 2 Sets, Perform 2 Times a Day  06/09/24: [id number]  ASSESSMENT:  CLINICAL IMPRESSION: Pt arrives for today's treatment session reporting 2/10 low back pain.  Pt states that he was on the tractor a lot this weekend which has caused his pain to increase.  Traction max weight increased to 85 pounds today.  Normal responses to all modalities performed today.  Pt denied any pain at completion of today's treatment session.   OBJECTIVE IMPAIRMENTS: decreased activity tolerance, decreased mobility, decreased ROM, decreased strength, hypomobility, impaired tone, and pain.   ACTIVITY LIMITATIONS: carrying, lifting, sitting, and locomotion level  PARTICIPATION LIMITATIONS: shopping, community activity, and yard work  PERSONAL FACTORS: Past/current experiences and 3+ comorbidities: Hypertension, abdominal aortic aneurysm, arthritis, and allergies are also affecting patient's functional outcome.   REHAB POTENTIAL: Good  CLINICAL DECISION MAKING: Evolving/moderate complexity  EVALUATION COMPLEXITY:  Moderate   GOALS: Goals reviewed with patient? Yes  SHORT TERM GOALS: Target date: 06/21/24  Patient will be independent with his initial HEP.  Baseline: Goal status: MET  2.  Patient will be able to complete his daily activities without his familiar pain exceeding 7/10. Baseline:  Goal status: MET  3.  Patient will improve his ODI score to 46% (23/50) disability or less for improved perceived function with his daily activities.  Baseline: 9/22: 16/50  Goal status: MET  LONG TERM GOALS: Target date: 07/12/24  Patient will be independent with advanced HEP.  Baseline:  Goal status: IN PROGRESS  2.  Patient will be able to complete his daily activities without his familiar pain exceeding 4/10. Baseline:  Goal status: IN PROGRESS  3.  Patient will improve his ODI score to 30% (15/50) disability or less for improved perceived function with his daily activities.  Baseline: 9/22: 16/50 Goal status: IN PROGRESS  4.  Patient will improve his left hamstring strength to at least 4+/5 for improved function with his daily activities.  Baseline:  Goal status: IN PROGRESS  5.  Patient will improve his lumbar flexion to at least 45 degrees for improved function picking up items from the floor.  Baseline:  Goal status: IN PROGRESS  PLAN:  PT FREQUENCY: 1-2x/week  PT DURATION: 6 weeks  PLANNED INTERVENTIONS: 97164- PT Re-evaluation, 97750- Physical Performance Testing, 97110-Therapeutic exercises, 97530- Therapeutic activity, [id number]- Neuromuscular re-education, 97535- Self Care, 02859- Manual therapy, G0283- Electrical stimulation (unattended), [id number]- Traction (mechanical), 20560 (1-2 muscles), 20561 (3+ muscles)- Dry Needling, Patient/Family education, Balance training, Joint mobilization, Spinal mobilization, Cryotherapy, and Moist heat.  PLAN FOR NEXT SESSION: Nustep, lumbar and lower extremity strengthening, review and update HEP, and modalities as needed   James DELENA Davenport,  PTA 07/05/2024, 9:03 AM

## 2024-07-07 DIAGNOSIS — H5213 Myopia, bilateral: Secondary | ICD-10-CM | POA: Diagnosis not present

## 2024-07-07 DIAGNOSIS — H52223 Regular astigmatism, bilateral: Secondary | ICD-10-CM | POA: Diagnosis not present

## 2024-07-07 DIAGNOSIS — H524 Presbyopia: Secondary | ICD-10-CM | POA: Diagnosis not present

## 2024-07-08 DIAGNOSIS — H5213 Myopia, bilateral: Secondary | ICD-10-CM | POA: Diagnosis not present

## 2024-07-08 DIAGNOSIS — H52223 Regular astigmatism, bilateral: Secondary | ICD-10-CM | POA: Diagnosis not present

## 2024-07-11 ENCOUNTER — Other Ambulatory Visit: Payer: Self-pay | Admitting: Family Medicine

## 2024-07-11 DIAGNOSIS — E785 Hyperlipidemia, unspecified: Secondary | ICD-10-CM

## 2024-07-12 ENCOUNTER — Ambulatory Visit: Attending: Family Medicine

## 2024-07-12 DIAGNOSIS — M25552 Pain in left hip: Secondary | ICD-10-CM | POA: Insufficient documentation

## 2024-07-12 DIAGNOSIS — M5416 Radiculopathy, lumbar region: Secondary | ICD-10-CM | POA: Diagnosis not present

## 2024-07-12 DIAGNOSIS — M79652 Pain in left thigh: Secondary | ICD-10-CM | POA: Diagnosis not present

## 2024-07-12 NOTE — Therapy (Signed)
 OUTPATIENT PHYSICAL THERAPY THORACOLUMBAR TREATMENT   Patient Name: James Davenport MRN: [id number] DOB:16-Jul-1956, 68 y.o., male Today's Date: 07/12/2024  END OF SESSION:  PT End of Session - 07/12/24 0811     Visit Number 8    Number of Visits 12    Date for Recertification  08/06/24    Authorization - Number of Visits 10    PT Start Time 0800           Past Medical History:  Diagnosis Date   AAA (abdominal aortic aneurysm) without rupture 09/19/2021   Allergy    Aortic atherosclerosis    Arthritis 11/24/2020   Bilateral carpal tunnel syndrome 11/24/2020   Chronic left shoulder pain 11/24/2020   GERD (gastroesophageal reflux disease) 11/24/2020   Hyperlipidemia LDL goal <100 01/31/2016   Hypertension    Leg cramps 11/24/2020   Ulcer    Past Surgical History:  Procedure Laterality Date   carple tunnel Left 01/2024   ELBOW SURGERY  01/2024   LEG SURGERY Right 1975   Patient Active Problem List   Diagnosis Date Noted   Class 1 obesity due to excess calories with serious comorbidity and body mass index (BMI) of 30.0 to 30.9 in adult 11/19/2023   Primary hypertension 08/11/2023   Statin intolerance 02/05/2023   Chronic midline low back pain with left-sided sciatica 02/05/2023   Allergic reaction to plant, excluding food 02/05/2023   Bee sting allergy 01/23/2022   AAA (abdominal aortic aneurysm) without rupture 09/19/2021   Shortness of breath 08/13/2021   Seasonal allergies 01/09/2021   Seborrheic dermatitis 01/09/2021   History of colon polyps 01/09/2021   Aortic atherosclerosis    GERD (gastroesophageal reflux disease) 11/24/2020   Arthritis 11/24/2020   Leg cramps 11/24/2020   Bilateral carpal tunnel syndrome 11/24/2020   Chronic left shoulder pain 11/24/2020   Hyperlipidemia LDL goal <100 01/31/2016   REFERRING PROVIDER: Joesph Annabella HERO, FNP   REFERRING DIAG: Lumbar radiculopathy   Rationale for Evaluation and Treatment:  Rehabilitation  THERAPY DIAG:  Radiculopathy, lumbar region  Pain in left thigh  ONSET DATE: 3 weeks ago  SUBJECTIVE:                                                                                                                                                                                           SUBJECTIVE STATEMENT: Pt reports 5/10 low back pain today.  Pt states pain greater than 10/10 yesterday.  Pain medication has given some relief.   PERTINENT HISTORY:  Hypertension, abdominal aortic aneurysm, arthritis, and allergies  PAIN:  Are you having pain? Yes: NPRS scale: 5/10 Pain location: low  back  PRECAUTIONS: None  RED FLAGS: Abdominal aneurysm: Yes: as noted in his medical record   WEIGHT BEARING RESTRICTIONS: No  FALLS:  Has patient fallen in last 6 months? No  LIVING ENVIRONMENT: Lives with: lives with an adult companion Lives in: House/apartment Stairs: Yes: External: 6 steps; can reach both; reciprocal pattern Has following equipment at home: None  OCCUPATION: retired  PLOF: Independent  PATIENT GOALS: reduced pain, and improved strength  NEXT MD VISIT: 11/23/23  OBJECTIVE:  Note: Objective measures were completed at Evaluation unless otherwise noted.  PATIENT SURVEYS:  Modified Oswestry:  MODIFIED OSWESTRY DISABILITY SCALE  Date: 05/31/24 Score  Pain intensity 3 =  Pain medication provides me with moderate relief from pain.  2. Personal care (washing, dressing, etc.) 2 =  It is painful to take care of myself, and I am slow and careful.  3. Lifting 3 = Pain prevents me from lifting heavy weights, but I can manage (5) I have hardly any social life because of my pain. light to medium weights if they are conveniently positioned  4. Walking 3 =  Pain prevents me from walking more than  mile.  5. Sitting 3 =  Pain prevents me from sitting more than  hour.  6. Standing 4 =  Pain prevents me from standing more than 10 minutes.  7. Sleeping 3 =  Even  when I take pain medication, I sleep less than 4 hours.  8. Social Life 3 =  Pain prevents me from going out very often.  9. Traveling 4 = My pain restricts my travel to short necessary journeys under 1/2 hour.  10. Employment/ Homemaking 2 = I can perform most of my homemaking/job duties, but pain prevents me from performing more physically stressful activities (eg, lifting, vacuuming).  Total 30/50   Interpretation of scores: Score Category Description  0-20% Minimal Disability The patient can cope with most living activities. Usually no treatment is indicated apart from advice on lifting, sitting and exercise  21-40% Moderate Disability The patient experiences more pain and difficulty with sitting, lifting and standing. Travel and social life are more difficult and they may be disabled from work. Personal care, sexual activity and sleeping are not grossly affected, and the patient can usually be managed by conservative means  41-60% Severe Disability Pain remains the main problem in this group, but activities of daily living are affected. These patients require a detailed investigation  61-80% Crippled Back pain impinges on all aspects of the patient's life. Positive intervention is required  81-100% Bed-bound  These patients are either bed-bound or exaggerating their symptoms  Bluford FORBES Zoe DELENA Karon DELENA, et al. Surgery versus conservative management of stable thoracolumbar fracture: the PRESTO feasibility RCT. [place] ([place]): [employer]; 2021 Nov. [facility] Technology Assessment, No. 25.62.) Appendix 3, Oswestry Disability Index category descriptors. Available from: [employer]cz  Minimally Clinically Important Difference (MCID) = 12.8%  COGNITION: Overall cognitive status: Within functional limits for tasks assessed     SENSATION: Patient reports minimal tingling at left IT band  POSTURE: forward head  PALPATION: TTP: bilateral lumbar  paraspinals (L>R), TFL, IT band, hamstrings, and gastrocnemius  LUMBAR JOINT MOBILITY:  L1-4: WFL and nonpainful   L4-S1: hypomobile and painful  LUMBAR ROM:   AROM eval  Flexion 30; uncomfortable   Extension 24  Right lateral flexion WFL   Left lateral flexion WFL   Right rotation 25% limited; familiar LLE pain  Left rotation WFL   (Blank rows =  not tested)  LOWER EXTREMITY ROM: WFL for activities assessed  LOWER EXTREMITY MMT:    MMT Right eval Left eval  Hip flexion 4+/5 4/5  Hip extension    Hip abduction    Hip adduction    Hip internal rotation    Hip external rotation    Knee flexion 4+/5 4-/5; familiar LLE pain  Knee extension 5/5 5/5; tight feeling in left low back  Ankle dorsiflexion 4/5 4/5; familiar LLE pain  Ankle plantarflexion    Ankle inversion    Ankle eversion     (Blank rows = not tested)  GAIT: Assistive device utilized: None Level of assistance: Complete Independence   TREATMENT DATE:    07-12-24  Traction: lumbar, 85#/5# max/min; 99/5 on/off time, 15 mins mins IFC x 15 mins 80-150hz  to LB RT side lying  06/14/24    EXERCISE LOG  Exercise Repetitions and Resistance Comments  Recumbent bike  Lvl 15 - 19 x 16 minutes   Rocker board  5 minutes    Forward Step Ups 6 box x 5 mins alternating   Standing open books   For lumbar rotation  Self lumbar joint mobilization  With belt       Blank cell = exercise not performed today   Modalities  Date:  Traction: lumbar, 75#/5# max/min; 99/5 on/off time, 15 mins mins  06/02/24:     EXERCISE LOG  Exercise Repetitions and Resistance Comments  Nustep Level 3 x 12 minutes   SKTC 1 minute holds bilateral 2 sets   DKTC 1 minute holds bilateral 2 sets   Hip bridges 2 x 10        Right sdly position with pillow knees:  STW/M x 9 minutes to left LB musculature f/b IFC at 80-150 Hz on 40% scan x 20 minutes.                                                                                                                                                            05/31/24  EXERCISE LOG  Exercise Repetitions and Resistance Comments  Hamstring stretch  3 x 30 second hold    Hamstring set  10 reps w/ 5 second hold                 Blank cell = exercise not performed today    PATIENT EDUCATION:  Education details: HEP, osteoarthritis, referred pain, and benefits of exercise Person educated: Patient Education method: Explanation Education comprehension: verbalized understanding  HOME EXERCISE PROGRAM: SKTC  [[id number]]  SINGLE KNEE TO CHEST STRETCH - SKTC -  Repeat 3 Repetitions, Hold 1 Minute, Complete 1 Set, Perform 3 Times a Day  DOUBLE KNEE TO CHEST STRETCH - DKTC -  Repeat 2 Repetitions, Hold 1 Minute, Complete 1 Set, Perform 3 Times  a Day  Bridges -  Repeat 15 Repetitions, Hold 2 Seconds, Complete 2 Sets, Perform 2 Times a Day  06/09/24: [id number]  ASSESSMENT:  CLINICAL IMPRESSION: Pt arrives for today's treatment session reporting 5/10 low back pain.  Pt states that pain was greater than 10/10 last night.  Pt also complains of leg cramps this morning and opts to forgo Nustep this morning as he has ambulated a lot this morning already.  Traction parameters unchanged today.  Normal responses to estim and MH noted upon removal.  Pt reported 0/10 low back pain at completion of today's treatment session.  OBJECTIVE IMPAIRMENTS: decreased activity tolerance, decreased mobility, decreased ROM, decreased strength, hypomobility, impaired tone, and pain.   ACTIVITY LIMITATIONS: carrying, lifting, sitting, and locomotion level  PARTICIPATION LIMITATIONS: shopping, community activity, and yard work  PERSONAL FACTORS: Past/current experiences and 3+ comorbidities: Hypertension, abdominal aortic aneurysm, arthritis, and allergies are also affecting patient's functional outcome.   REHAB POTENTIAL: Good  CLINICAL DECISION MAKING: Evolving/moderate complexity  EVALUATION COMPLEXITY:  Moderate   GOALS: Goals reviewed with patient? Yes  SHORT TERM GOALS: Target date: 06/21/24  Patient will be independent with his initial HEP.  Baseline: Goal status: MET  2.  Patient will be able to complete his daily activities without his familiar pain exceeding 7/10. Baseline:  Goal status: MET  3.  Patient will improve his ODI score to 46% (23/50) disability or less for improved perceived function with his daily activities.  Baseline: 9/22: 16/50  Goal status: MET  LONG TERM GOALS: Target date: 07/12/24  Patient will be independent with advanced HEP.  Baseline:  Goal status: IN PROGRESS  2.  Patient will be able to complete his daily activities without his familiar pain exceeding 4/10. Baseline:  Goal status: IN PROGRESS  3.  Patient will improve his ODI score to 30% (15/50) disability or less for improved perceived function with his daily activities.  Baseline: 9/22: 16/50 Goal status: IN PROGRESS  4.  Patient will improve his left hamstring strength to at least 4+/5 for improved function with his daily activities.  Baseline:  Goal status: IN PROGRESS  5.  Patient will improve his lumbar flexion to at least 45 degrees for improved function picking up items from the floor.  Baseline:  Goal status: IN PROGRESS  PLAN:  PT FREQUENCY: 1-2x/week  PT DURATION: 6 weeks  PLANNED INTERVENTIONS: 97164- PT Re-evaluation, 97750- Physical Performance Testing, 97110-Therapeutic exercises, 97530- Therapeutic activity, [id number]- Neuromuscular re-education, 97535- Self Care, 02859- Manual therapy, G0283- Electrical stimulation (unattended), [phone]- Traction (mechanical), 20560 (1-2 muscles), 20561 (3+ muscles)- Dry Needling, Patient/Family education, Balance training, Joint mobilization, Spinal mobilization, Cryotherapy, and Moist heat.  PLAN FOR NEXT SESSION: Nustep, lumbar and lower extremity strengthening, review and update HEP, and modalities as needed   Delon DELENA Gosling,  PTA 07/12/2024, 8:16 AM

## 2024-07-19 ENCOUNTER — Ambulatory Visit

## 2024-07-19 DIAGNOSIS — M79652 Pain in left thigh: Secondary | ICD-10-CM

## 2024-07-19 DIAGNOSIS — M25552 Pain in left hip: Secondary | ICD-10-CM

## 2024-07-19 DIAGNOSIS — M5416 Radiculopathy, lumbar region: Secondary | ICD-10-CM | POA: Diagnosis not present

## 2024-07-19 NOTE — Therapy (Signed)
 OUTPATIENT PHYSICAL THERAPY THORACOLUMBAR TREATMENT   Patient Name: James Davenport MRN: [id number] DOB:27-Mar-1956, 68 y.o., male Today's Date: 07/19/2024  END OF SESSION:  PT End of Session - 07/19/24 0804     Visit Number 9    Number of Visits 12    Date for Recertification  08/06/24    Authorization - Number of Visits 10    PT Start Time 0800    PT Stop Time 0840    PT Time Calculation (min) 40 min           Past Medical History:  Diagnosis Date   AAA (abdominal aortic aneurysm) without rupture 09/19/2021   Allergy    Aortic atherosclerosis    Arthritis 11/24/2020   Bilateral carpal tunnel syndrome 11/24/2020   Chronic left shoulder pain 11/24/2020   GERD (gastroesophageal reflux disease) 11/24/2020   Hyperlipidemia LDL goal <100 01/31/2016   Hypertension    Leg cramps 11/24/2020   Ulcer    Past Surgical History:  Procedure Laterality Date   carple tunnel Left 01/2024   ELBOW SURGERY  01/2024   LEG SURGERY Right 1975   Patient Active Problem List   Diagnosis Date Noted   Class 1 obesity due to excess calories with serious comorbidity and body mass index (BMI) of 30.0 to 30.9 in adult 11/19/2023   Primary hypertension 08/11/2023   Statin intolerance 02/05/2023   Chronic midline low back pain with left-sided sciatica 02/05/2023   Allergic reaction to plant, excluding food 02/05/2023   Bee sting allergy 01/23/2022   AAA (abdominal aortic aneurysm) without rupture 09/19/2021   Shortness of breath 08/13/2021   Seasonal allergies 01/09/2021   Seborrheic dermatitis 01/09/2021   History of colon polyps 01/09/2021   Aortic atherosclerosis    GERD (gastroesophageal reflux disease) 11/24/2020   Arthritis 11/24/2020   Leg cramps 11/24/2020   Bilateral carpal tunnel syndrome 11/24/2020   Chronic left shoulder pain 11/24/2020   Hyperlipidemia LDL goal <100 01/31/2016   REFERRING PROVIDER: Joesph Annabella HERO, FNP   REFERRING DIAG: Lumbar radiculopathy    Rationale for Evaluation and Treatment: Rehabilitation  THERAPY DIAG:  Radiculopathy, lumbar region  Pain in left thigh  Pain in left hip  ONSET DATE: 3 weeks ago  SUBJECTIVE:                                                                                                                                                                                           SUBJECTIVE STATEMENT: Pt reports 2/10 low back pain today, feeling better.   PERTINENT HISTORY:  Hypertension, abdominal aortic aneurysm, arthritis, and allergies  PAIN:  Are you having  pain? Yes: NPRS scale: 2/10 Pain location: low back  PRECAUTIONS: None  RED FLAGS: Abdominal aneurysm: Yes: as noted in his medical record   WEIGHT BEARING RESTRICTIONS: No  FALLS:  Has patient fallen in last 6 months? No  LIVING ENVIRONMENT: Lives with: lives with an adult companion Lives in: House/apartment Stairs: Yes: External: 6 steps; can reach both; reciprocal pattern Has following equipment at home: None  OCCUPATION: retired  PLOF: Independent  PATIENT GOALS: reduced pain, and improved strength  NEXT MD VISIT: 11/23/23  OBJECTIVE:  Note: Objective measures were completed at Evaluation unless otherwise noted.  PATIENT SURVEYS:  Modified Oswestry:  MODIFIED OSWESTRY DISABILITY SCALE  Date: 05/31/24 Score  Pain intensity 3 =  Pain medication provides me with moderate relief from pain.  2. Personal care (washing, dressing, etc.) 2 =  It is painful to take care of myself, and I am slow and careful.  3. Lifting 3 = Pain prevents me from lifting heavy weights, but I can manage (5) I have hardly any social life because of my pain. light to medium weights if they are conveniently positioned  4. Walking 3 =  Pain prevents me from walking more than  mile.  5. Sitting 3 =  Pain prevents me from sitting more than  hour.  6. Standing 4 =  Pain prevents me from standing more than 10 minutes.  7. Sleeping 3 =  Even when I  take pain medication, I sleep less than 4 hours.  8. Social Life 3 =  Pain prevents me from going out very often.  9. Traveling 4 = My pain restricts my travel to short necessary journeys under 1/2 hour.  10. Employment/ Homemaking 2 = I can perform most of my homemaking/job duties, but pain prevents me from performing more physically stressful activities (eg, lifting, vacuuming).  Total 30/50   Interpretation of scores: Score Category Description  0-20% Minimal Disability The patient can cope with most living activities. Usually no treatment is indicated apart from advice on lifting, sitting and exercise  21-40% Moderate Disability The patient experiences more pain and difficulty with sitting, lifting and standing. Travel and social life are more difficult and they may be disabled from work. Personal care, sexual activity and sleeping are not grossly affected, and the patient can usually be managed by conservative means  41-60% Severe Disability Pain remains the main problem in this group, but activities of daily living are affected. These patients require a detailed investigation  61-80% Crippled Back pain impinges on all aspects of the patient's life. Positive intervention is required  81-100% Bed-bound  These patients are either bed-bound or exaggerating their symptoms  Bluford FORBES Zoe DELENA Karon DELENA, et al. Surgery versus conservative management of stable thoracolumbar fracture: the PRESTO feasibility RCT. [place] ([place]): [employer]; 2021 Nov. [facility] Technology Assessment, No. 25.62.) Appendix 3, Oswestry Disability Index category descriptors. Available from: [employer]  Minimally Clinically Important Difference (MCID) = 12.8%  COGNITION: Overall cognitive status: Within functional limits for tasks assessed     SENSATION: Patient reports minimal tingling at left IT band  POSTURE: forward head  PALPATION: TTP: bilateral lumbar paraspinals  (L>R), TFL, IT band, hamstrings, and gastrocnemius  LUMBAR JOINT MOBILITY:  L1-4: WFL and nonpainful   L4-S1: hypomobile and painful  LUMBAR ROM:   AROM eval  Flexion 30; uncomfortable   Extension 24  Right lateral flexion WFL   Left lateral flexion WFL   Right rotation 25% limited; familiar LLE pain  Left rotation WFL   (Blank rows = not tested)  LOWER EXTREMITY ROM: WFL for activities assessed  LOWER EXTREMITY MMT:    MMT Right eval Left eval  Hip flexion 4+/5 4/5  Hip extension    Hip abduction    Hip adduction    Hip internal rotation    Hip external rotation    Knee flexion 4+/5 4-/5; familiar LLE pain  Knee extension 5/5 5/5; tight feeling in left low back  Ankle dorsiflexion 4/5 4/5; familiar LLE pain  Ankle plantarflexion    Ankle inversion    Ankle eversion     (Blank rows = not tested)  GAIT: Assistive device utilized: None Level of assistance: Complete Independence   TREATMENT DATE:    07-19-24 Nustep Lvl 5 x 12 mins Traction: lumbar, 85#/5# max/min; 99/5 on/off time, 15 mins mins IFC x 15 mins 80-150hz  to LB RT side lying  06/14/24    EXERCISE LOG  Exercise Repetitions and Resistance Comments  Recumbent bike  Lvl 15 - 19 x 16 minutes   Rocker board  5 minutes    Forward Step Ups 6 box x 5 mins alternating   Standing open books   For lumbar rotation  Self lumbar joint mobilization  With belt       Blank cell = exercise not performed today   Modalities  Date:  Traction: lumbar, 75#/5# max/min; 99/5 on/off time, 15 mins mins  06/02/24:     EXERCISE LOG  Exercise Repetitions and Resistance Comments  Nustep Level 3 x 12 minutes   SKTC 1 minute holds bilateral 2 sets   DKTC 1 minute holds bilateral 2 sets   Hip bridges 2 x 10        Right sdly position with pillow knees:  STW/M x 9 minutes to left LB musculature f/b IFC at 80-150 Hz on 40% scan x 20 minutes.                                                                                                                                                            05/31/24  EXERCISE LOG  Exercise Repetitions and Resistance Comments  Hamstring stretch  3 x 30 second hold    Hamstring set  10 reps w/ 5 second hold                 Blank cell = exercise not performed today    PATIENT EDUCATION:  Education details: HEP, osteoarthritis, referred pain, and benefits of exercise Person educated: Patient Education method: Explanation Education comprehension: verbalized understanding  HOME EXERCISE PROGRAM: SKTC  [[id number]]  SINGLE KNEE TO CHEST STRETCH - SKTC -  Repeat 3 Repetitions, Hold 1 Minute, Complete 1 Set, Perform 3 Times a Day  DOUBLE KNEE TO CHEST STRETCH - DKTC -  Repeat 2 Repetitions, Hold 1 Minute, Complete 1 Set, Perform 3 Times a Day  Bridges -  Repeat 15 Repetitions, Hold 2 Seconds, Complete 2 Sets, Perform 2 Times a Day  06/09/24: [id number]  ASSESSMENT:  CLINICAL IMPRESSION: Pt arrives for today's treatment session reporting 2/10 low back pain.  Pt with continued benefits from lumbar traction.  Pt with be ready for discharge at next treatment session.  Pt denied any pain at completion of today's treatment session.  OBJECTIVE IMPAIRMENTS: decreased activity tolerance, decreased mobility, decreased ROM, decreased strength, hypomobility, impaired tone, and pain.   ACTIVITY LIMITATIONS: carrying, lifting, sitting, and locomotion level  PARTICIPATION LIMITATIONS: shopping, community activity, and yard work  PERSONAL FACTORS: Past/current experiences and 3+ comorbidities: Hypertension, abdominal aortic aneurysm, arthritis, and allergies are also affecting patient's functional outcome.   REHAB POTENTIAL: Good  CLINICAL DECISION MAKING: Evolving/moderate complexity  EVALUATION COMPLEXITY: Moderate   GOALS: Goals reviewed with patient? Yes  SHORT TERM GOALS: Target date: 06/21/24  Patient will be independent with his initial HEP.  Baseline: Goal status:  MET  2.  Patient will be able to complete his daily activities without his familiar pain exceeding 7/10. Baseline:  Goal status: MET  3.  Patient will improve his ODI score to 46% (23/50) disability or less for improved perceived function with his daily activities.  Baseline: 9/22: 16/50  Goal status: MET  LONG TERM GOALS: Target date: 07/12/24  Patient will be independent with advanced HEP.  Baseline:  Goal status: MET  2.  Patient will be able to complete his daily activities without his familiar pain exceeding 4/10. Baseline:  Goal status: IN PROGRESS  3.  Patient will improve his ODI score to 30% (15/50) disability or less for improved perceived function with his daily activities.  Baseline: 9/22: 16/50 Goal status: IN PROGRESS  4.  Patient will improve his left hamstring strength to at least 4+/5 for improved function with his daily activities.  Baseline:  Goal status: IN PROGRESS  5.  Patient will improve his lumbar flexion to at least 45 degrees for improved function picking up items from the floor.  Baseline:  Goal status: IN PROGRESS  PLAN:  PT FREQUENCY: 1-2x/week  PT DURATION: 6 weeks  PLANNED INTERVENTIONS: 97164- PT Re-evaluation, 97750- Physical Performance Testing, 97110-Therapeutic exercises, 97530- Therapeutic activity, [id number]- Neuromuscular re-education, 97535- Self Care, [id number]- Manual therapy, [id number]- Electrical stimulation (unattended), [phone]- Traction (mechanical), 20560 (1-2 muscles), 20561 (3+ muscles)- Dry Needling, Patient/Family education, Balance training, Joint mobilization, Spinal mobilization, Cryotherapy, and Moist heat.  PLAN FOR NEXT SESSION: Nustep, lumbar and lower extremity strengthening, review and update HEP, and modalities as needed   Delon DELENA Gosling, PTA 07/19/2024, 8:55 AM

## 2024-07-26 ENCOUNTER — Ambulatory Visit

## 2024-07-26 DIAGNOSIS — M25552 Pain in left hip: Secondary | ICD-10-CM | POA: Diagnosis not present

## 2024-07-26 DIAGNOSIS — M79652 Pain in left thigh: Secondary | ICD-10-CM | POA: Diagnosis not present

## 2024-07-26 DIAGNOSIS — M5416 Radiculopathy, lumbar region: Secondary | ICD-10-CM

## 2024-07-26 NOTE — Therapy (Addendum)
 OUTPATIENT PHYSICAL THERAPY THORACOLUMBAR TREATMENT   Patient Name: James Davenport MRN: [id number] DOB:Nov 26, 1955, 68 y.o., male Today's Date: 07/26/2024  END OF SESSION:  PT End of Session - 07/26/24 0810     Visit Number 10    Number of Visits 12    Date for Recertification  08/06/24    Authorization - Number of Visits 10    PT Start Time 0800    PT Stop Time 0835    PT Time Calculation (min) 35 min           Past Medical History:  Diagnosis Date   AAA (abdominal aortic aneurysm) without rupture 09/19/2021   Allergy    Aortic atherosclerosis    Arthritis 11/24/2020   Bilateral carpal tunnel syndrome 11/24/2020   Chronic left shoulder pain 11/24/2020   GERD (gastroesophageal reflux disease) 11/24/2020   Hyperlipidemia LDL goal <100 01/31/2016   Hypertension    Leg cramps 11/24/2020   Ulcer    Past Surgical History:  Procedure Laterality Date   carple tunnel Left 01/2024   ELBOW SURGERY  01/2024   LEG SURGERY Right 1975   Patient Active Problem List   Diagnosis Date Noted   Class 1 obesity due to excess calories with serious comorbidity and body mass index (BMI) of 30.0 to 30.9 in adult 11/19/2023   Primary hypertension 08/11/2023   Statin intolerance 02/05/2023   Chronic midline low back pain with left-sided sciatica 02/05/2023   Allergic reaction to plant, excluding food 02/05/2023   Bee sting allergy 01/23/2022   AAA (abdominal aortic aneurysm) without rupture 09/19/2021   Shortness of breath 08/13/2021   Seasonal allergies 01/09/2021   Seborrheic dermatitis 01/09/2021   History of colon polyps 01/09/2021   Aortic atherosclerosis    GERD (gastroesophageal reflux disease) 11/24/2020   Arthritis 11/24/2020   Leg cramps 11/24/2020   Bilateral carpal tunnel syndrome 11/24/2020   Chronic left shoulder pain 11/24/2020   Hyperlipidemia LDL goal <100 01/31/2016   REFERRING PROVIDER: Joesph Annabella HERO, FNP   REFERRING DIAG: Lumbar radiculopathy    Rationale for Evaluation and Treatment: Rehabilitation  THERAPY DIAG:  Radiculopathy, lumbar region  Pain in left thigh  Pain in left hip  ONSET DATE: 3 weeks ago  SUBJECTIVE:                                                                                                                                                                                           SUBJECTIVE STATEMENT: Pt denies any pain today.  Pt ready for discharge today.  PERTINENT HISTORY:  Hypertension, abdominal aortic aneurysm, arthritis, and allergies  PAIN:  Are you  having pain? No  PRECAUTIONS: None  RED FLAGS: Abdominal aneurysm: Yes: as noted in his medical record   WEIGHT BEARING RESTRICTIONS: No  FALLS:  Has patient fallen in last 6 months? No  LIVING ENVIRONMENT: Lives with: lives with an adult companion Lives in: House/apartment Stairs: Yes: External: 6 steps; can reach both; reciprocal pattern Has following equipment at home: None  OCCUPATION: retired  PLOF: Independent  PATIENT GOALS: reduced pain, and improved strength  NEXT MD VISIT: 11/23/23  OBJECTIVE:  Note: Objective measures were completed at Evaluation unless otherwise noted.  PATIENT SURVEYS:  Modified Oswestry:  MODIFIED OSWESTRY DISABILITY SCALE  Date: 05/31/24 Score  Pain intensity 3 =  Pain medication provides me with moderate relief from pain.  2. Personal care (washing, dressing, etc.) 2 =  It is painful to take care of myself, and I am slow and careful.  3. Lifting 3 = Pain prevents me from lifting heavy weights, but I can manage (5) I have hardly any social life because of my pain. light to medium weights if they are conveniently positioned  4. Walking 3 =  Pain prevents me from walking more than  mile.  5. Sitting 3 =  Pain prevents me from sitting more than  hour.  6. Standing 4 =  Pain prevents me from standing more than 10 minutes.  7. Sleeping 3 =  Even when I take pain medication, I sleep less than 4  hours.  8. Social Life 3 =  Pain prevents me from going out very often.  9. Traveling 4 = My pain restricts my travel to short necessary journeys under 1/2 hour.  10. Employment/ Homemaking 2 = I can perform most of my homemaking/job duties, but pain prevents me from performing more physically stressful activities (eg, lifting, vacuuming).  Total 30/50   Interpretation of scores: Score Category Description  0-20% Minimal Disability The patient can cope with most living activities. Usually no treatment is indicated apart from advice on lifting, sitting and exercise  21-40% Moderate Disability The patient experiences more pain and difficulty with sitting, lifting and standing. Travel and social life are more difficult and they may be disabled from work. Personal care, sexual activity and sleeping are not grossly affected, and the patient can usually be managed by conservative means  41-60% Severe Disability Pain remains the main problem in this group, but activities of daily living are affected. These patients require a detailed investigation  61-80% Crippled Back pain impinges on all aspects of the patient's life. Positive intervention is required  81-100% Bed-bound  These patients are either bed-bound or exaggerating their symptoms  Bluford FORBES Zoe DELENA Karon DELENA, et al. Surgery versus conservative management of stable thoracolumbar fracture: the PRESTO feasibility RCT. [place] ([place]): [employer]; 2021 Nov. [facility] Technology Assessment, No. 25.62.) Appendix 3, Oswestry Disability Index category descriptors. Available from: [employer]cz  Minimally Clinically Important Difference (MCID) = 12.8%  COGNITION: Overall cognitive status: Within functional limits for tasks assessed     SENSATION: Patient reports minimal tingling at left IT band  POSTURE: forward head  PALPATION: TTP: bilateral lumbar paraspinals (L>R), TFL, IT band, hamstrings, and  gastrocnemius  LUMBAR JOINT MOBILITY:  L1-4: WFL and nonpainful   L4-S1: hypomobile and painful  LUMBAR ROM:   AROM eval  Flexion 30; uncomfortable   Extension 24  Right lateral flexion WFL   Left lateral flexion WFL   Right rotation 25% limited; familiar LLE pain  Left rotation WFL   (Blank  rows = not tested)  LOWER EXTREMITY ROM: WFL for activities assessed  LOWER EXTREMITY MMT:    MMT Right eval Left eval  Hip flexion 4+/5 4/5  Hip extension    Hip abduction    Hip adduction    Hip internal rotation    Hip external rotation    Knee flexion 4+/5 4-/5; familiar LLE pain  Knee extension 5/5 5/5; tight feeling in left low back  Ankle dorsiflexion 4/5 4/5; familiar LLE pain  Ankle plantarflexion    Ankle inversion    Ankle eversion     (Blank rows = not tested)  GAIT: Assistive device utilized: None Level of assistance: Complete Independence   TREATMENT DATE:    07-26-24 Traction: lumbar, 85#/5# max/min; 99/5 on/off time, 15 mins mins IFC x 15 mins 80-150hz  to LB RT side lying  06/14/24    EXERCISE LOG  Exercise Repetitions and Resistance Comments  Recumbent bike  Lvl 15 - 19 x 16 minutes   Rocker board  5 minutes    Forward Step Ups 6 box x 5 mins alternating   Standing open books   For lumbar rotation  Self lumbar joint mobilization  With belt       Blank cell = exercise not performed today   Modalities  Date:  Traction: lumbar, 75#/5# max/min; 99/5 on/off time, 15 mins mins  06/02/24:     EXERCISE LOG  Exercise Repetitions and Resistance Comments  Nustep Level 3 x 12 minutes   SKTC 1 minute holds bilateral 2 sets   DKTC 1 minute holds bilateral 2 sets   Hip bridges 2 x 10        Right sdly position with pillow knees:  STW/M x 9 minutes to left LB musculature f/b IFC at 80-150 Hz on 40% scan x 20 minutes.                                                                                                                                                            05/31/24  EXERCISE LOG  Exercise Repetitions and Resistance Comments  Hamstring stretch  3 x 30 second hold    Hamstring set  10 reps w/ 5 second hold                 Blank cell = exercise not performed today    PATIENT EDUCATION:  Education details: HEP, osteoarthritis, referred pain, and benefits of exercise Person educated: Patient Education method: Explanation Education comprehension: verbalized understanding  HOME EXERCISE PROGRAM: SKTC  [[id number]]  SINGLE KNEE TO CHEST STRETCH - SKTC -  Repeat 3 Repetitions, Hold 1 Minute, Complete 1 Set, Perform 3 Times a Day  DOUBLE KNEE TO CHEST STRETCH - DKTC -  Repeat 2 Repetitions, Hold 1 Minute, Complete 1 Set, Perform 3  Times a Day  Bridges -  Repeat 15 Repetitions, Hold 2 Seconds, Complete 2 Sets, Perform 2 Times a Day  06/09/24: [id number]  ASSESSMENT:  CLINICAL IMPRESSION: Pt arrives for today's treatment session denying any pain.  Pt able to decrease ODI score to 15/50, meeting his ODI goal.  Pt reports that he is able to perform ADLs with average pain of 5/89.  Pt to pick an object up off the floor without difficulty.  Pt also able to demonstrate 5/5 left hamstring strength today as well.  Normal responses to traction noted upon completion.  Discussed benefits of inversion table with pt for home use.  Pt encouraged to call the facility with any questions or concerns.  Pt ready for discharge at this time.  Pt denied any pain at completion of today's treatment session.   OBJECTIVE IMPAIRMENTS: decreased activity tolerance, decreased mobility, decreased ROM, decreased strength, hypomobility, impaired tone, and pain.   ACTIVITY LIMITATIONS: carrying, lifting, sitting, and locomotion level  PARTICIPATION LIMITATIONS: shopping, community activity, and yard work  PERSONAL FACTORS: Past/current experiences and 3+ comorbidities: Hypertension, abdominal aortic aneurysm, arthritis, and allergies are also affecting patient's  functional outcome.   REHAB POTENTIAL: Good  CLINICAL DECISION MAKING: Evolving/moderate complexity  EVALUATION COMPLEXITY: Moderate   GOALS: Goals reviewed with patient? Yes  SHORT TERM GOALS: Target date: 06/21/24  Patient will be independent with his initial HEP.  Baseline: Goal status: MET  2.  Patient will be able to complete his daily activities without his familiar pain exceeding 7/10. Baseline:  Goal status: MET  3.  Patient will improve his ODI score to 46% (23/50) disability or less for improved perceived function with his daily activities.  Baseline: 9/22: 16/50  Goal status: MET  LONG TERM GOALS: Target date: 07/12/24  Patient will be independent with advanced HEP.  Baseline:  Goal status: MET  2.  Patient will be able to complete his daily activities without his familiar pain exceeding 4/10. Baseline:  Goal status: IN PROGRESS  3.  Patient will improve his ODI score to 30% (15/50) disability or less for improved perceived function with his daily activities.  Baseline: 9/22: 16/50 Goal status: IN PROGRESS  4.  Patient will improve his left hamstring strength to at least 4+/5 for improved function with his daily activities.  Baseline:  Goal status: IN PROGRESS  5.  Patient will improve his lumbar flexion to at least 45 degrees for improved function picking up items from the floor.  Baseline:  Goal status: IN PROGRESS  PLAN:  PT FREQUENCY: 1-2x/week  PT DURATION: 6 weeks  PLANNED INTERVENTIONS: 97164- PT Re-evaluation, 97750- Physical Performance Testing, 97110-Therapeutic exercises, 97530- Therapeutic activity, [id number]- Neuromuscular re-education, 97535- Self Care, 02859- Manual therapy, [id number]- Electrical stimulation (unattended), [phone]- Traction (mechanical), 20560 (1-2 muscles), 20561 (3+ muscles)- Dry Needling, Patient/Family education, Balance training, Joint mobilization, Spinal mobilization, Cryotherapy, and Moist heat.  PLAN FOR NEXT SESSION:  Nustep, lumbar and lower extremity strengthening, review and update HEP, and modalities as needed   James Davenport DELENA Gosling, PTA 07/26/2024, 8:36 AM   PHYSICAL THERAPY DISCHARGE SUMMARY  Visits from Start of Care: 10.  Current functional level related to goals / functional outcomes: See above.     Remaining deficits: See goal section.   Education / Equipment: HEP.   Patient agrees to discharge. Patient goals were partially met. Patient is being discharged due to being pleased with the current functional level.    Chad Applegate MPT

## 2024-08-12 ENCOUNTER — Other Ambulatory Visit: Payer: Self-pay | Admitting: *Deleted

## 2024-08-12 DIAGNOSIS — R252 Cramp and spasm: Secondary | ICD-10-CM

## 2024-08-12 DIAGNOSIS — G8929 Other chronic pain: Secondary | ICD-10-CM

## 2024-08-20 ENCOUNTER — Other Ambulatory Visit: Payer: Self-pay | Admitting: *Deleted

## 2024-08-20 DIAGNOSIS — M5416 Radiculopathy, lumbar region: Secondary | ICD-10-CM

## 2024-08-22 ENCOUNTER — Other Ambulatory Visit: Payer: Self-pay | Admitting: *Deleted

## 2024-08-22 DIAGNOSIS — Z91030 Bee allergy status: Secondary | ICD-10-CM

## 2024-09-10 ENCOUNTER — Other Ambulatory Visit: Payer: Self-pay | Admitting: Family Medicine

## 2024-09-10 DIAGNOSIS — I10 Essential (primary) hypertension: Secondary | ICD-10-CM

## 2024-10-18 ENCOUNTER — Encounter: Payer: Self-pay | Admitting: Family Medicine

## 2024-10-18 ENCOUNTER — Ambulatory Visit (INDEPENDENT_AMBULATORY_CARE_PROVIDER_SITE_OTHER): Admitting: Family Medicine

## 2024-10-18 VITALS — BP 132/72 | HR 81 | Temp 97.3°F | Ht 69.0 in | Wt 218.6 lb

## 2024-10-18 DIAGNOSIS — R0602 Shortness of breath: Secondary | ICD-10-CM | POA: Diagnosis not present

## 2024-10-18 DIAGNOSIS — L219 Seborrheic dermatitis, unspecified: Secondary | ICD-10-CM

## 2024-10-18 DIAGNOSIS — I10 Essential (primary) hypertension: Secondary | ICD-10-CM

## 2024-10-18 DIAGNOSIS — J309 Allergic rhinitis, unspecified: Secondary | ICD-10-CM | POA: Insufficient documentation

## 2024-10-18 DIAGNOSIS — M5416 Radiculopathy, lumbar region: Secondary | ICD-10-CM | POA: Insufficient documentation

## 2024-10-18 DIAGNOSIS — K219 Gastro-esophageal reflux disease without esophagitis: Secondary | ICD-10-CM

## 2024-10-18 DIAGNOSIS — E785 Hyperlipidemia, unspecified: Secondary | ICD-10-CM | POA: Diagnosis not present

## 2024-10-18 DIAGNOSIS — Z91030 Bee allergy status: Secondary | ICD-10-CM

## 2024-10-18 DIAGNOSIS — R5383 Other fatigue: Secondary | ICD-10-CM

## 2024-10-18 MED ORDER — MELOXICAM 15 MG PO TABS: 15.0000 mg | ORAL_TABLET | Freq: Every day | ORAL | 1 refills | Status: AC

## 2024-10-18 MED ORDER — MONTELUKAST SODIUM 10 MG PO TABS: 10.0000 mg | ORAL_TABLET | Freq: Every day | ORAL | 3 refills | Status: AC

## 2024-10-18 MED ORDER — PANTOPRAZOLE SODIUM 40 MG PO TBEC: 40.0000 mg | DELAYED_RELEASE_TABLET | Freq: Two times a day (BID) | ORAL | 3 refills | Status: AC

## 2024-10-18 MED ORDER — GABAPENTIN 300 MG PO CAPS: 600.0000 mg | ORAL_CAPSULE | Freq: Two times a day (BID) | ORAL | 1 refills | Status: AC

## 2024-10-18 MED ORDER — FLUTICASONE PROPIONATE 50 MCG/ACT NA SUSP: 2.0000 | Freq: Every day | NASAL | 3 refills | Status: AC

## 2024-10-18 MED ORDER — VALSARTAN 80 MG PO TABS: 80.0000 mg | ORAL_TABLET | Freq: Every day | ORAL | 3 refills | Status: AC

## 2024-10-18 MED ORDER — NEXLIZET 180-10 MG PO TABS: 1.0000 | ORAL_TABLET | Freq: Every day | ORAL | 1 refills | Status: AC

## 2024-10-18 MED ORDER — EPINEPHRINE 0.3 MG/0.3ML IJ SOAJ: 0.3000 mg | INTRAMUSCULAR | 1 refills | Status: AC | PRN

## 2024-10-18 MED ORDER — KETOCONAZOLE 2 % EX CREA: 1.0000 | TOPICAL_CREAM | Freq: Every day | CUTANEOUS | 0 refills | Status: AC

## 2024-10-18 MED ORDER — KETOCONAZOLE 2 % EX SHAM: MEDICATED_SHAMPOO | CUTANEOUS | 11 refills | Status: AC

## 2024-10-18 MED ORDER — CYCLOBENZAPRINE HCL 10 MG PO TABS: ORAL_TABLET | ORAL | 11 refills | Status: AC

## 2024-10-18 MED ORDER — ALBUTEROL SULFATE HFA 108 (90 BASE) MCG/ACT IN AERS: 2.0000 | INHALATION_SPRAY | Freq: Four times a day (QID) | RESPIRATORY_TRACT | 3 refills | Status: AC | PRN

## 2024-10-18 MED ORDER — SPIRIVA RESPIMAT 1.25 MCG/ACT IN AERS: 2.0000 | INHALATION_SPRAY | Freq: Every day | RESPIRATORY_TRACT | 3 refills | Status: AC

## 2024-10-18 MED ORDER — LEVOCETIRIZINE DIHYDROCHLORIDE 5 MG PO TABS: 5.0000 mg | ORAL_TABLET | Freq: Every evening | ORAL | 3 refills | Status: AC

## 2024-10-18 NOTE — Progress Notes (Signed)
 "  Established Patient Office Visit  Subjective   [name], male    DOB: 16-Dec-1955  Age: 69 y.o. MRN: [id number]  Chief Complaint  Patient presents with   Medical Management of Chronic Issues    HPI  History of Present Illness   James Davenport is a 69 year old male who presents with decreased energy and ear fullness.  Fatigue - Decreased energy levels, ongoing for years - Initially attributed symptoms to aging - Increased concern after son-in-law diagnosed with low testosterone  - Interested in evaluation for possible hypogonadism  Ear fullness and eustachian tube dysfunction - Bilateral ear fullness and popping sensation for at least one month - Describes sensation as similar to descending a mountain - No ear pain - Some intermittent nasal congestion  Allergic rhinitis - History of allergies - Uses Xyzal  for two years and Flonase  nasal spray - Notices difference if Flonase  is not used - Questions possible tolerance to Xyzal  - Uses inhalers for shortness of breath primarily before exercise  Gastroesophageal reflux disease - Acid reflux well-controlled with Protonix  twice daily - Occasionally skips night dose without issues  Dermatologic care - Uses prescribed shampoo and cream with good effect     Back pain - Stable.  - No red flags    ROS As per HPI.    Objective:     BP 132/72   Pulse 81   Temp (!) 97.3 F (36.3 C) (Temporal)   Ht 5' 9 (1.753 m)   Wt 218 lb 9.6 oz (99.2 kg)   SpO2 94%   BMI 32.28 kg/m  Wt Readings from Last 3 Encounters:  10/18/24 218 lb 9.6 oz (99.2 kg)  05/20/24 211 lb 12.8 oz (96.1 kg)  04/08/24 207 lb (93.9 kg)      Physical Exam Vitals and nursing note reviewed.  Constitutional:      General: He is not in acute distress.    Appearance: He is obese. He is not ill-appearing, toxic-appearing or diaphoretic.  HENT:     Right Ear: Ear canal and external ear normal. A middle ear effusion is  present. Tympanic membrane is not perforated, erythematous, retracted or bulging.     Left Ear: Ear canal and external ear normal. A middle ear effusion is present. Tympanic membrane is not perforated, erythematous, retracted or bulging.     Nose: Congestion present.     Mouth/Throat:     Mouth: Mucous membranes are moist.     Pharynx: Oropharynx is clear. No oropharyngeal exudate or posterior oropharyngeal erythema.  Eyes:     General:        Right eye: No discharge.        Left eye: No discharge.     Conjunctiva/sclera: Conjunctivae normal.  Cardiovascular:     Rate and Rhythm: Normal rate and regular rhythm.     Heart sounds: Normal heart sounds. No murmur heard. Pulmonary:     Effort: Pulmonary effort is normal. No respiratory distress.     Breath sounds: Normal breath sounds. No wheezing.  Musculoskeletal:     Right lower leg: No edema.     Left lower leg: No edema.  Skin:    General: Skin is warm and dry.  Neurological:     General: No focal deficit present.     Mental Status: He is alert and oriented to person, place, and time.  Psychiatric:        Mood and Affect: Mood normal.  Behavior: Behavior normal.      No results found for any visits on 10/18/24.    The 10-year ASCVD risk score (Arnett DK, et al., 2019) is: 18.9%    Assessment & Plan:   James Davenport was seen today for medical management of chronic issues.  Diagnoses and all orders for this visit:  Primary hypertension -     valsartan  (DIOVAN ) 80 MG tablet; Take 1 tablet (80 mg total) by mouth daily.  Low energy -     Testosterone ,Free and Total  Chronic allergic rhinitis -     montelukast  (SINGULAIR ) 10 MG tablet; Take 1 tablet (10 mg total) by mouth at bedtime. -     fluticasone  (FLONASE ) 50 MCG/ACT nasal spray; Place 2 sprays into both nostrils daily. -     levocetirizine (XYZAL ) 5 MG tablet; Take 1 tablet (5 mg total) by mouth every evening.  Hyperlipidemia LDL goal <100 -      Bempedoic Acid-Ezetimibe  (NEXLIZET ) 180-10 MG TABS; Take 1 tablet by mouth daily.  Bee sting allergy -     EPINEPHrine  0.3 mg/0.3 mL IJ SOAJ injection; Inject 0.3 mg into the muscle as needed for anaphylaxis.  Lumbar radiculopathy -     cyclobenzaprine  (FLEXERIL ) 10 MG tablet; TAKE 1 TABLET EVERY DAY AS NEEDED FOR MUSCLE SPASM(S) -     gabapentin  (NEURONTIN ) 300 MG capsule; Take 2 capsules (600 mg total) by mouth 2 (two) times daily. -     meloxicam  (MOBIC ) 15 MG tablet; Take 1 tablet (15 mg total) by mouth daily.  Seborrheic dermatitis -     ketoconazole  (NIZORAL ) 2 % cream; Apply 1 Application topically daily. -     ketoconazole  (NIZORAL ) 2 % shampoo; APPLY TOPICALLY 2 TIMES A WEEK. LEAVE ON FOR 3 TO 5 MINUTES BEFORE RINSING.  Gastroesophageal reflux disease, unspecified whether esophagitis present -     pantoprazole  (PROTONIX ) 40 MG tablet; Take 1 tablet (40 mg total) by mouth 2 (two) times daily before a meal.  Shortness of breath -     albuterol  (VENTOLIN  HFA) 108 (90 Base) MCG/ACT inhaler; Inhale 2 puffs into the lungs every 6 (six) hours as needed. -     Tiotropium Bromide (SPIRIVA  RESPIMAT) 1.25 MCG/ACT AERS; Inhale 2 puffs into the lungs daily. INHALE 2 PUFFS ONE TIME DAILY   Assessment and Plan    Low energy.  Decreased energy levels. Requesting testosterone  lab.  - Ordered testosterone  level test before 10 AM today.  Chronic allergic rhinitis with eustachian tube dysfunction Fullness and popping in ears with fluid but no infection. Singular may help. - Prescribed Singulair  at bedtime. - Continue Flonase  and xyzal  - Monitor for ear pain or fever.  Lumbar radiculopathy Stable. He has completed PT again. Managed with Tylenol . Can continue mobic  and flexeril  prn  Bee sting allergy - Refilled EpiPen .  Seborrheic dermatitis - Continue shampoo and cream as prescribed.  Gastroesophageal reflux disease Symptoms well-controlled with Protonix . Occasionally skips nighttime  dose without issues. - Continue Protonix  twice daily.  Primary HTN BP at goal.   HLD On nexlizet . Statin intolerant. Has CPE next month   Keep scheduled appt for CPE.   The patient indicates understanding of these issues and agrees with the plan.   Annabella CHRISTELLA Search, FNP "

## 2024-10-20 LAB — TESTOSTERONE,FREE AND TOTAL
Testosterone, Free: 4.4 pg/mL — AB (ref 6.6–18.1)
Testosterone: 295 ng/dL (ref 264–916)

## 2024-10-21 ENCOUNTER — Ambulatory Visit: Payer: Self-pay | Admitting: Family Medicine

## 2024-10-29 ENCOUNTER — Other Ambulatory Visit (HOSPITAL_COMMUNITY): Payer: Self-pay

## 2024-11-08 ENCOUNTER — Telehealth: Payer: Self-pay | Admitting: Pharmacy Technician

## 2024-11-08 ENCOUNTER — Other Ambulatory Visit (HOSPITAL_COMMUNITY): Payer: Self-pay

## 2024-11-08 NOTE — Telephone Encounter (Signed)
 Pharmacy Patient Advocate Encounter  Received notification from [facility] that Prior Authorization for Nexlizet  180-10MG  tablets has been CANCELLED due to     PA #/Case ID/Reference #: [id number]  I called the insurance to obtain the previous denial letter as there was no record of an attempted PA in his chart. Please see denial reason below. Please note he will need an updated lipid panel completed before we can attempt a resubmission or appeal.

## 2024-11-08 NOTE — Telephone Encounter (Signed)
 Pharmacy Patient Advocate Encounter   Received notification from [facility] that prior authorization for Nexlizet  180-10MG  tablets is required/requested.   Insurance verification completed.   The patient is insured through [employer].   Per test claim: PA required; PA submitted to above mentioned insurance via Latent Key/confirmation #/EOC [id number] Status is pending

## 2024-11-22 ENCOUNTER — Encounter: Payer: Self-pay | Admitting: Family Medicine

## 2024-11-22 ENCOUNTER — Other Ambulatory Visit

## 2025-04-12 ENCOUNTER — Ambulatory Visit: Payer: Self-pay
